# Patient Record
Sex: Male | Born: 1990 | Race: White | Hispanic: No | Marital: Single | Smoking: Former smoker
Health system: Southern US, Community
[De-identification: ages and names within clinical notes are randomized; demographics above are authoritative.]

## PROBLEM LIST (undated history)

## (undated) DIAGNOSIS — F1910 Other psychoactive substance abuse, uncomplicated: Secondary | ICD-10-CM

## (undated) DIAGNOSIS — F419 Anxiety disorder, unspecified: Secondary | ICD-10-CM

## (undated) DIAGNOSIS — F329 Major depressive disorder, single episode, unspecified: Secondary | ICD-10-CM

## (undated) DIAGNOSIS — F32A Depression, unspecified: Secondary | ICD-10-CM

## (undated) DIAGNOSIS — F410 Panic disorder [episodic paroxysmal anxiety] without agoraphobia: Secondary | ICD-10-CM

## (undated) DIAGNOSIS — F1110 Opioid abuse, uncomplicated: Secondary | ICD-10-CM

## (undated) DIAGNOSIS — T79A0XA Compartment syndrome, unspecified, initial encounter: Secondary | ICD-10-CM

## (undated) HISTORY — PX: OTHER SURGICAL HISTORY: SHX169

---
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
## 2009-05-21 ENCOUNTER — Emergency Department (HOSPITAL_COMMUNITY): Admission: EM | Admit: 2009-05-21 | Discharge: 2009-05-21 | Payer: Self-pay | Admitting: Emergency Medicine

## 2009-12-05 ENCOUNTER — Emergency Department (HOSPITAL_COMMUNITY): Admission: EM | Admit: 2009-12-05 | Discharge: 2009-12-06 | Payer: Self-pay | Admitting: Emergency Medicine

## 2011-01-08 LAB — CBC
Hemoglobin: 17.2 g/dL — ABNORMAL HIGH (ref 13.0–17.0)
MCHC: 34.8 g/dL (ref 30.0–36.0)
MCV: 90.8 fL (ref 78.0–100.0)
RBC: 5.44 MIL/uL (ref 4.22–5.81)
RDW: 13.1 % (ref 11.5–15.5)

## 2011-01-08 LAB — DIFFERENTIAL
Basophils Absolute: 0.2 10*3/uL — ABNORMAL HIGH (ref 0.0–0.1)
Basophils Relative: 2 % — ABNORMAL HIGH (ref 0–1)
Lymphocytes Relative: 25 % (ref 12–46)
Lymphs Abs: 2.8 10*3/uL (ref 0.7–4.0)
Monocytes Absolute: 0.8 10*3/uL (ref 0.1–1.0)
Neutro Abs: 7.3 10*3/uL (ref 1.7–7.7)
Neutrophils Relative %: 65 % (ref 43–77)

## 2011-01-08 LAB — RAPID URINE DRUG SCREEN, HOSP PERFORMED
Amphetamines: POSITIVE — AB
Cocaine: NOT DETECTED
Tetrahydrocannabinol: POSITIVE — AB

## 2011-01-08 LAB — BASIC METABOLIC PANEL
BUN: 11 mg/dL (ref 6–23)
Chloride: 103 mEq/L (ref 96–112)
Glucose, Bld: 100 mg/dL — ABNORMAL HIGH (ref 70–99)
Potassium: 3.8 mEq/L (ref 3.5–5.1)
Sodium: 139 mEq/L (ref 135–145)

## 2011-01-08 LAB — ETHANOL: Alcohol, Ethyl (B): <5 mg/dL (ref 0–10)

## 2011-02-11 ENCOUNTER — Emergency Department (HOSPITAL_COMMUNITY)
Admission: EM | Admit: 2011-02-11 | Discharge: 2011-02-11 | Disposition: A | Discharge status: Home / Self Care | Payer: BC Managed Care – PPO | Attending: Emergency Medicine | Admitting: Emergency Medicine

## 2011-02-11 DIAGNOSIS — F988 Other specified behavioral and emotional disorders with onset usually occurring in childhood and adolescence: Secondary | ICD-10-CM | POA: Insufficient documentation

## 2011-02-11 DIAGNOSIS — F411 Generalized anxiety disorder: Secondary | ICD-10-CM | POA: Insufficient documentation

## 2011-05-29 ENCOUNTER — Ambulatory Visit (INDEPENDENT_AMBULATORY_CARE_PROVIDER_SITE_OTHER): Payer: BC Managed Care – PPO

## 2011-05-29 ENCOUNTER — Inpatient Hospital Stay (INDEPENDENT_AMBULATORY_CARE_PROVIDER_SITE_OTHER)
Admission: RE | Admit: 2011-05-29 | Discharge: 2011-05-29 | Disposition: A | Discharge status: Home / Self Care | Payer: BC Managed Care – PPO | Source: Ambulatory Visit | Attending: Family Medicine | Admitting: Family Medicine

## 2011-05-29 DIAGNOSIS — L03119 Cellulitis of unspecified part of limb: Secondary | ICD-10-CM

## 2011-05-29 DIAGNOSIS — M545 Low back pain: Secondary | ICD-10-CM

## 2011-05-29 DIAGNOSIS — IMO0002 Reserved for concepts with insufficient information to code with codable children: Secondary | ICD-10-CM

## 2011-05-29 DIAGNOSIS — L02619 Cutaneous abscess of unspecified foot: Secondary | ICD-10-CM

## 2011-07-25 ENCOUNTER — Emergency Department (HOSPITAL_COMMUNITY): Payer: BC Managed Care – PPO

## 2011-07-25 ENCOUNTER — Emergency Department (HOSPITAL_COMMUNITY)
Admission: EM | Admit: 2011-07-25 | Discharge: 2011-07-25 | Disposition: A | Discharge status: Home / Self Care | Payer: BC Managed Care – PPO | Attending: Emergency Medicine | Admitting: Emergency Medicine

## 2011-07-25 DIAGNOSIS — S01501A Unspecified open wound of lip, initial encounter: Secondary | ICD-10-CM | POA: Insufficient documentation

## 2011-07-25 DIAGNOSIS — IMO0002 Reserved for concepts with insufficient information to code with codable children: Secondary | ICD-10-CM | POA: Insufficient documentation

## 2011-07-25 DIAGNOSIS — R51 Headache: Secondary | ICD-10-CM | POA: Insufficient documentation

## 2011-07-25 DIAGNOSIS — T1490XA Injury, unspecified, initial encounter: Secondary | ICD-10-CM | POA: Insufficient documentation

## 2011-07-25 DIAGNOSIS — R079 Chest pain, unspecified: Secondary | ICD-10-CM | POA: Insufficient documentation

## 2011-07-25 LAB — CBC
HCT: 45 % (ref 39.0–52.0)
Hemoglobin: 15.6 g/dL (ref 13.0–17.0)
MCV: 87.2 fL (ref 78.0–100.0)
RBC: 5.16 MIL/uL (ref 4.22–5.81)
RDW: 13.2 % (ref 11.5–15.5)
WBC: 9.6 10*3/uL (ref 4.0–10.5)

## 2011-09-21 ENCOUNTER — Encounter: Payer: Self-pay | Admitting: Emergency Medicine

## 2011-09-21 ENCOUNTER — Emergency Department (HOSPITAL_COMMUNITY)
Admission: EM | Admit: 2011-09-21 | Discharge: 2011-09-22 | Disposition: A | Discharge status: Home / Self Care | Payer: BC Managed Care – PPO | Attending: Emergency Medicine | Admitting: Emergency Medicine

## 2011-09-21 DIAGNOSIS — F1910 Other psychoactive substance abuse, uncomplicated: Secondary | ICD-10-CM | POA: Insufficient documentation

## 2011-09-21 DIAGNOSIS — F17200 Nicotine dependence, unspecified, uncomplicated: Secondary | ICD-10-CM | POA: Insufficient documentation

## 2011-09-21 HISTORY — DX: Major depressive disorder, single episode, unspecified: F32.9

## 2011-09-21 HISTORY — DX: Panic disorder (episodic paroxysmal anxiety): F41.0

## 2011-09-21 HISTORY — DX: Anxiety disorder, unspecified: F41.9

## 2011-09-21 HISTORY — DX: Depression, unspecified: F32.A

## 2011-09-21 LAB — CBC
HCT: 44.9 % (ref 39.0–52.0)
Hemoglobin: 16.1 g/dL (ref 13.0–17.0)
MCH: 30.1 pg (ref 26.0–34.0)
MCV: 83.9 fL (ref 78.0–100.0)
Platelets: 305 10*3/uL (ref 150–400)
RBC: 5.35 MIL/uL (ref 4.22–5.81)

## 2011-09-21 LAB — DIFFERENTIAL
Basophils Relative: 0 % (ref 0–1)
Eosinophils Absolute: 0.1 10*3/uL (ref 0.0–0.7)
Eosinophils Relative: 1 % (ref 0–5)
Neutro Abs: 9.2 10*3/uL — ABNORMAL HIGH (ref 1.7–7.7)

## 2011-09-21 LAB — URINALYSIS, ROUTINE W REFLEX MICROSCOPIC
Glucose, UA: NEGATIVE mg/dL
Hgb urine dipstick: NEGATIVE
Leukocytes, UA: NEGATIVE
Protein, ur: NEGATIVE mg/dL

## 2011-09-21 LAB — URINE MICROSCOPIC-ADD ON

## 2011-09-21 NOTE — ED Provider Notes (Signed)
History     CSN: [id number] Arrival date & time: 09/21/2011 10:36 PM   First MD Initiated Contact with Patient 09/21/11 2250      Chief Complaint  Patient presents with  . Medical Clearance    (Consider location/radiation/quality/duration/timing/severity/associated sxs/prior treatment) HPI Comments: Patient with a 6 month history of heroin use presents requesting detox. Patient states he uses 1 g of heroin per day, last use this morning.Marland Kitchen He also admits to shooting up cocaine, last use yesterday. Patient also uses marijuana and occasionally drinks alcohol. Patient describes nausea, vomiting, diarrhea, and flulike symptoms when withdrawing in the past. Patient denies suicidal or homicidal ideation currently. Denies any medical complaints.  Patient is a 20 y.o. male presenting with drug problem. The history is provided by the patient.  Drug Problem This is a chronic problem. The current episode started more than 1 month ago. The problem occurs daily. Pertinent negatives include no abdominal pain, chest pain, chills, congestion, fever, myalgias, nausea, neck pain, rash, sore throat or vomiting. The symptoms are aggravated by nothing. He has tried nothing for the symptoms.    Past Medical History  Diagnosis Date  . Depression   . Anxiety   . Panic attacks     Past Surgical History  Procedure Date  . Extraction of wisdom teeth     Family History  Problem Relation Age of Onset  . Hypertension Father     History  Substance Use Topics  . Smoking status: Current Everyday Smoker -- 1.0 packs/day    Types: Cigarettes  . Smokeless tobacco: Not on file  . Alcohol Use: Yes     rare      Review of Systems  Constitutional: Negative for fever and chills.  HENT: Negative for congestion, sore throat, rhinorrhea and neck pain.   Eyes: Negative for redness.  Respiratory: Negative for shortness of breath.   Cardiovascular: Negative for chest pain.  Gastrointestinal: Negative for  nausea, vomiting, abdominal pain, diarrhea and constipation.  Genitourinary: Negative for dysuria.  Musculoskeletal: Negative for myalgias.  Skin: Negative for rash.  Neurological: Negative for dizziness.  Psychiatric/Behavioral: Negative for suicidal ideas.    Allergies  Review of patient's allergies indicates no known allergies.  Home Medications  No current outpatient prescriptions on file.  BP 131/78  Pulse 100  Temp(Src) 98.2 F (36.8 C) (Oral)  Resp 20  SpO2 100%  Physical Exam  Nursing note and vitals reviewed. Constitutional: He is oriented to person, place, and time. He appears well-developed and well-nourished.  HENT:  Head: Normocephalic and atraumatic.  Eyes: Conjunctivae are normal. Right eye exhibits no discharge. Left eye exhibits no discharge.  Neck: Normal range of motion. Neck supple.  Cardiovascular: Normal rate, regular rhythm and normal heart sounds.   Pulmonary/Chest: Effort normal and breath sounds normal.  Abdominal: Soft. Bowel sounds are normal. There is no tenderness. There is no rebound and no guarding.  Musculoskeletal: He exhibits no edema.       Multiple needle marks on bilateral upper extremities.  Neurological: He is alert and oriented to person, place, and time.  Skin: Skin is warm and dry.  Psychiatric: He has a normal mood and affect.    ED Course  Procedures (including critical care time)  Labs Reviewed  CBC - Abnormal; Notable for the following:    WBC 12.9 (*)    All other components within normal limits  DIFFERENTIAL - Abnormal; Notable for the following:    Neutro Abs 9.2 (*)  Monocytes Absolute 1.2 (*)    All other components within normal limits  URINE RAPID DRUG SCREEN (HOSP PERFORMED) - Abnormal; Notable for the following:    Cocaine POSITIVE (*)    Tetrahydrocannabinol POSITIVE (*)    All other components within normal limits  URINALYSIS, ROUTINE W REFLEX MICROSCOPIC - Abnormal; Notable for the following:     APPearance TURBID (*)    All other components within normal limits  BASIC METABOLIC PANEL  ETHANOL  URINE MICROSCOPIC-ADD ON   No results found.   1. Polysubstance abuse     11:28 PM Patient seen and examined. Workup initiated.  1:10 AM patient informed of results. I've spoken with the ACT who will see patient.    MDM  Heroin and ETOH abuse. Medically cleared.         Eustace Moore [place], [place] 09/22/11 [phone]

## 2011-09-21 NOTE — ED Notes (Signed)
Pt states he is addicted to heroine  Pt states he has been using for about 6 months  Last used this morning  Pt states he has a slight headache  Pt denies SI/HI at this time

## 2011-09-22 DIAGNOSIS — F1910 Other psychoactive substance abuse, uncomplicated: Secondary | ICD-10-CM

## 2011-09-22 LAB — BASIC METABOLIC PANEL
Creatinine, Ser: 0.93 mg/dL (ref 0.50–1.35)
GFR calc Af Amer: >90 mL/min (ref >90)
Glucose, Bld: 91 mg/dL (ref 70–99)
Potassium: 3.7 mEq/L (ref 3.5–5.1)

## 2011-09-22 LAB — RAPID URINE DRUG SCREEN, HOSP PERFORMED: Opiates: NOT DETECTED

## 2011-09-22 MED ORDER — NICOTINE 21 MG/24HR TD PT24: 21.0000 mg | MEDICATED_PATCH | Freq: Every day | TRANSDERMAL | Status: DC

## 2011-09-22 MED ORDER — IBUPROFEN 600 MG PO TABS
600.0000 mg | ORAL_TABLET | Freq: Three times a day (TID) | ORAL | Status: DC | PRN
  Administered 2011-09-22: 600 mg via ORAL
  Filled 2011-09-22: qty 1

## 2011-09-22 MED ORDER — ONDANSETRON 4 MG PO TBDP
4.0000 mg | ORAL_TABLET | Freq: Four times a day (QID) | ORAL | Status: DC | PRN
  Administered 2011-09-22: 4 mg via ORAL
  Filled 2011-09-22: qty 1

## 2011-09-22 MED ORDER — ALUM & MAG HYDROXIDE-SIMETH 200-200-20 MG/5ML PO SUSP: 30.0000 mL | ORAL | Status: DC | PRN

## 2011-09-22 MED ORDER — CLONIDINE HCL 0.1 MG PO TABS: 0.1000 mg | ORAL_TABLET | ORAL | Status: DC

## 2011-09-22 MED ORDER — LOPERAMIDE HCL 2 MG PO CAPS: 2.0000 mg | ORAL_CAPSULE | ORAL | Status: DC | PRN

## 2011-09-22 MED ORDER — HYDROXYZINE HCL 25 MG PO TABS
25.0000 mg | ORAL_TABLET | Freq: Four times a day (QID) | ORAL | Status: DC | PRN
  Administered 2011-09-22 (×2): 25 mg via ORAL
  Filled 2011-09-22 (×2): qty 1

## 2011-09-22 MED ORDER — CLONIDINE HCL 0.1 MG PO TABS: 0.1000 mg | ORAL_TABLET | Freq: Every day | ORAL | Status: DC

## 2011-09-22 MED ORDER — METHOCARBAMOL 500 MG PO TABS
500.0000 mg | ORAL_TABLET | Freq: Three times a day (TID) | ORAL | Status: DC | PRN
  Filled 2011-09-22: qty 1

## 2011-09-22 MED ORDER — DICYCLOMINE HCL 20 MG PO TABS
20.0000 mg | ORAL_TABLET | ORAL | Status: DC | PRN
  Administered 2011-09-22: 20 mg via ORAL
  Filled 2011-09-22: qty 1

## 2011-09-22 MED ORDER — CLONIDINE HCL 0.1 MG PO TABS: 0.1000 mg | ORAL_TABLET | Freq: Four times a day (QID) | ORAL | Status: DC

## 2011-09-22 MED ORDER — ACETAMINOPHEN 325 MG PO TABS: 650.0000 mg | ORAL_TABLET | ORAL | Status: DC | PRN

## 2011-09-22 MED ORDER — LORAZEPAM 1 MG PO TABS: 1.0000 mg | ORAL_TABLET | Freq: Three times a day (TID) | ORAL | Status: DC | PRN

## 2011-09-22 MED ORDER — NAPROXEN 500 MG PO TABS
500.0000 mg | ORAL_TABLET | Freq: Two times a day (BID) | ORAL | Status: DC | PRN
  Administered 2011-09-22: 500 mg via ORAL
  Filled 2011-09-22: qty 1

## 2011-09-22 NOTE — ED Notes (Signed)
Pt homeless, living in car, drove self to ED to get help with Heroin detox, states he has been using for 6 months, amount varies and he wants to quit, reports first going across the street to the [facility] but sent over her to the ED d/t there were not any beds. Pt denies previous psych hx/detox tx, denies SI/HI/AH/VH, also reports using cocaine, MJ and benzos, pt med focused requesting suboxone and ativan, reports generalized pain 9/10, bentyl, naproxen, vistaril administered will f/u on effect.

## 2011-09-22 NOTE — Consult Note (Signed)
Patient Identification:  Martin Chapman Date of Evaluation:  09/22/2011   History of Present Illness:   I saw the patient and reviewed the [facility] assessment. 20 year old Caucasian male with history of polysubstance dependence abusing opioids cocaine marijuana. Patient wants to be on a Suboxone he don't want to get detox. I discussed with him that he has to go to the facility in the outpatient setting to be on a Suboxone. Patient also don't want to go to any rehabilitation at this time patient is very logical and goal-directed during the interview not hallucinating or delusional not suicidal or homicidal. Patient is alert awake oriented x3 attention concentration fair abstract ability fair insight and judgment intact. Patient wants to be discharged to followup in the outpatient clinic at this time patient does not meet criteria to be committed on IVC.  Psychoeducation given to the patient and I told her counselor to give him all the options available in the outpatient setting.   Past Medical History:     Past Medical History  Diagnosis Date  . Depression   . Anxiety   . Panic attacks        Past Surgical History  Procedure Date  . Extraction of wisdom teeth     Filed Vitals:   09/22/11 0842  BP: 104/60  Pulse: 66  Temp:   Resp:     Lab Results:   BMET    Component Value Date/Time   NA 140 09/21/2011 2315   K 3.7 09/21/2011 2315   CL 103 09/21/2011 2315   CO2 27 09/21/2011 2315   GLUCOSE 91 09/21/2011 2315   BUN 11 09/21/2011 2315   CREATININE 0.93 09/21/2011 2315   CALCIUM 9.8 09/21/2011 2315   GFRNONAA >90 09/21/2011 2315   GFRAA >90 09/21/2011 2315    Allergies: No Known Allergies  Current Medications:  Prior to Admission medications   Not on File    Social History:    reports that he has been smoking Cigarettes.  He has been smoking about 1 pack per day. He does not have any smokeless tobacco history on file. He reports that he drinks alcohol. He  reports that he uses illicit drugs (Cocaine).   Family History:    Family History  Problem Relation Age of Onset  . Hypertension Father      DIAGNOSIS:   AXIS I  polysubstance dependence   AXIS II  Deffered  AXIS III See medical notes.  AXIS IV  substance abuse   AXIS V 50     Recommendations:  Patient can be discharged to followup in the outpatient setting.   Eulogio Ditch, MD

## 2011-09-22 NOTE — ED Notes (Signed)
Pt in room, with pants off, noticed he still had his boxer shorts on, pt advised of the rules that he should not have any of his belongings in his room, pt cooperative and gave staff boxer shorts to place with other belongings.

## 2011-09-22 NOTE — ED Provider Notes (Signed)
9:08 AM Patient has been seen and cleared by the psychiatrist, Dr. Rogers Blocker. He is given outpatient referrals. He has no suicidal or homicidal thoughts. Patient was here voluntarily. He is stable for discharge at this time  Theron Arista A. Patrica Duel, MD 09/22/11 [phone]
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
## 2011-09-23 NOTE — ED Provider Notes (Signed)
Medical screening examination/treatment/procedure(s) were performed by non-physician practitioner and as supervising physician I was immediately available for consultation/collaboration.   Vida Roller, MD 09/23/11 [phone]

## 2012-04-08 ENCOUNTER — Emergency Department (HOSPITAL_COMMUNITY): Payer: BC Managed Care – PPO

## 2012-04-08 ENCOUNTER — Encounter (HOSPITAL_COMMUNITY): Payer: Self-pay | Admitting: Emergency Medicine

## 2012-04-08 ENCOUNTER — Emergency Department (HOSPITAL_COMMUNITY)
Admission: EM | Admit: 2012-04-08 | Discharge: 2012-04-09 | Disposition: A | Discharge status: Home / Self Care | Payer: BC Managed Care – PPO | Attending: Emergency Medicine | Admitting: Emergency Medicine

## 2012-04-08 DIAGNOSIS — T07XXXA Unspecified multiple injuries, initial encounter: Secondary | ICD-10-CM

## 2012-04-08 DIAGNOSIS — F17200 Nicotine dependence, unspecified, uncomplicated: Secondary | ICD-10-CM | POA: Insufficient documentation

## 2012-04-08 DIAGNOSIS — S060X9A Concussion with loss of consciousness of unspecified duration, initial encounter: Secondary | ICD-10-CM

## 2012-04-08 DIAGNOSIS — M542 Cervicalgia: Secondary | ICD-10-CM | POA: Insufficient documentation

## 2012-04-08 DIAGNOSIS — Y9289 Other specified places as the place of occurrence of the external cause: Secondary | ICD-10-CM | POA: Insufficient documentation

## 2012-04-08 DIAGNOSIS — S060X0A Concussion without loss of consciousness, initial encounter: Secondary | ICD-10-CM | POA: Insufficient documentation

## 2012-04-08 DIAGNOSIS — M25559 Pain in unspecified hip: Secondary | ICD-10-CM | POA: Insufficient documentation

## 2012-04-08 DIAGNOSIS — M25569 Pain in unspecified knee: Secondary | ICD-10-CM | POA: Insufficient documentation

## 2012-04-08 DIAGNOSIS — R1011 Right upper quadrant pain: Secondary | ICD-10-CM | POA: Insufficient documentation

## 2012-04-08 DIAGNOSIS — F341 Dysthymic disorder: Secondary | ICD-10-CM | POA: Insufficient documentation

## 2012-04-08 DIAGNOSIS — R51 Headache: Secondary | ICD-10-CM | POA: Insufficient documentation

## 2012-04-08 LAB — COMPREHENSIVE METABOLIC PANEL
BUN: 13 mg/dL (ref 6–23)
Calcium: 9.9 mg/dL (ref 8.4–10.5)
GFR calc Af Amer: >90 mL/min (ref >90)
Glucose, Bld: 118 mg/dL — ABNORMAL HIGH (ref 70–99)
Total Protein: 7.7 g/dL (ref 6.0–8.3)

## 2012-04-08 LAB — CBC WITH DIFFERENTIAL/PLATELET
Basophils Relative: 0 % (ref 0–1)
Eosinophils Relative: 0 % (ref 0–5)
Hemoglobin: 15.9 g/dL (ref 13.0–17.0)
Lymphs Abs: 2.3 10*3/uL (ref 0.7–4.0)
MCH: 29.5 pg (ref 26.0–34.0)
MCV: 84.8 fL (ref 78.0–100.0)
Monocytes Absolute: 1.5 10*3/uL — ABNORMAL HIGH (ref 0.1–1.0)
RBC: 5.39 MIL/uL (ref 4.22–5.81)

## 2012-04-08 LAB — RAPID URINE DRUG SCREEN, HOSP PERFORMED
Barbiturates: NOT DETECTED
Tetrahydrocannabinol: POSITIVE — AB

## 2012-04-08 LAB — LIPASE, BLOOD: Lipase: 14 U/L (ref 11–59)

## 2012-04-08 LAB — URINALYSIS, ROUTINE W REFLEX MICROSCOPIC
Bilirubin Urine: NEGATIVE
Nitrite: NEGATIVE
Specific Gravity, Urine: >1.046 — ABNORMAL HIGH (ref 1.005–1.030)
Urobilinogen, UA: 0.2 mg/dL (ref 0.0–1.0)

## 2012-04-08 LAB — ETHANOL: Alcohol, Ethyl (B): <11 mg/dL (ref 0–11)

## 2012-04-08 MED ORDER — FENTANYL CITRATE 0.05 MG/ML IJ SOLN
100.0000 ug | Freq: Once | INTRAMUSCULAR | Status: AC
  Administered 2012-04-08: 100 ug via INTRAVENOUS
  Filled 2012-04-08: qty 2

## 2012-04-08 MED ORDER — IOHEXOL 300 MG/ML  SOLN
100.0000 mL | Freq: Once | INTRAMUSCULAR | Status: AC | PRN
  Administered 2012-04-08: 100 mL via INTRAVENOUS

## 2012-04-08 MED ORDER — SODIUM CHLORIDE 0.9 % IV SOLN: INTRAVENOUS | Status: DC

## 2012-04-08 MED ORDER — SODIUM CHLORIDE 0.9 % IV BOLUS (SEPSIS)
1000.0000 mL | Freq: Once | INTRAVENOUS | Status: AC
  Administered 2012-04-08: 1000 mL via INTRAVENOUS

## 2012-04-08 NOTE — ED Notes (Signed)
Pt returned from CT, NT at bedside, attempting to help pt urinate in urinal. Pt medicated for pain per MD order, family at bedside.

## 2012-04-08 NOTE — ED Notes (Signed)
Pt requesting addt pain medication and food
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
## 2012-04-08 NOTE — ED Notes (Signed)
Father at bedside.

## 2012-04-08 NOTE — ED Notes (Signed)
Pt remains in CT at this time.  
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
## 2012-07-22 ENCOUNTER — Emergency Department (HOSPITAL_COMMUNITY): Payer: BC Managed Care – PPO

## 2012-07-22 ENCOUNTER — Inpatient Hospital Stay (HOSPITAL_COMMUNITY): Payer: BC Managed Care – PPO | Admitting: Anesthesiology

## 2012-07-22 ENCOUNTER — Encounter (HOSPITAL_COMMUNITY): Payer: Self-pay | Admitting: Anesthesiology

## 2012-07-22 ENCOUNTER — Inpatient Hospital Stay (HOSPITAL_COMMUNITY)
Admission: EM | Admit: 2012-07-22 | Discharge: 2012-08-20 | DRG: 583 | Disposition: A | Discharge status: Home / Self Care | Payer: BC Managed Care – PPO | Attending: Internal Medicine | Admitting: Internal Medicine

## 2012-07-22 ENCOUNTER — Encounter (HOSPITAL_COMMUNITY)
Admission: EM | Disposition: A | Discharge status: Home / Self Care | Payer: Self-pay | Source: Home / Self Care | Attending: Internal Medicine

## 2012-07-22 DIAGNOSIS — T510X1A Toxic effect of ethanol, accidental (unintentional), initial encounter: Secondary | ICD-10-CM | POA: Diagnosis present

## 2012-07-22 DIAGNOSIS — G9340 Encephalopathy, unspecified: Secondary | ICD-10-CM | POA: Diagnosis present

## 2012-07-22 DIAGNOSIS — F17200 Nicotine dependence, unspecified, uncomplicated: Secondary | ICD-10-CM | POA: Diagnosis present

## 2012-07-22 DIAGNOSIS — T794XXA Traumatic shock, initial encounter: Secondary | ICD-10-CM | POA: Diagnosis present

## 2012-07-22 DIAGNOSIS — F1010 Alcohol abuse, uncomplicated: Secondary | ICD-10-CM | POA: Diagnosis present

## 2012-07-22 DIAGNOSIS — F11929 Opioid use, unspecified with intoxication, unspecified: Secondary | ICD-10-CM

## 2012-07-22 DIAGNOSIS — D649 Anemia, unspecified: Secondary | ICD-10-CM | POA: Diagnosis not present

## 2012-07-22 DIAGNOSIS — R7989 Other specified abnormal findings of blood chemistry: Secondary | ICD-10-CM

## 2012-07-22 DIAGNOSIS — M6282 Rhabdomyolysis: Secondary | ICD-10-CM | POA: Diagnosis present

## 2012-07-22 DIAGNOSIS — S20219A Contusion of unspecified front wall of thorax, initial encounter: Secondary | ICD-10-CM | POA: Diagnosis present

## 2012-07-22 DIAGNOSIS — R6520 Severe sepsis without septic shock: Secondary | ICD-10-CM | POA: Diagnosis present

## 2012-07-22 DIAGNOSIS — R7309 Other abnormal glucose: Secondary | ICD-10-CM | POA: Diagnosis not present

## 2012-07-22 DIAGNOSIS — X58XXXA Exposure to other specified factors, initial encounter: Secondary | ICD-10-CM | POA: Diagnosis present

## 2012-07-22 DIAGNOSIS — IMO0002 Reserved for concepts with insufficient information to code with codable children: Secondary | ICD-10-CM | POA: Diagnosis not present

## 2012-07-22 DIAGNOSIS — T79A19A Traumatic compartment syndrome of unspecified upper extremity, initial encounter: Secondary | ICD-10-CM

## 2012-07-22 DIAGNOSIS — T17800A Unspecified foreign body in other parts of respiratory tract causing asphyxiation, initial encounter: Secondary | ICD-10-CM

## 2012-07-22 DIAGNOSIS — I10 Essential (primary) hypertension: Secondary | ICD-10-CM | POA: Diagnosis present

## 2012-07-22 DIAGNOSIS — G929 Unspecified toxic encephalopathy: Secondary | ICD-10-CM | POA: Diagnosis present

## 2012-07-22 DIAGNOSIS — T401X1A Poisoning by heroin, accidental (unintentional), initial encounter: Secondary | ICD-10-CM | POA: Diagnosis present

## 2012-07-22 DIAGNOSIS — E872 Acidosis: Secondary | ICD-10-CM

## 2012-07-22 DIAGNOSIS — E162 Hypoglycemia, unspecified: Secondary | ICD-10-CM

## 2012-07-22 DIAGNOSIS — Y929 Unspecified place or not applicable: Secondary | ICD-10-CM

## 2012-07-22 DIAGNOSIS — F919 Conduct disorder, unspecified: Secondary | ICD-10-CM | POA: Diagnosis present

## 2012-07-22 DIAGNOSIS — S45809A Unspecified injury of other specified blood vessels at shoulder and upper arm level, unspecified arm, initial encounter: Secondary | ICD-10-CM | POA: Diagnosis present

## 2012-07-22 DIAGNOSIS — G92 Toxic encephalopathy: Secondary | ICD-10-CM | POA: Diagnosis present

## 2012-07-22 DIAGNOSIS — Z781 Physical restraint status: Secondary | ICD-10-CM | POA: Diagnosis not present

## 2012-07-22 DIAGNOSIS — R680 Hypothermia, not associated with low environmental temperature: Secondary | ICD-10-CM | POA: Diagnosis present

## 2012-07-22 DIAGNOSIS — I952 Hypotension due to drugs: Secondary | ICD-10-CM

## 2012-07-22 DIAGNOSIS — F1210 Cannabis abuse, uncomplicated: Secondary | ICD-10-CM | POA: Diagnosis present

## 2012-07-22 DIAGNOSIS — F1410 Cocaine abuse, uncomplicated: Secondary | ICD-10-CM | POA: Diagnosis present

## 2012-07-22 DIAGNOSIS — A0472 Enterocolitis due to Clostridium difficile, not specified as recurrent: Secondary | ICD-10-CM | POA: Diagnosis not present

## 2012-07-22 DIAGNOSIS — T510X4A Toxic effect of ethanol, undetermined, initial encounter: Secondary | ICD-10-CM | POA: Diagnosis present

## 2012-07-22 DIAGNOSIS — T50911A Poisoning by multiple unspecified drugs, medicaments and biological substances, accidental (unintentional), initial encounter: Secondary | ICD-10-CM

## 2012-07-22 DIAGNOSIS — J690 Pneumonitis due to inhalation of food and vomit: Secondary | ICD-10-CM | POA: Diagnosis present

## 2012-07-22 DIAGNOSIS — R6521 Severe sepsis with septic shock: Secondary | ICD-10-CM | POA: Diagnosis present

## 2012-07-22 DIAGNOSIS — R4182 Altered mental status, unspecified: Secondary | ICD-10-CM

## 2012-07-22 DIAGNOSIS — F410 Panic disorder [episodic paroxysmal anxiety] without agoraphobia: Secondary | ICD-10-CM | POA: Diagnosis present

## 2012-07-22 DIAGNOSIS — J95821 Acute postprocedural respiratory failure: Secondary | ICD-10-CM | POA: Diagnosis not present

## 2012-07-22 DIAGNOSIS — F10929 Alcohol use, unspecified with intoxication, unspecified: Secondary | ICD-10-CM

## 2012-07-22 DIAGNOSIS — A419 Sepsis, unspecified organism: Secondary | ICD-10-CM | POA: Diagnosis present

## 2012-07-22 DIAGNOSIS — F1510 Other stimulant abuse, uncomplicated: Secondary | ICD-10-CM | POA: Diagnosis present

## 2012-07-22 DIAGNOSIS — F1120 Opioid dependence, uncomplicated: Secondary | ICD-10-CM | POA: Diagnosis present

## 2012-07-22 DIAGNOSIS — D696 Thrombocytopenia, unspecified: Secondary | ICD-10-CM | POA: Diagnosis not present

## 2012-07-22 DIAGNOSIS — N170 Acute kidney failure with tubular necrosis: Secondary | ICD-10-CM | POA: Diagnosis present

## 2012-07-22 DIAGNOSIS — F39 Unspecified mood [affective] disorder: Secondary | ICD-10-CM | POA: Diagnosis present

## 2012-07-22 DIAGNOSIS — E875 Hyperkalemia: Secondary | ICD-10-CM | POA: Diagnosis present

## 2012-07-22 DIAGNOSIS — N179 Acute kidney failure, unspecified: Secondary | ICD-10-CM

## 2012-07-22 DIAGNOSIS — R748 Abnormal levels of other serum enzymes: Secondary | ICD-10-CM | POA: Diagnosis not present

## 2012-07-22 DIAGNOSIS — Z23 Encounter for immunization: Secondary | ICD-10-CM

## 2012-07-22 DIAGNOSIS — B370 Candidal stomatitis: Secondary | ICD-10-CM | POA: Diagnosis not present

## 2012-07-22 DIAGNOSIS — E876 Hypokalemia: Secondary | ICD-10-CM | POA: Diagnosis not present

## 2012-07-22 DIAGNOSIS — T401X4A Poisoning by heroin, undetermined, initial encounter: Principal | ICD-10-CM | POA: Diagnosis present

## 2012-07-22 DIAGNOSIS — W449XXA Unspecified foreign body entering into or through a natural orifice, initial encounter: Secondary | ICD-10-CM | POA: Diagnosis present

## 2012-07-22 DIAGNOSIS — K7200 Acute and subacute hepatic failure without coma: Secondary | ICD-10-CM | POA: Diagnosis present

## 2012-07-22 HISTORY — PX: INTRAOPERATIVE ARTERIOGRAM: SHX5157

## 2012-07-22 HISTORY — PX: FASCIOTOMY: SHX132

## 2012-07-22 HISTORY — PX: INSERTION OF DIALYSIS CATHETER: SHX1324

## 2012-07-22 LAB — CBC WITH DIFFERENTIAL/PLATELET
Basophils Relative: 0 % (ref 0–1)
Eosinophils Absolute: 0 10*3/uL (ref 0.0–0.7)
MCH: 30.3 pg (ref 26.0–34.0)
MCHC: 35.6 g/dL (ref 30.0–36.0)
Neutrophils Relative %: 87 % — ABNORMAL HIGH (ref 43–77)
Platelets: 232 10*3/uL (ref 150–400)
RBC: 5.21 MIL/uL (ref 4.22–5.81)

## 2012-07-22 LAB — COMPREHENSIVE METABOLIC PANEL
ALT: 462 U/L — ABNORMAL HIGH (ref 0–53)
ALT: 468 U/L — ABNORMAL HIGH (ref 0–53)
AST: 588 U/L — ABNORMAL HIGH (ref 0–37)
Albumin: 4.7 g/dL (ref 3.5–5.2)
Alkaline Phosphatase: 226 U/L — ABNORMAL HIGH (ref 39–117)
CO2: 14 mEq/L — ABNORMAL LOW (ref 19–32)
Calcium: 8.2 mg/dL — ABNORMAL LOW (ref 8.4–10.5)
Chloride: 100 mEq/L (ref 96–112)
Chloride: 101 mEq/L (ref 96–112)
Creatinine, Ser: 2.45 mg/dL — ABNORMAL HIGH (ref 0.50–1.35)
GFR calc Af Amer: 38 mL/min — ABNORMAL LOW (ref >90)
GFR calc non Af Amer: 35 mL/min — ABNORMAL LOW (ref >90)
Glucose, Bld: 45 mg/dL — ABNORMAL LOW (ref 70–99)
Potassium: >7.5 mEq/L (ref 3.5–5.1)
Sodium: 144 mEq/L (ref 135–145)
Sodium: 140 mEq/L (ref 135–145)
Total Bilirubin: 0.9 mg/dL (ref 0.3–1.2)
Total Bilirubin: 0.8 mg/dL (ref 0.3–1.2)
Total Protein: 6.5 g/dL (ref 6.0–8.3)

## 2012-07-22 LAB — PROTIME-INR
INR: 2.01 — ABNORMAL HIGH (ref 0.00–1.49)
Prothrombin Time: 22 seconds — ABNORMAL HIGH (ref 11.6–15.2)

## 2012-07-22 LAB — LACTIC ACID, PLASMA: Lactic Acid, Venous: 8.2 mmol/L — ABNORMAL HIGH (ref 0.5–2.2)

## 2012-07-22 LAB — RAPID URINE DRUG SCREEN, HOSP PERFORMED
Amphetamines: NOT DETECTED
Barbiturates: NOT DETECTED
Cocaine: NOT DETECTED
Opiates: POSITIVE — AB
Tetrahydrocannabinol: NOT DETECTED

## 2012-07-22 LAB — AMYLASE: Amylase: 312 U/L — ABNORMAL HIGH (ref 0–105)

## 2012-07-22 LAB — URINALYSIS, ROUTINE W REFLEX MICROSCOPIC
Glucose, UA: NEGATIVE mg/dL
Hgb urine dipstick: NEGATIVE
Leukocytes, UA: NEGATIVE
Specific Gravity, Urine: 1.007 (ref 1.005–1.030)
pH: 5.5 (ref 5.0–8.0)

## 2012-07-22 LAB — CBC
MCV: 85.4 fL (ref 78.0–100.0)
Platelets: 279 10*3/uL (ref 150–400)
RBC: 5.91 MIL/uL — ABNORMAL HIGH (ref 4.22–5.81)
RDW: 13.3 % (ref 11.5–15.5)
WBC: 37.8 10*3/uL — ABNORMAL HIGH (ref 4.0–10.5)

## 2012-07-22 LAB — GLUCOSE, CAPILLARY: Glucose-Capillary: 159 mg/dL — ABNORMAL HIGH (ref 70–99)

## 2012-07-22 LAB — POCT I-STAT 7, (LYTES, BLD GAS, ICA,H+H)
Acid-base deficit: 11 mmol/L — ABNORMAL HIGH (ref 0.0–2.0)
Bicarbonate: 17.7 mEq/L — ABNORMAL LOW (ref 20.0–24.0)
Calcium, Ion: 0.93 mmol/L — ABNORMAL LOW (ref 1.12–1.23)
HCT: 38 % — ABNORMAL LOW (ref 39.0–52.0)
O2 Saturation: 90 %
Sodium: 142 mEq/L (ref 135–145)

## 2012-07-22 LAB — CK TOTAL AND CKMB (NOT AT ARMC): Total CK: >50000 U/L — ABNORMAL HIGH (ref 7–232)

## 2012-07-22 LAB — TROPONIN I: Troponin I: 0.62 ng/mL (ref <0.30)

## 2012-07-22 SURGERY — INSERTION OF DIALYSIS CATHETER
Anesthesia: General | Site: Groin | Laterality: Right | Wound class: Clean

## 2012-07-22 MED ORDER — SODIUM BICARBONATE 8.4 % IV SOLN
INTRAVENOUS | Status: DC | PRN
  Administered 2012-07-22: 100 meq via INTRAVENOUS
  Administered 2012-07-22: 150 meq via INTRAVENOUS

## 2012-07-22 MED ORDER — BIOTENE DRY MOUTH MT LIQD
15.0000 mL | Freq: Four times a day (QID) | OROMUCOSAL | Status: DC
  Administered 2012-07-23 – 2012-07-29 (×26): 15 mL via OROMUCOSAL

## 2012-07-22 MED ORDER — ONDANSETRON HCL 4 MG/2ML IJ SOLN
INTRAMUSCULAR | Status: DC | PRN
  Administered 2012-07-22: 4 mg via INTRAVENOUS

## 2012-07-22 MED ORDER — HEPARIN SODIUM (PORCINE) 5000 UNIT/ML IJ SOLN: 5000.0000 [IU] | Freq: Three times a day (TID) | INTRAMUSCULAR | Status: DC

## 2012-07-22 MED ORDER — CHLORHEXIDINE GLUCONATE 0.12 % MT SOLN
15.0000 mL | Freq: Two times a day (BID) | OROMUCOSAL | Status: DC
  Administered 2012-07-23 – 2012-07-29 (×13): 15 mL via OROMUCOSAL
  Filled 2012-07-22 (×12): qty 15

## 2012-07-22 MED ORDER — CALCIUM CHLORIDE 10 % IV SOLN
INTRAVENOUS | Status: DC | PRN
  Administered 2012-07-22 (×2): 1 g via INTRAVENOUS

## 2012-07-22 MED ORDER — PANTOPRAZOLE SODIUM 40 MG IV SOLR
40.0000 mg | Freq: Every day | INTRAVENOUS | Status: DC
  Administered 2012-07-23 – 2012-07-24 (×2): 40 mg via INTRAVENOUS
  Filled 2012-07-22 (×4): qty 40

## 2012-07-22 MED ORDER — LIDOCAINE HCL (CARDIAC) 20 MG/ML IV SOLN
INTRAVENOUS | Status: DC | PRN
  Administered 2012-07-22: 100 mg via INTRAVENOUS

## 2012-07-22 MED ORDER — ONDANSETRON HCL 4 MG/2ML IJ SOLN
INTRAMUSCULAR | Status: AC
  Filled 2012-07-22: qty 2

## 2012-07-22 MED ORDER — DEXTROSE 5 % IV SOLN
INTRAVENOUS | Status: AC
  Filled 2012-07-22: qty 250

## 2012-07-22 MED ORDER — NALOXONE HCL 0.4 MG/ML IJ SOLN
0.4000 mg | Freq: Once | INTRAMUSCULAR | Status: AC
  Administered 2012-07-22: 0.4 mg via INTRAVENOUS
  Filled 2012-07-22 (×2): qty 1

## 2012-07-22 MED ORDER — NOREPINEPHRINE BITARTRATE 1 MG/ML IJ SOLN
2.0000 ug/min | INTRAVENOUS | Status: DC
  Filled 2012-07-22: qty 4

## 2012-07-22 MED ORDER — INSULIN ASPART 100 UNIT/ML ~~LOC~~ SOLN
10.0000 [IU] | Freq: Once | SUBCUTANEOUS | Status: DC
  Filled 2012-07-22: qty 3

## 2012-07-22 MED ORDER — DEXTROSE 50 % IV SOLN
INTRAVENOUS | Status: DC | PRN
  Administered 2012-07-22: 25 g via INTRAVENOUS

## 2012-07-22 MED ORDER — SODIUM CHLORIDE 0.9 % IV SOLN
INTRAVENOUS | Status: DC | PRN
  Administered 2012-07-22: 22:00:00 via INTRAVENOUS

## 2012-07-22 MED ORDER — ROCURONIUM BROMIDE 100 MG/10ML IV SOLN
INTRAVENOUS | Status: DC | PRN
  Administered 2012-07-22: 50 mg via INTRAVENOUS
  Administered 2012-07-22: 100 mg via INTRAVENOUS

## 2012-07-22 MED ORDER — DEXTROSE 50 % IV SOLN
1.0000 | Freq: Once | INTRAVENOUS | Status: AC
  Administered 2012-07-22: 50 mL via INTRAVENOUS
  Filled 2012-07-22: qty 50

## 2012-07-22 MED ORDER — ARTIFICIAL TEARS OP OINT
TOPICAL_OINTMENT | OPHTHALMIC | Status: DC | PRN
  Administered 2012-07-22: 1 via OPHTHALMIC

## 2012-07-22 MED ORDER — SODIUM CHLORIDE 0.9 % IV SOLN
3.0000 g | Freq: Once | INTRAVENOUS | Status: DC
  Filled 2012-07-22: qty 3

## 2012-07-22 MED ORDER — ETOMIDATE 2 MG/ML IV SOLN
INTRAVENOUS | Status: DC | PRN
  Administered 2012-07-22: 20 mg via INTRAVENOUS

## 2012-07-22 MED ORDER — SODIUM POLYSTYRENE SULFONATE 15 GM/60ML PO SUSP
60.0000 g | ORAL | Status: AC
  Filled 2012-07-22: qty 240

## 2012-07-22 MED ORDER — SODIUM CHLORIDE 0.9 % IV BOLUS (SEPSIS)
1000.0000 mL | Freq: Once | INTRAVENOUS | Status: AC
  Administered 2012-07-22: 1000 mL via INTRAVENOUS

## 2012-07-22 MED ORDER — NOREPINEPHRINE BITARTRATE 1 MG/ML IJ SOLN
4000.0000 ug | INTRAVENOUS | Status: DC | PRN
  Administered 2012-07-22: 15 ug/min via INTRAVENOUS

## 2012-07-22 MED ORDER — MIDAZOLAM HCL 5 MG/5ML IJ SOLN
INTRAMUSCULAR | Status: DC | PRN
  Administered 2012-07-22: 4 mg via INTRAVENOUS

## 2012-07-22 MED ORDER — NALOXONE HCL 0.4 MG/ML IJ SOLN
0.4000 mg | INTRAMUSCULAR | Status: DC | PRN
  Administered 2012-07-22 (×3): 0.4 mg via INTRAVENOUS
  Filled 2012-07-22 (×2): qty 1

## 2012-07-22 MED ORDER — VANCOMYCIN HCL IN DEXTROSE 1-5 GM/200ML-% IV SOLN
1000.0000 mg | INTRAVENOUS | Status: DC
  Administered 2012-07-22 – 2012-07-23 (×2): 1000 mg via INTRAVENOUS
  Filled 2012-07-22 (×3): qty 200

## 2012-07-22 MED ORDER — FENTANYL CITRATE 0.05 MG/ML IJ SOLN
INTRAMUSCULAR | Status: DC | PRN
  Administered 2012-07-22 (×2): 150 ug via INTRAVENOUS
  Administered 2012-07-22: 100 ug via INTRAVENOUS
  Administered 2012-07-22: 250 ug via INTRAVENOUS

## 2012-07-22 MED ORDER — CALCIUM GLUCONATE 10 % IV SOLN
1.0000 g | Freq: Once | INTRAVENOUS | Status: AC
  Administered 2012-07-23: 1 g via INTRAVENOUS
  Filled 2012-07-22: qty 10

## 2012-07-22 MED ORDER — PIPERACILLIN-TAZOBACTAM 3.375 G IVPB
3.3750 g | Freq: Three times a day (TID) | INTRAVENOUS | Status: DC
  Administered 2012-07-23: 3.375 g via INTRAVENOUS
  Filled 2012-07-22 (×3): qty 50

## 2012-07-22 MED ORDER — SODIUM CHLORIDE 0.9 % IV SOLN
3.0000 g | INTRAVENOUS | Status: DC | PRN
  Administered 2012-07-22: 3 g via INTRAVENOUS

## 2012-07-22 MED ORDER — SODIUM BICARBONATE 8.4 % IV SOLN
INTRAVENOUS | Status: DC
  Filled 2012-07-22 (×10): qty 150

## 2012-07-22 MED ORDER — INSULIN REGULAR HUMAN 100 UNIT/ML IJ SOLN
10.0000 [IU] | Freq: Once | INTRAMUSCULAR | Status: DC
  Filled 2012-07-22: qty 0.1

## 2012-07-22 MED ORDER — NOREPINEPHRINE BITARTRATE 1 MG/ML IJ SOLN
INTRAMUSCULAR | Status: AC
  Administered 2012-07-22: 4 mg
  Filled 2012-07-22: qty 4

## 2012-07-22 MED ORDER — INSULIN ASPART 100 UNIT/ML ~~LOC~~ SOLN
SUBCUTANEOUS | Status: DC | PRN
  Administered 2012-07-22: 20 [IU] via SUBCUTANEOUS

## 2012-07-22 MED ORDER — 0.9 % SODIUM CHLORIDE (POUR BTL) OPTIME
TOPICAL | Status: DC | PRN
  Administered 2012-07-22: 1000 mL

## 2012-07-22 MED ORDER — DEXTROSE 50 % IV SOLN
1.0000 | Freq: Once | INTRAVENOUS | Status: DC
  Filled 2012-07-22: qty 50

## 2012-07-22 MED ORDER — IOHEXOL 300 MG/ML  SOLN
INTRAMUSCULAR | Status: DC | PRN
  Administered 2012-07-22: 73 mL via INTRAVENOUS

## 2012-07-22 MED ORDER — MIDAZOLAM HCL 2 MG/2ML IJ SOLN
2.0000 mg | INTRAMUSCULAR | Status: DC | PRN
  Administered 2012-07-23: 4 mg via INTRAVENOUS
  Filled 2012-07-22: qty 4

## 2012-07-22 MED ORDER — SODIUM CHLORIDE 0.9 % IV SOLN
250.0000 mL | INTRAVENOUS | Status: DC | PRN
  Administered 2012-07-23: 250 mL via INTRAVENOUS

## 2012-07-22 MED ORDER — HEPARIN 6000 UNITS/NORMAL SALINE 500 ML OPTIME IRRIGATION
Status: DC | PRN
  Administered 2012-07-22: 1 via INTRAVASCULAR

## 2012-07-22 MED ORDER — SODIUM CHLORIDE 0.9 % IV BOLUS (SEPSIS): 1000.0000 mL | Freq: Once | INTRAVENOUS | Status: DC

## 2012-07-22 MED ORDER — MIDAZOLAM HCL 2 MG/2ML IJ SOLN
2.0000 mg | Freq: Once | INTRAMUSCULAR | Status: AC
  Administered 2012-07-23: 4 mg via INTRAVENOUS
  Filled 2012-07-22: qty 4

## 2012-07-22 SURGICAL SUPPLY — 55 items
BAG BANDED W/RUBBER/TAPE 36X54 (MISCELLANEOUS) ×4 IMPLANT
BAG EQP BAND 135X91 W/RBR TAPE (MISCELLANEOUS) ×3
BANDAGE ELASTIC 4 VELCRO ST LF (GAUZE/BANDAGES/DRESSINGS) ×4 IMPLANT
BANDAGE ELASTIC 6 VELCRO ST LF (GAUZE/BANDAGES/DRESSINGS) ×8 IMPLANT
BANDAGE GAUZE ELAST BULKY 4 IN (GAUZE/BANDAGES/DRESSINGS) ×12 IMPLANT
BLADE SURG 10 STRL SS (BLADE) ×4 IMPLANT
CANISTER SUCTION 2500CC (MISCELLANEOUS) ×4 IMPLANT
CATH ACCU-VU SIZ PIG 5F 100CM (CATHETERS) ×4 IMPLANT
CATH ANGIO 5F BER2 100CM (CATHETERS) ×4 IMPLANT
CATH TRIALYSIS 20CM 13F 3LUM (IV SETS) ×4 IMPLANT
CLOTH BEACON ORANGE TIMEOUT ST (SAFETY) ×4 IMPLANT
COVER PROBE W GEL 5X96 (DRAPES) ×8 IMPLANT
COVER SURGICAL LIGHT HANDLE (MISCELLANEOUS) ×4 IMPLANT
DRAPE ORTHO SPLIT 77X108 STRL (DRAPES) ×8
DRAPE PROXIMA HALF (DRAPES) ×4 IMPLANT
DRAPE SURG ORHT 6 SPLT 77X108 (DRAPES) ×6 IMPLANT
DRAPE U-SHAPE 47X51 STRL (DRAPES) ×4 IMPLANT
DRSG ADAPTIC 3X8 NADH LF (GAUZE/BANDAGES/DRESSINGS) ×4 IMPLANT
DRSG PAD ABDOMINAL 8X10 ST (GAUZE/BANDAGES/DRESSINGS) ×4 IMPLANT
DRSG TEGADERM 4X4.75 (GAUZE/BANDAGES/DRESSINGS) ×4 IMPLANT
DURAPREP 26ML APPLICATOR (WOUND CARE) ×4 IMPLANT
ELECT REM PT RETURN 9FT ADLT (ELECTROSURGICAL) ×4
ELECTRODE REM PT RTRN 9FT ADLT (ELECTROSURGICAL) ×3 IMPLANT
GAUZE SPONGE 2X2 8PLY STRL LF (GAUZE/BANDAGES/DRESSINGS) ×6 IMPLANT
GLOVE BIO SURGEON STRL SZ7.5 (GLOVE) ×4 IMPLANT
GLOVE BIOGEL PI IND STRL 7.0 (GLOVE) ×3 IMPLANT
GLOVE BIOGEL PI IND STRL 9 (GLOVE) ×3 IMPLANT
GLOVE BIOGEL PI INDICATOR 7.0 (GLOVE) ×1
GLOVE BIOGEL PI INDICATOR 9 (GLOVE) ×1
GOWN PREVENTION PLUS XLARGE (GOWN DISPOSABLE) ×4 IMPLANT
GOWN STRL NON-REIN LRG LVL3 (GOWN DISPOSABLE) ×24 IMPLANT
KIT BASIN OR (CUSTOM PROCEDURE TRAY) ×4 IMPLANT
KIT ROOM TURNOVER OR (KITS) ×4 IMPLANT
NEEDLE PERC 18GX7CM (NEEDLE) ×4 IMPLANT
NS IRRIG 1000ML POUR BTL (IV SOLUTION) ×4 IMPLANT
PACK GENERAL/GYN (CUSTOM PROCEDURE TRAY) ×4 IMPLANT
PACK UNIVERSAL I (CUSTOM PROCEDURE TRAY) ×4 IMPLANT
PAD ARMBOARD 7.5X6 YLW CONV (MISCELLANEOUS) ×8 IMPLANT
SHEATH AVANTI 11CM 5FR (MISCELLANEOUS) ×4 IMPLANT
SPONGE GAUZE 2X2 STER 10/PKG (GAUZE/BANDAGES/DRESSINGS) ×2
SPONGE GAUZE 4X4 12PLY (GAUZE/BANDAGES/DRESSINGS) ×4 IMPLANT
STAPLER VISISTAT 35W (STAPLE) IMPLANT
STOPCOCK MORSE 400PSI 3WAY (MISCELLANEOUS) ×4 IMPLANT
SUT ETHILON 3 0 PS 1 (SUTURE) ×8 IMPLANT
SUT SILK 2 0 FS (SUTURE) ×4 IMPLANT
SUT VIC AB 2-0 CTX 36 (SUTURE) ×4 IMPLANT
SUT VIC AB 3-0 SH 27 (SUTURE) ×3
SUT VIC AB 3-0 SH 27X BRD (SUTURE) ×3 IMPLANT
SUT VICRYL 4-0 PS2 18IN ABS (SUTURE) ×4 IMPLANT
SYR MEDRAD MARK V 150ML (SYRINGE) ×8 IMPLANT
TOWEL OR 17X24 6PK STRL BLUE (TOWEL DISPOSABLE) ×4 IMPLANT
TOWEL OR 17X26 10 PK STRL BLUE (TOWEL DISPOSABLE) ×4 IMPLANT
TUBING HIGH PRESSURE 120CM (CONNECTOR) ×4 IMPLANT
WATER STERILE IRR 1000ML POUR (IV SOLUTION) ×4 IMPLANT
WIRE BENTSON .035X145CM (WIRE) ×4 IMPLANT

## 2012-07-22 NOTE — Progress Notes (Signed)
Chaplain visited patient after receiving a page request from nurse. Chaplain found patient responsive but unstable. Pt. Brought in for drug overdose. Chaplain gathered family members in Consultation Room C and brought in physician to update them on the progress of their loved one. Chaplain shared words of comfort and encouragement with family members. Chaplain also provided ministry of presence and gave a bible to one family member upon her request. Pt was sent to OR for a procedure. Family members thanked Orthoptist for spiritual support and care. Chaplain will continue to provide spiritual care to patient and family members as needed.

## 2012-07-22 NOTE — Consult Note (Signed)
Reason for Consult: Compartment syndrome right upper arm Referring Physician: ER  Martin Chapman is an 21 y.o. male.  HPI: Patient is a 21 year old gentleman who by report was using IV drugs in his right upper extremity yesterday evening. Patient was found unconscious on the floor by family or friends and was brought to the emergency room. Patient was evaluated approximately 9 PM on Sunday with right upper extremity possible ischemic time up to 24 hours.  Past Medical History  Diagnosis Date  . Depression   . Anxiety   . Panic attacks     Past Surgical History  Procedure Date  . Extraction of wisdom teeth     Family History  Problem Relation Age of Onset  . Hypertension Father     Social History:  reports that he has been smoking Cigarettes.  He has been smoking about 1 pack per day. He does not have any smokeless tobacco history on file. He reports that he drinks alcohol. He reports that he uses illicit drugs (Cocaine).  Allergies: No Known Allergies  Medications: I have reviewed the patient's current medications.  Results for orders placed during the hospital encounter of 07/22/12 (from the past 48 hour(s))  URINE RAPID DRUG SCREEN (HOSP PERFORMED)     Status: Abnormal   Collection Time   07/22/12  4:32 PM      Component Value Range Comment   Opiates POSITIVE (*) NONE DETECTED    Cocaine NONE DETECTED  NONE DETECTED    Benzodiazepines NONE DETECTED  NONE DETECTED    Amphetamines NONE DETECTED  NONE DETECTED    Tetrahydrocannabinol NONE DETECTED  NONE DETECTED    Barbiturates NONE DETECTED  NONE DETECTED   URINALYSIS, ROUTINE W REFLEX MICROSCOPIC     Status: Normal   Collection Time   07/22/12  4:32 PM      Component Value Range Comment   Color, Urine YELLOW  YELLOW    APPearance CLEAR  CLEAR    Specific Gravity, Urine 1.007  1.005 - 1.030    pH 5.5  5.0 - 8.0    Glucose, UA NEGATIVE  NEGATIVE mg/dL    Hgb urine dipstick NEGATIVE  NEGATIVE    Bilirubin Urine  NEGATIVE  NEGATIVE    Ketones, ur NEGATIVE  NEGATIVE mg/dL    Protein, ur NEGATIVE  NEGATIVE mg/dL    Urobilinogen, UA 0.2  0.0 - 1.0 mg/dL    Nitrite NEGATIVE  NEGATIVE    Leukocytes, UA NEGATIVE  NEGATIVE MICROSCOPIC NOT DONE ON URINES WITH NEGATIVE PROTEIN, BLOOD, LEUKOCYTES, NITRITE, OR GLUCOSE <1000 mg/dL.  LACTIC ACID, PLASMA     Status: Abnormal   Collection Time   07/22/12  4:38 PM      Component Value Range Comment   Lactic Acid, Venous 10.7 (*) 0.5 - 2.2 mmol/L   CBC     Status: Abnormal   Collection Time   07/22/12  4:38 PM      Component Value Range Comment   WBC 37.8 (*) 4.0 - 10.5 K/uL    RBC 5.91 (*) 4.22 - 5.81 MIL/uL    Hemoglobin 18.3 (*) 13.0 - 17.0 g/dL    HCT 30.8  65.7 - 84.6 %    MCV 85.4  78.0 - 100.0 fL    MCH 31.0  26.0 - 34.0 pg    MCHC 36.2 (*) 30.0 - 36.0 g/dL    RDW 96.2  95.2 - 84.1 %    Platelets 279  150 - 400 K/uL  COMPREHENSIVE METABOLIC PANEL     Status: Abnormal   Collection Time   07/22/12  4:38 PM      Component Value Range Comment   Sodium 144  135 - 145 mEq/L    Potassium 5.5 (*) 3.5 - 5.1 mEq/L    Chloride 100  96 - 112 mEq/L    CO2 14 (*) 19 - 32 mEq/L    Glucose, Bld 50 (*) 70 - 99 mg/dL    BUN 19  6 - 23 mg/dL    Creatinine, Ser 2.95 (*) 0.50 - 1.35 mg/dL    Calcium 8.2 (*) 8.4 - 10.5 mg/dL    Total Protein 7.6  6.0 - 8.3 g/dL    Albumin 4.7  3.5 - 5.2 g/dL    AST 284 (*) 0 - 37 U/L    ALT 462 (*) 0 - 53 U/L    Alkaline Phosphatase 289 (*) 39 - 117 U/L    Total Bilirubin 0.9  0.3 - 1.2 mg/dL    GFR calc non Af Amer 35 (*) >90 mL/min    GFR calc Af Amer 41 (*) >90 mL/min   ETHANOL     Status: Abnormal   Collection Time   07/22/12  4:38 PM      Component Value Range Comment   Alcohol, Ethyl (B) 45 (*) 0 - 11 mg/dL   GLUCOSE, CAPILLARY     Status: Abnormal   Collection Time   07/22/12  6:24 PM      Component Value Range Comment   Glucose-Capillary 159 (*) 70 - 99 mg/dL   LACTIC ACID, PLASMA     Status: Abnormal    Collection Time   07/22/12  7:20 PM      Component Value Range Comment   Lactic Acid, Venous 8.2 (*) 0.5 - 2.2 mmol/L   CK TOTAL AND CKMB     Status: Abnormal   Collection Time   07/22/12  8:20 PM      Component Value Range Comment   Total CK >50000 (*) 7 - 232 U/L RESULTS CONFIRMED BY MANUAL DILUTION   CK, MB >500.0 (*) 0.3 - 4.0 ng/mL    Relative Index NOT CALCULATED  0.0 - 2.5   TROPONIN I     Status: Abnormal   Collection Time   07/22/12  8:20 PM      Component Value Range Comment   Troponin I 0.62 (*) <0.30 ng/mL   COMPREHENSIVE METABOLIC PANEL     Status: Abnormal   Collection Time   07/22/12  8:21 PM      Component Value Range Comment   Sodium 140  135 - 145 mEq/L    Potassium >7.5 (*) 3.5 - 5.1 mEq/L    Chloride 101  96 - 112 mEq/L    CO2 14 (*) 19 - 32 mEq/L    Glucose, Bld 45 (*) 70 - 99 mg/dL    BUN 22  6 - 23 mg/dL    Creatinine, Ser 1.32 (*) 0.50 - 1.35 mg/dL    Calcium 7.0 (*) 8.4 - 10.5 mg/dL    Total Protein 6.5  6.0 - 8.3 g/dL    Albumin 4.1  3.5 - 5.2 g/dL    AST 440 (*) 0 - 37 U/L    ALT 468 (*) 0 - 53 U/L    Alkaline Phosphatase 226 (*) 39 - 117 U/L    Total Bilirubin 0.8  0.3 - 1.2 mg/dL    GFR calc non Af Denyse Dago  33 (*) >90 mL/min    GFR calc Af Amer 38 (*) >90 mL/min   MAGNESIUM     Status: Abnormal   Collection Time   07/22/12  8:21 PM      Component Value Range Comment   Magnesium 4.2 (*) 1.5 - 2.5 mg/dL   PHOSPHORUS     Status: Abnormal   Collection Time   07/22/12  8:21 PM      Component Value Range Comment   Phosphorus 18.7 (*) 2.3 - 4.6 mg/dL   AMYLASE     Status: Abnormal   Collection Time   07/22/12  8:21 PM      Component Value Range Comment   Amylase 312 (*) 0 - 105 U/L   LIPASE, BLOOD     Status: Abnormal   Collection Time   07/22/12  8:21 PM      Component Value Range Comment   Lipase 745 (*) 11 - 59 U/L   CBC WITH DIFFERENTIAL     Status: Abnormal   Collection Time   07/22/12  8:21 PM      Component Value Range Comment   WBC  32.6 (*) 4.0 - 10.5 K/uL    RBC 5.21  4.22 - 5.81 MIL/uL    Hemoglobin 15.8  13.0 - 17.0 g/dL    HCT 16.1  09.6 - 04.5 %    MCV 85.2  78.0 - 100.0 fL    MCH 30.3  26.0 - 34.0 pg    MCHC 35.6  30.0 - 36.0 g/dL    RDW 40.9  81.1 - 91.4 %    Platelets 232  150 - 400 K/uL    Neutrophils Relative 87 (*) 43 - 77 %    Neutro Abs 28.5 (*) 1.7 - 7.7 K/uL    Lymphocytes Relative 5 (*) 12 - 46 %    Lymphs Abs 1.5  0.7 - 4.0 K/uL    Monocytes Relative 8  3 - 12 %    Monocytes Absolute 2.5 (*) 0.1 - 1.0 K/uL    Eosinophils Relative 0  0 - 5 %    Eosinophils Absolute 0.0  0.0 - 0.7 K/uL    Basophils Relative 0  0 - 1 %    Basophils Absolute 0.1  0.0 - 0.1 K/uL   PROTIME-INR     Status: Abnormal   Collection Time   07/22/12  8:21 PM      Component Value Range Comment   Prothrombin Time 22.0 (*) 11.6 - 15.2 seconds    INR 2.01 (*) 0.00 - 1.49   APTT     Status: Abnormal   Collection Time   07/22/12  8:21 PM      Component Value Range Comment   aPTT 46 (*) 24 - 37 seconds   PROCALCITONIN     Status: Normal   Collection Time   07/22/12  8:23 PM      Component Value Range Comment   Procalcitonin 6.62     POCT I-STAT 7, (LYTES, BLD GAS, ICA,H+H)     Status: Abnormal   Collection Time   07/22/12 10:40 PM      Component Value Range Comment   pH, Arterial 7.179 (*) 7.350 - 7.450    pCO2 arterial 47.5 (*) 35.0 - 45.0 mmHg    pO2, Arterial 72.0 (*) 80.0 - 100.0 mmHg    Bicarbonate 17.7 (*) 20.0 - 24.0 mEq/L    TCO2 19  0 - 100 mmol/L    O2  Saturation 90.0      Acid-base deficit 11.0 (*) 0.0 - 2.0 mmol/L    Sodium 142  135 - 145 mEq/L    Potassium 8.2 (*) 3.5 - 5.1 mEq/L    Calcium, Ion 0.93 (*) 1.12 - 1.23 mmol/L    HCT 38.0 (*) 39.0 - 52.0 %    Hemoglobin 12.9 (*) 13.0 - 17.0 g/dL    Sample type ARTERIAL      Comment NOTIFIED PHYSICIAN       Dg Chest Portable 1 View  07/22/2012  *RADIOLOGY REPORT*  Clinical Data: Drug overdose.  PORTABLE CHEST - 1 VIEW  Comparison: 04/08/2012   Findings: Heart size and pulmonary vascularity are normal and the lungs are clear.  No osseous abnormality.  IMPRESSION: Normal chest.   Original Report Authenticated By: Gwynn Burly, M.D.    Dg Shoulder Right Port  07/22/2012  *RADIOLOGY REPORT*  Clinical Data: Right shoulder pain.  PORTABLE RIGHT SHOULDER - 2+ VIEW  Comparison: None.  Findings: No fracture, dislocation, or other abnormality.  IMPRESSION: Normal exam.   Original Report Authenticated By: Gwynn Burly, M.D.     Review of Systems  All other systems reviewed and are negative.   Blood pressure 88/51, pulse 99, temperature 96.6 F (35.9 C), resp. rate 16, height 5\' 7"  (1.702 m), weight 73.483 kg (162 lb), SpO2 95.00%. Physical Exam On examination patient have palpable dorsalis pedis and posterior tibial pulses bilaterally. Patient was on 15 mcg of Levofed he was able to be conversant and his systolic blood blood pressure was approximately 45. Patient did not have a palpable radial or ulnar pulse in the right upper extremity. He did have palpable pulses the left. Patient did have a faint dopplerable pulse in the right upper extremity for the radial artery ulnar artery was not dopplerable. Patient had a soft forearm with no swelling he had tense swelling in the proximal arm on the right. Compartment pressures were measured in his compartment pressures were greater than 40 in the anterior and posterior compartment. Radiographs of the shoulder shows no evidence of a dislocation. Patient has significant amount swelling in the subclavian region. Assessment/Plan: Assessment: Compartment syndrome right upper extremity with possible subclavian vascular injury with swelling in the subclavian area as well.  Plan: Will emergently take patient up to the operating room this time. I consulted vascular surgery for evaluation the vascular status to the right upper extremity. Discussed with the patient's father in attendance surgical  intervention for fasciotomies. Discussed the patient's lack of circulation to the right upper extremity may be for a prolonged period of time he may already have a dead ischemic muscle. Will plan for vascular intervention for arteriogram to see if there's any arterial injury.  Osbaldo Mark V 07/22/2012, 11:24 PM

## 2012-07-22 NOTE — Op Note (Signed)
OPERATIVE REPORT  DATE OF SURGERY: 07/22/2012  PATIENT:  Martin Chapman,  21 y.o. male  PRE-OPERATIVE DIAGNOSIS:  Right Arm Trauma with compartment syndrome right proximal arm possible arterial injury  POST-OPERATIVE DIAGNOSIS:  Same  PROCEDURE:  Procedure(s): INTRA OPERATIVE ARTERIOGRAM FASCIOTOMY of the right arm INSERTION OF DIALYSIS CATHETER  SURGEON:  Surgeon(s): Sherren Kerns, MD Nadara Mustard, MD  ANESTHESIA:   general  EBL:  Minimal ML  SPECIMEN:  No Specimen  TOURNIQUET:  * No tourniquets in log *  PROCEDURE DETAILS: Patient is a 21 year old gentleman with compartment syndrome of the right upper extremity with ischemic changes. Patient did not have active motor function of his hand did not have sensation from the elbow distally. Patient may have had ischemic changes for over 24 hours. Risks and benefits of surgery were discussed with the patient's father including infection neurovascular injury necrotic ischemic muscle potential for loss of arm. Patient's father states he understands and wishes to proceed at this time. Description of procedure patient was brought to the hybrid operating room. After adequate levels of anesthesia were obtained surgical intervention I proceeded with both vascular and orthopedic surgery. My surgical procedure focused on the right upper extremity. The right upper extremity was prepped using DuraPrep and draped into a sterile field. An anterior incision was made from the clavicle along the anterior lateral border of the biceps to the antecubital fossa. The fascia of the biceps muscle was incised as well as the fascia of the brachialis muscle. Patient had ischemic changes to the proximal third of the biceps muscle. The remainder the muscle was healthy and viable. A separate fasciotomy was performed on the posterior compartment and the triceps were released as well. The wound was covered with Adaptic orthopedic sponges AB dressing Kerlix and  Coban. Patient was then focused for the vascular interventional procedure performed by Dr. fields.  PLAN OF CARE: Admit to inpatient   PATIENT DISPOSITION:  ICU - intubated and critically ill.   Nadara Mustard, MD 07/22/2012 11:30 PM

## 2012-07-22 NOTE — ED Notes (Signed)
Family at bedside, reports pt with hx of heroin use. RR 10 with pupils 4 bilaterally before narcan given, blood pressure remains <80. EDP and RES aware of pts blood pressure and respirations. IV established and fluids infusing. Pt on monitor. At this time able to tell me his birthday and who is at his bedside. Still remains very lethargic.

## 2012-07-22 NOTE — Preoperative (Signed)
Beta Blockers   Reason not to administer Beta Blockers:Not Applicable 

## 2012-07-22 NOTE — Progress Notes (Signed)
Orthopedic Tech Progress Note Patient Details:  Martin Chapman Nov 07, 1990 [id number] kuzman sling ordered by Dr. Lajoyce Corners. Ortho Devices Type of Ortho Device: Other (comment) (kuzman sling) Ortho Device/Splint Location: (R) UE Ortho Device/Splint Interventions: Application   Jennye Moccasin 07/22/2012, 9:43 PM

## 2012-07-22 NOTE — ED Notes (Addendum)
Narcan given. Pt alert asking questions. Reports abdominal pain. Pt reports have been drinking ETOH, IV heroine and xanax use

## 2012-07-22 NOTE — ED Notes (Signed)
Systolic BP heard with doppler at 66. Done by EDP and RN

## 2012-07-22 NOTE — ED Notes (Signed)
Pt skin becomes mottled to BUE, was noticed upon pt arrival to ED. Pt has c/o numbness to RUE and tingling

## 2012-07-22 NOTE — Progress Notes (Addendum)
ANTIBIOTIC CONSULT NOTE - INITIAL  Pharmacy Consult for vanc/zosyn Indication: pneumonia  No Known Allergies  Patient Measurements:    Vital Signs: Temp src: Oral (10/20 1600) BP: 76/24 mmHg (10/20 1715) Pulse Rate: 87  (10/20 1715) Intake/Output from previous day:   Intake/Output from this shift:    Labs:  Basename 07/22/12 1638  WBC 37.8*  HGB 18.3*  PLT 279  LABCREA --  CREATININE 2.45*   CrCl is unknown because there is no height on file for the current visit. No results found for this basename: VANCOTROUGH:2,VANCOPEAK:2,VANCORANDOM:2,GENTTROUGH:2,GENTPEAK:2,GENTRANDOM:2,TOBRATROUGH:2,TOBRAPEAK:2,TOBRARND:2,AMIKACINPEAK:2,AMIKACINTROU:2,AMIKACIN:2, in the last 72 hours   Microbiology: No results found for this or any previous visit (from the past 720 hour(s)).  Medical History: Past Medical History  Diagnosis Date  . Depression   . Anxiety   . Panic attacks     Medications:  Scheduled:    . dextrose  1 ampule Intravenous Once  . naloxone (NARCAN) injection  0.4 mg Intravenous Once   Assessment: 21 yo who was admitted for drug overdose. He might have aspirated. His wbc is severely elevated. Vanc has been ordered empirically to cover for infection. He also has ARF.   Goal of Therapy:  Vancomycin trough level 15-20 mcg/ml  Plan:  Vanc 1g IV q24 F/u with level if needed Zosyn 3.375g IV q8  Ulyses Southward Lenox 07/22/2012,6:23 PM

## 2012-07-22 NOTE — Consult Note (Signed)
[facility] HISTORY [facility] PHYSICAL    Requesting: Aldean Baker, MD  Reason: Right arm compartment syndrome with hematoma right shoulder  History of Present Illness:  Patient is a 21 y.o. year old male who presents for evaluation of right arm compartment syndrome with a right peri clavicular hematoma.  Unknown details in history.  Pt admits to using IV heroin yesterday.  Thinks he may have fallen down but does not remember circumstances.  Brought to ER after being found unconscious.  He is conscious now but hypotensive with SBP 40-80 on Levophed.  Denies using subclavian as injection site.  Has no function in the right arm.  Denies chest pain.  Past Medical History  Diagnosis Date  . Depression   . Anxiety   . Panic attacks     Past Surgical History  Procedure Date  . Extraction of wisdom teeth      Social History History  Substance Use Topics  . Smoking status: Current Every Day Smoker -- 1.0 packs/day    Types: Cigarettes  . Smokeless tobacco: Not on file  . Alcohol Use: Yes     rare    Family History Family History  Problem Relation Age of Onset  . Hypertension Father     Allergies  No Known Allergies   Current Facility-Administered Medications  Medication Dose Route Frequency Provider Last Rate Last Dose  . 0.9 %  sodium chloride infusion  250 mL Intravenous PRN Overton Mam, MD      . Ampicillin-Sulbactam (UNASYN) 3 g in sodium chloride 0.9 % 100 mL IVPB  3 g Intravenous Once Ozella Rocks, MD      . dextrose 5 % solution           . dextrose 50 % solution 50 mL  1 ampule Intravenous Once Ozella Rocks, MD   50 mL at 07/22/12 1802  . heparin injection 5,000 Units  5,000 Units Subcutaneous Q8H Overton Mam, MD      . naloxone [facility]) injection 0.4 mg  0.4 mg Intravenous Once Ozella Rocks, MD   0.4 mg at 07/22/12 1700  . naloxone [facility]) injection 0.4 mg  0.4 mg Intravenous PRN Ozella Rocks, MD   0.4 mg at 07/22/12 2106    . norepinephrine (LEVOPHED) 1 MG/ML injection        4 mg at 07/22/12 1935  . norepinephrine (LEVOPHED) 4 mg in dextrose 5 % 250 mL infusion  2-50 mcg/min Intravenous Titrated Daleen Bo, MD 37.5 mL/hr at 07/22/12 2009 10 mcg/min at 07/22/12 2009  . ondansetron (ZOFRAN) 4 MG/2ML injection           . ondansetron (ZOFRAN) 4 MG/2ML injection           . pantoprazole (PROTONIX) injection 40 mg  40 mg Intravenous QHS Overton Mam, MD      . sodium chloride 0.9 % bolus 1,000 mL  1,000 mL Intravenous Once Ozella Rocks, MD   1,000 mL at 07/22/12 1605  . sodium chloride 0.9 % bolus 1,000 mL  1,000 mL Intravenous Once Ozella Rocks, MD   1,000 mL at 07/22/12 1715  . sodium chloride 0.9 % bolus 1,000 mL  1,000 mL Intravenous Once Overton Mam, MD      . vancomycin (VANCOCIN) IVPB 1000 mg/200 mL premix  1,000 mg Intravenous Q24H Nelia Shi, MD 200 mL/hr at 07/22/12 1946 1,000 mg at 07/22/12 1946   No current outpatient prescriptions on  file.    ROS:    Cardiac: No recent episodes of chest pain/pressure, no shortness of breath at rest.  No shortness of breath with exertion.  Denies history of atrial fibrillation or irregular heartbeat  Psychological: + history of anxiety,  + history of depression   Physical Examination  Filed Vitals:   07/22/12 2005 07/22/12 2011 07/22/12 2036 07/22/12 2047  BP: 59/41 56/38 47/12  88/51  Pulse: 116 101 99   Temp: 95.9 F (35.5 C) 96.1 F (35.6 C) 96.4 F (35.8 C) 96.6 F (35.9 C)  TempSrc:      Resp: 17 17 15 16   Height:      Weight:      SpO2: 96% 96% 95%     Body mass index is 25.37 kg/(m^2).  General:  Answers questions, alert and oriented HEENT: Normal Neck: No bruit or JVD Pulmonary: Clear to auscultation bilaterally Cardiac: Regular Rate and Rhythm, 10 x 12 swollen infraclavicular area on right non pulsatile Abdomen: Soft, non-tender, non-distended, no mass Skin: No rash, no significant chest wall ecchymosis, hands and feet  pink and warm with cap refill Extremity Pulses:  doppler radial, brachial, dorsalis pedis, posterior tibial pulses bilaterally Musculoskeletal: No deformity, upper arm and forearm right side tense circumferentially Neurologic: Upper and lower extremity motor 5/5 lower extremities and left upper extremity, right upper extremity flaccid no motor or sensory  DATA:   CBC    Component Value Date/Time   WBC 37.8* 07/22/2012 1638   RBC 5.91* 07/22/2012 1638   HGB 18.3* 07/22/2012 1638   HCT 50.5 07/22/2012 1638   PLT 279 07/22/2012 1638   MCV 85.4 07/22/2012 1638   MCH 31.0 07/22/2012 1638   MCHC 36.2* 07/22/2012 1638   RDW 13.3 07/22/2012 1638   LYMPHSABS 2.3 04/08/2012 2050   MONOABS 1.5* 04/08/2012 2050   EOSABS 0.0 04/08/2012 2050   BASOSABS 0.0 04/08/2012 2050    BMET    Component Value Date/Time   NA 144 07/22/2012 1638   K 5.5* 07/22/2012 1638   CL 100 07/22/2012 1638   CO2 14* 07/22/2012 1638   GLUCOSE 50* 07/22/2012 1638   BUN 19 07/22/2012 1638   CREATININE 2.45* 07/22/2012 1638   CALCIUM 8.2* 07/22/2012 1638   GFRNONAA 35* 07/22/2012 1638   GFRAA 41* 07/22/2012 1638    ASSESSMENT: Compartment syndrome with rhabdo and myoglobinuria probably ischemia reperfusion, possible axillary subclavian trauma or dissection.   PLAN:  Dr Lajoyce Corners Ortho to open arm components viability of arm is questionable Alkalanize urine, hydrate Will need aortogram right arm angiogram to define right arm circulation with risk of contrast nephropathy permanent renal failure  Fabienne Bruns, MD [facility] [place] Office: [phone] Pager: [phone]

## 2012-07-22 NOTE — ED Notes (Signed)
Report given to receiving RN.

## 2012-07-22 NOTE — ED Notes (Signed)
Report called to RN on 2100 and to CRNA in OR

## 2012-07-22 NOTE — Op Note (Signed)
Procedure: Arch aortogram with right upper extremity arteriogram, insertion left femoral dialysis catheter, Ultrasound left groin Preoperative diagnosis: Hematoma right chest, renal failure Postoperative diagnosis: Same  Anesthesia: General  Operative details: After obtaining informed consent, the patient was taken to the operating room. The patient was placed in supine position the OR table. Both groins were prepped and draped in usual sterile fashion. Dr Lajoyce Corners proceeded to perform a right upper arm fasciotomy.  This is dictated as a separate op note.  Ultrasound was used to identify the right common femoral artery. An introducer needle was placed into the right common femoral artery and there was good backbleeding from this.  Next and 0.035 Bentsen wire was threaded up into the abdominal aorta and into the aortic arch under fluoroscopic guidance. A 5 French sheath was placed over the guidewire into the right common femoral artery. This was thoroughly flushed with heparinized saline. A 5 French pigtail catheter was then placed through the guidewire advanced up in the aortic arch and arch aortogram obtained in a 40 LAO view. This shows normal arch configuration with patent, right subclavian, right innominate, right common carotid, left common carotid, left subclavian, bilateral vertebral arteries.    The 5 French pigtail catheter was then pulled back over the guidewire and exchanged for a Berenstein 2 catheter. This was then used to selectively catheterize the innominate artery  Followed by the right subclavian artery. Injection was then performed through the subclavian artery. Projections were done in AP projection. The right subclavian, right axillary, right brachial, right ulnar and right radial arteries are all patent.  The palmar arch is incomplete.  There is opacification of the digital vessels but they are poorly opacified due to dilution.  Next, the Berenstein catheter was pulled back over the  guidewire and out. The 5 French sheath was thoroughly flushed with heparinized saline. It was sutured to the skin.    Next I placed a Trialysis 20 cm catheter in the right femoral system using an over the wire technique using a pre-existing right femoral venous line that had been placed in the ER.  The cathter tract was dilated and the catheter placed over the wire into the right femoral venous system.  All three ports were noted to flush and draw easily.  The patient tolerated the procedure well and there were no complications. The patient was transported to the ICU in stable condition.   Operative findings: #1 Normal arch anatomy, normal right upper extremity arterial tree with the exception of incomplete palmar arch.                                #2 73 cc total contrast                                #3 Left femoral artery 5 Fr sheath left in place for temporary a line and will need to be removed tomorrow                                #4 Right femoral vein 20 cm Trialysis catheter for temporary dialysis.   Fabienne Bruns, MD  [facility] [place]  Office: [phone]  Pager: [phone]

## 2012-07-22 NOTE — ED Provider Notes (Signed)
I saw and evaluated the patient, reviewed the resident's note and I agree with the findings and plan.   .Face to face Exam:  General:  Lethargic HEENT:  Atraumatic Resp:  Normal effort Abd:  Nondistended Neuro:No focal weakness Lymph: No adenopathy   CRITICAL CARE Performed by: Nelva Nay L   Total critical care time: 75 min   Critical care time was exclusive of separately billable procedures and treating other patients.  Critical care was necessary to treat or prevent imminent or life-threatening deterioration.  Critical care was time spent personally by me on the following activities: development of treatment plan with patient and/or surrogate as well as nursing, discussions with consultants, evaluation of patient's response to treatment, examination of patient, obtaining history from patient or surrogate, ordering and performing treatments and interventions, ordering and review of laboratory studies, ordering and review of radiographic studies, pulse oximetry and re-evaluation of patient's condition.   Nelia Shi, MD 07/22/12 [phone]

## 2012-07-22 NOTE — ED Notes (Signed)
PCCM placing L fem aline now.

## 2012-07-22 NOTE — ED Provider Notes (Signed)
History     CSN: [id number]  Arrival date & time 07/22/12  1551   First MD Initiated Contact with Patient 07/22/12 1553      Chief Complaint  Patient presents with  . Drug Overdose    (Consider location/radiation/quality/duration/timing/severity/associated sxs/prior treatment) HPI CC: Overdose  21yo male brought in by parents after receiving call from friend that pt was not waking up this afternoon. When parents arrived pt very difficult to arouse, vomit on face, and extremities w/ bluish hue. Pt got out of prison 3 days ago for drug related activities. Went to [employer] with friends. Pt difficult to arouse in ED. States he wants to sleep and drank 8 beers las night. Denies any drug use.  States that other at the party were also using Xanax.    Past Medical History  Diagnosis Date  . Depression   . Anxiety   . Panic attacks     Past Surgical History  Procedure Date  . Extraction of wisdom teeth     Family History  Problem Relation Age of Onset  . Hypertension Father     History  Substance Use Topics  . Smoking status: Current Every Day Smoker -- 1.0 packs/day    Types: Cigarettes  . Smokeless tobacco: Not on file  . Alcohol Use: Yes     rare      Review of Systems  Unable to perform ROS   Allergies  Review of patient's allergies indicates no known allergies.  Home Medications  No current outpatient prescriptions on file.  BP 76/24  Pulse 87  Resp 17  SpO2 97%  Physical Exam  Constitutional: He appears well-developed and well-nourished. He appears distressed.  HENT:  Head: Atraumatic.       Dried vomit on nose and mouth  Eyes:       Pupils constricted and sluggish  Cardiovascular: Normal rate, regular rhythm and normal heart sounds.   No murmur heard. Pulmonary/Chest: Breath sounds normal. No respiratory distress. He has no wheezes. He has no rales.       Low respiratory rate  Abdominal: Soft.       hypoactive bowel sounds    Musculoskeletal: Normal range of motion.  Neurological:       Difficult to arouse Pupils constricted and sluggish Oriented to date Speech slow and w/o purpose at times  Skin: He is not diaphoretic.       Cool fingers and toes.  Track marks in antecubital fossa bilaterally    ED Course  Procedures (including critical care time)  Labs Reviewed  LACTIC ACID, PLASMA - Abnormal; Notable for the following:    Lactic Acid, Venous 10.7 (*)     All other components within normal limits  CBC - Abnormal; Notable for the following:    WBC 37.8 (*)     RBC 5.91 (*)     Hemoglobin 18.3 (*)     MCHC 36.2 (*)     All other components within normal limits  COMPREHENSIVE METABOLIC PANEL - Abnormal; Notable for the following:    Potassium 5.5 (*)     CO2 14 (*)     Glucose, Bld 50 (*)     Creatinine, Ser 2.45 (*)     Calcium 8.2 (*)     AST 588 (*)     ALT 462 (*)     Alkaline Phosphatase 289 (*)     GFR calc non Af Amer 35 (*)     GFR calc  Af Amer 41 (*)     All other components within normal limits  ETHANOL - Abnormal; Notable for the following:    Alcohol, Ethyl (B) 45 (*)     All other components within normal limits  URINE RAPID DRUG SCREEN (HOSP PERFORMED) - Abnormal; Notable for the following:    Opiates POSITIVE (*)     All other components within normal limits  GLUCOSE, CAPILLARY - Abnormal; Notable for the following:    Glucose-Capillary 159 (*)     All other components within normal limits  CULTURE, BLOOD (ROUTINE X 2)  CULTURE, BLOOD (ROUTINE X 2)  URINE CULTURE  URINALYSIS, ROUTINE W REFLEX MICROSCOPIC   No results found.   No diagnosis found.   CRITICAL CARE Performed by: Rayley Gao   Total critical care time: >1hrs  Critical care time was exclusive of separately billable procedures and treating other patients.  Critical care was necessary to treat or prevent imminent or life-threatening deterioration.  Critical care was time spent personally by me  on the following activities: development of treatment plan with patient and/or surrogate as well as nursing, discussions with consultants, evaluation of patient's response to treatment, examination of patient, obtaining history from patient or surrogate, ordering and performing treatments and interventions, ordering and review of laboratory studies, ordering and review of radiographic studies, pulse oximetry and re-evaluation of patient's condition.   MDM  21yo m w/ likely heroin overdose. Low  BP and respiratory depression. Started on 1N NS bolus Pt administered 0.4mg  of Narcan and became significantly more awake w/ pupillary dilitation. GCS 10 prior to Narcan and 14 after administration - EKG normal - CBC, CMET, Lactic acid, UDS pending 4:09pm  4:50pm Update: Concern for R AC PIV infiltration due to cool tight bicep. Moved to L forearm 18 gauge and restarted fluids. SBP continues to be in low 70s.  - Repeat Narcan - continue IVF   5:18pm Update: Pt more awake after second dose of Narcan. SBP continues to be in mid 70s. White count and elevated Hgb noted. Likely w/ some demargination and hemoconcentration but concern for secondary infection. Rectal temp obtained and 87. Radal pulses faint but present - Apply Bear hugger - Warm IVF - blood cultures x2 - UA - UCX  6:07pm Update: Pt has now been moved to Trauma bay for further management. 14 gauge Femoral veinous line inserted. Pt w/ numerous lab abnormalities showing acute alcohol and opioid intoxication w/ likely acidotic state secondary to hypoxemic state secondary to decreased respiratory drive and tissue ischemia secondary to hypotension. Anticipate worsening of hepatic and renal function. Low glucose likely secondary to ETOH intoxication. R amr continues to be swollen but Radial pulses present on palpation and doppler. Foley inserted. - 1 amp Glucose - Continue warm IVF - CCM consulted   6:45pm Update: SBP now by Radial doppler.  Will reorder lactic acid and glucose after 4th L.   7:06PM Update:  Complaining R sholder pain. Unable to move R shoulder w/ sulcus sign and pain on attempt to do outward rotation and abduction of arm.  - shoulder film (unconfirmed but appears to have Acromion clavicular separation). - Lactate Acid - awaiting CXR   7:34pm Update: PCCM at bedside for evaluation and admission. Pressures continue to be soft. Will likely start on pressors and narcan drip   Shelly Flatten, MD Family Medicine PGY-2 07/22/2012, 7:35 PM      Ozella Rocks, MD 07/22/12 [phone]

## 2012-07-22 NOTE — ED Notes (Signed)
Pt was dropped off by friend at 1545, who was with him last night, reports that pt was drinking and possibly using xanax and heroin. Pt very unsteady on his feet with dried vomit on nose and on pants. Pt with poor circulation noted to hands. Possible track marks noted to arms. Pt cold to touch. Pt was moved to nurse first desk to be watched closely as we waited for parents to arrive. Placed paper scrub top on pt. Pt refused to let me check him in until his dad came. Dad arrived at 54 and convinced pt to let us check him in, pt remained very confused to situation and lethargic. Respirations noted to be around 10-12 and pt kept falling asleep easily. Pt moved to room at 1600.

## 2012-07-22 NOTE — Anesthesia Preprocedure Evaluation (Addendum)
Anesthesia Evaluation  Patient identified by MRN, date of birth, ID band Patient awake    Reviewed: Allergy & Precautions, H&P , NPO status , Patient's Chart, lab work & pertinent test results  History of Anesthesia Complications Negative for: history of anesthetic complications  Airway Mallampati: II TM Distance: >3 FB     Dental  (+) Teeth Intact and Dental Advisory Given   Pulmonary Current Smoker,    Pulmonary exam normal       Cardiovascular negative cardio ROS      Neuro/Psych PSYCHIATRIC DISORDERS Anxiety Depression negative neurological ROS     GI/Hepatic negative GI ROS, (+)     substance abuse  alcohol use, cocaine use, marijuana use, methamphetamine use and IV drug use,   Endo/Other  negative endocrine ROS  Renal/GU negative Renal ROS     Musculoskeletal   Abdominal   Peds  Hematology negative hematology ROS (+)   Anesthesia Other Findings   Reproductive/Obstetrics                           Anesthesia Physical Anesthesia Plan  ASA: IV and Emergent  Anesthesia Plan: General   Post-op Pain Management:    Induction: Intravenous  Airway Management Planned: Oral ETT  Additional Equipment:   Intra-op Plan:   Post-operative Plan: Post-operative intubation/ventilation  Informed Consent: I have reviewed the patients History and Physical, chart, labs and discussed the procedure including the risks, benefits and alternatives for the proposed anesthesia with the patient or authorized representative who has indicated his/her understanding and acceptance.   Dental advisory given  Plan Discussed with: CRNA, Anesthesiologist and Surgeon  Anesthesia Plan Comments:        Anesthesia Quick Evaluation

## 2012-07-22 NOTE — ED Notes (Signed)
Pt alert, NAD, calm, mildly anxious, narcan just given, interactive, skin diaphooretic, levophed continues at 106mcg/min, PCCM and Dr. Lajoyce Corners at [facility]. Triple lumen placed L IJ, tolerated well. BP suspect, placing A-line now prior to OR. R arm in sling, pt c/o NRB.

## 2012-07-22 NOTE — H&P (Signed)
Name: Martin Chapman MRN: [id number] DOB: 03/22/1991    LOS: 0  PULMONARY / CRITICAL CARE MEDICINE  HPI:  21 years old male with PMH relevant for heroin addiction, depression, anxiety and panic attacks. He overdosed on heroin and alcohol last night and today he was found unresponsive at about 2:00 pm. He was brought to the ED by friends. At admission he was unresponsive with good response to Narcan. He was hypotensive with MAP;s in the 40's despite aggressive IVF resuscitation. Required Narcan drip to stay awake. His Lactate at admission is 10 and was hypothermic. At the time of my exam he is awake, alert, oriented x 3 and cooperative. He does not have any particular complains except that he is thirsty. Denies CP, SOB, cough, abdominal pain, headaches. Or focal neurological symptoms. He had vomit in his mouth at arrival. Recently in prison for drug related issues.  Past Medical History  Diagnosis Date  . Depression   . Anxiety   . Panic attacks    Past Surgical History  Procedure Date  . Extraction of wisdom teeth    Prior to Admission medications   Not on File   Allergies No Known Allergies  Family History Family History  Problem Relation Age of Onset  . Hypertension Father    Social History  reports that he has been smoking Cigarettes.  He has been smoking about 1 pack per day. He does not have any smokeless tobacco history on file. He reports that he drinks alcohol. He reports that he uses illicit drugs (Cocaine).  Review Of Systems:  All systems reviewed and found negative except for what I mentioned in the HPI.     Vital Signs: Temp:  [95.2 F (35.1 C)-96.6 F (35.9 C)] 96.6 F (35.9 C) (10/20 2047) Pulse Rate:  [85-116] 99  (10/20 2036) Resp:  [10-23] 16  (10/20 2047) BP: (47-88)/(12-51) 88/51 mmHg (10/20 2047) SpO2:  [93 %-98 %] 95 % (10/20 2036) Weight:  [162 lb (73.483 kg)] 162 lb (73.483 kg) (10/20 1715)  Physical Examination: General: Awake, alert in  no acute distress Neuro:  Oriented x 3. Cannot move right hand and arm. Right arm is numb. HEENT:  PERRL, pink conjunctivae, dry mucous membranes Neck:  Supple, no JVD   Cardiovascular:  RRR, no M/R/G Lungs:  CTA Abdomen:  Soft, nontender, nondistended, bowel sounds present Musculoskeletal:  Right pectoral area, shoulder and arm are swollen. Right arm skin is tense. Radial pulse only detectable by doppler, cannot squeeze with right hand or move right arm. Skin is warm bud mottled. no pedal edema Skin:  No rash  Active problems: 1) Drug overdose (heroin, alcohol) 2) Rhabdomyolysis likely secondary to right arm injury 3) Right upper extremity compartment syndrome. Possible vascular injury 4) Acute renal failure secondary to volume depletion and rhabdomyolysis 5) Hyperkalemia 6) Elevated LFT's and INR, likely shock liver 7) Shock, likely hypovolemic, possibly septic. 8) Hypoxemia likely secondary to aspiration pneumonia / chemical pneumonitis  ASSESSMENT AND PLAN  PULMONARY No results found for this basename: PHART:5,PCO2:5,PCO2ART:5,PO2ART:5,HCO3:5,O2SAT:5 in the last 168 hours Ventilator Settings:   CXR: No acute infiltrates   A:   1) Hypoxemia likely secondary to aspiration pneumonia / pneumonitis P:   - Will come out intubated from the OR - Mechanical ventilation   - PRVC, Vt: 8cc/kg, PEEP: 5, RR: 20, FiO2 100% and adjust to keep O2 sat >94%  - Zosyn and vancomycin  CARDIOVASCULAR  Lab 07/22/12 2020 07/22/12 1920 07/22/12 1638  TROPONINI 0.62* -- --  LATICACIDVEN -- 8.2* 10.7*  PROBNP -- -- --   ECG:  NSR Lines: Left IJ placed 07/22/12  A:  1) Hypovolemic shock. Cannot rule out sepsis 2) Lactic acidosis likely secondary to shock and rhabdomyolysis 3) Right upper extremity injury with compartment syndrome and possible vascular injury P:  - Orthopedic surgery and vascular surgery input appreciated - Will go to the OR for arteriography of the RUE and fasciotomy. -  Continue aggressive IVF resuscitation. - Levophed drip -  Antibiotics as above  RENAL  Lab 07/22/12 2021 07/22/12 1638  NA 140 144  K >7.5* 5.5*  CL 101 100  CO2 14* 14*  BUN 22 19  CREATININE 2.61* 2.45*  CALCIUM 7.0* 8.2*  MG 4.2* --  PHOS 18.7* --   Intake/Output      10/20 0701 - 10/21 0700   I.V. (mL/kg) 5900 (80.3)   Total Intake(mL/kg) 5900 (80.3)   Urine (mL/kg/hr) 450 (0.4)   Total Output 450   Net +5450        Foley:  07/22/12  A:   1) Acute renal failure 2) Rhabdomyolysis 3) Hyperkalemia P:   - Will continue IVF to a goal urine output of 200 cc/hr - Will start bicarbonate drip at 150 cc/hr - Kayexalate enema 60gr - Calcium gluconate 1gr IV - Dextrose 50% + 10 units of insulin - Will recheck CMP, Ca, phos and Mg at 2:00 am - He will get IV contrast to evaluate RUE. - Will place dialysis catheter in OR by vascular surgery - Will call nephrology for emergent dialysis  GASTROINTESTINAL  Lab 07/22/12 2021 07/22/12 1638  AST 627* 588*  ALT 468* 462*  ALKPHOS 226* 289*  BILITOT 0.8 0.9  PROT 6.5 7.6  ALBUMIN 4.1 4.7    A:   1) Elevated LFT's and INR likely shock liver P:   - Will continue to monitor in am - Will use SCD's for DVT prophylaxis - GI prophylaxis with Protonix - Zofran PRN  HEMATOLOGIC  Lab 07/22/12 2021 07/22/12 1638  HGB 15.8 18.3*  HCT 44.4 50.5  PLT 232 279  INR 2.01* --  APTT 46* --   A:   1) Elevated INR likely from shock liver P:  - Will get DIC panel  INFECTIOUS  Lab 07/22/12 2023 07/22/12 2021 07/22/12 1638  WBC -- 32.6* 37.8*  PROCALCITON 6.62 -- --   Cultures: Blood and urine cultures ordered 07/22/12 Antibiotics: Zosyn and vancomycin  A:   1) Shock, cannot rule out sepsis from aspiration pneumonia, IV drug abuse. P:   - Antibiotics as above - Will continue aggressive IVF resuscitation - Consider echocardiogram in am if fever spikes.  ENDOCRINE  Lab 07/22/12 1824  GLUCAP 159*   A:   1) No  history of diabetes  P:   - POCT glucose q4hrs  NEUROLOGIC  A:   1) Awake, alert, oriented x 3 P:   - We will continue to monitor mental status. - Will come out intubated from the OR - Sedation with intermittent versed. - Will hold narcan drip  BEST PRACTICE / DISPOSITION - Level of Care:  ICU - Primary Service:  PCCM - Consultants:  Vascular surgery, orthopedic surgery, nephrology - Code Status:  Full code - Diet:  NPO - DVT Px:  SCD's - GI Px:  Protonix - Skin Integrity:  Intact - Social / Family:  Family updated at bedside.  The patient is critically ill with multiple organ systems failure and requires high complexity  decision making for assessment and support, frequent evaluation and titration of therapies, application of advanced monitoring technologies and extensive interpretation of multiple databases.   Critical Care Time devoted to patient care services described in this note is: 2 Hours  Overton Mam, M.D. Pulmonary and Critical Care Medicine [facility] Pager: [phone]  07/22/2012, 10:16 PM

## 2012-07-22 NOTE — ED Notes (Signed)
Deformity noticed to right shoulder with swelling. EDP notified

## 2012-07-22 NOTE — ED Notes (Signed)
Pt. Alert and oriented x3, responding appropriately to questions, asking appropriate questions, and requesting ice chips. Pt moving LUE, and BLE.  Pt unable to move RUE at this time.  Family at bedside.

## 2012-07-23 ENCOUNTER — Inpatient Hospital Stay (HOSPITAL_COMMUNITY): Payer: BC Managed Care – PPO

## 2012-07-23 DIAGNOSIS — T79A19A Traumatic compartment syndrome of unspecified upper extremity, initial encounter: Secondary | ICD-10-CM | POA: Diagnosis present

## 2012-07-23 DIAGNOSIS — J95821 Acute postprocedural respiratory failure: Secondary | ICD-10-CM | POA: Diagnosis not present

## 2012-07-23 DIAGNOSIS — K7200 Acute and subacute hepatic failure without coma: Secondary | ICD-10-CM | POA: Diagnosis present

## 2012-07-23 DIAGNOSIS — T50911A Poisoning by multiple unspecified drugs, medicaments and biological substances, accidental (unintentional), initial encounter: Secondary | ICD-10-CM | POA: Diagnosis present

## 2012-07-23 DIAGNOSIS — T401X4A Poisoning by heroin, undetermined, initial encounter: Principal | ICD-10-CM

## 2012-07-23 DIAGNOSIS — E875 Hyperkalemia: Secondary | ICD-10-CM | POA: Diagnosis present

## 2012-07-23 DIAGNOSIS — M6282 Rhabdomyolysis: Secondary | ICD-10-CM | POA: Diagnosis present

## 2012-07-23 DIAGNOSIS — J9600 Acute respiratory failure, unspecified whether with hypoxia or hypercapnia: Secondary | ICD-10-CM

## 2012-07-23 DIAGNOSIS — T17800A Unspecified foreign body in other parts of respiratory tract causing asphyxiation, initial encounter: Secondary | ICD-10-CM | POA: Diagnosis present

## 2012-07-23 DIAGNOSIS — N179 Acute kidney failure, unspecified: Secondary | ICD-10-CM | POA: Diagnosis present

## 2012-07-23 DIAGNOSIS — E872 Acidosis: Secondary | ICD-10-CM

## 2012-07-23 LAB — CREATININE, URINE, RANDOM: Creatinine, Urine: 59.2 mg/dL

## 2012-07-23 LAB — COMPREHENSIVE METABOLIC PANEL
ALT: 444 U/L — ABNORMAL HIGH (ref 0–53)
AST: 1056 U/L — ABNORMAL HIGH (ref 0–37)
CO2: 24 mEq/L (ref 19–32)
Calcium: 6.8 mg/dL — ABNORMAL LOW (ref 8.4–10.5)
GFR calc non Af Amer: 40 mL/min — ABNORMAL LOW (ref >90)
Sodium: 141 mEq/L (ref 135–145)

## 2012-07-23 LAB — URINALYSIS, ROUTINE W REFLEX MICROSCOPIC
Nitrite: NEGATIVE
Specific Gravity, Urine: 1.034 — ABNORMAL HIGH (ref 1.005–1.030)
pH: 5.5 (ref 5.0–8.0)

## 2012-07-23 LAB — RENAL FUNCTION PANEL
Albumin: 2.6 g/dL — ABNORMAL LOW (ref 3.5–5.2)
Albumin: 2.7 g/dL — ABNORMAL LOW (ref 3.5–5.2)
BUN: 27 mg/dL — ABNORMAL HIGH (ref 6–23)
Calcium: 7 mg/dL — ABNORMAL LOW (ref 8.4–10.5)
GFR calc Af Amer: 43 mL/min — ABNORMAL LOW (ref >90)
GFR calc Af Amer: 36 mL/min — ABNORMAL LOW (ref >90)
GFR calc non Af Amer: 31 mL/min — ABNORMAL LOW (ref >90)
Glucose, Bld: 98 mg/dL (ref 70–99)
Phosphorus: 5.4 mg/dL — ABNORMAL HIGH (ref 2.3–4.6)
Phosphorus: 4.6 mg/dL (ref 2.3–4.6)
Potassium: 3.6 mEq/L (ref 3.5–5.1)
Sodium: 143 mEq/L (ref 135–145)
Sodium: 142 mEq/L (ref 135–145)

## 2012-07-23 LAB — GLUCOSE, CAPILLARY
Glucose-Capillary: 75 mg/dL (ref 70–99)
Glucose-Capillary: 70 mg/dL (ref 70–99)
Glucose-Capillary: 126 mg/dL — ABNORMAL HIGH (ref 70–99)

## 2012-07-23 LAB — ABO/RH: ABO/RH(D): O POS

## 2012-07-23 LAB — CORTISOL: Cortisol, Plasma: 49 ug/dL

## 2012-07-23 LAB — POCT I-STAT 7, (LYTES, BLD GAS, ICA,H+H)
Bicarbonate: 18.9 mEq/L — ABNORMAL LOW (ref 20.0–24.0)
Hemoglobin: 9.9 g/dL — ABNORMAL LOW (ref 13.0–17.0)
O2 Saturation: 88 %
Patient temperature: 35.4
TCO2: 20 mmol/L (ref 0–100)
pCO2 arterial: 44.2 mmHg (ref 35.0–45.0)
pH, Arterial: 7.229 — ABNORMAL LOW (ref 7.350–7.450)

## 2012-07-23 LAB — DIC (DISSEMINATED INTRAVASCULAR COAGULATION)PANEL
Fibrinogen: 83 mg/dL — CL (ref 204–475)
INR: 2.39 — ABNORMAL HIGH (ref 0.00–1.49)
Platelets: 124 10*3/uL — ABNORMAL LOW (ref 150–400)
Smear Review: NONE SEEN
aPTT: 43 seconds — ABNORMAL HIGH (ref 24–37)

## 2012-07-23 LAB — SODIUM, URINE, RANDOM: Sodium, Ur: 62 mEq/L

## 2012-07-23 LAB — CBC
HCT: 38.4 % — ABNORMAL LOW (ref 39.0–52.0)
Hemoglobin: 13.6 g/dL (ref 13.0–17.0)
MCH: 28.8 pg (ref 26.0–34.0)
MCHC: 35.4 g/dL (ref 30.0–36.0)

## 2012-07-23 LAB — BLOOD GAS, ARTERIAL
Acid-base deficit: 9.7 mmol/L — ABNORMAL HIGH (ref 0.0–2.0)
Bicarbonate: 15.9 mEq/L — ABNORMAL LOW (ref 20.0–24.0)
Drawn by: 252031
FIO2: 1 %
RATE: 20 resp/min
TCO2: 17 mmol/L (ref 0–100)
pCO2 arterial: 36.1 mmHg (ref 35.0–45.0)
pO2, Arterial: 85.3 mmHg (ref 80.0–100.0)

## 2012-07-23 LAB — URINE MICROSCOPIC-ADD ON

## 2012-07-23 LAB — HEPATIC FUNCTION PANEL
ALT: 454 U/L — ABNORMAL HIGH (ref 0–53)
AST: 1133 U/L — ABNORMAL HIGH (ref 0–37)
Albumin: 2.7 g/dL — ABNORMAL LOW (ref 3.5–5.2)
Alkaline Phosphatase: 65 U/L (ref 39–117)
Total Bilirubin: 2 mg/dL — ABNORMAL HIGH (ref 0.3–1.2)

## 2012-07-23 LAB — CK: Total CK: >50000 U/L — ABNORMAL HIGH (ref 7–232)

## 2012-07-23 LAB — PROCALCITONIN: Procalcitonin: 31.4 ng/mL

## 2012-07-23 LAB — MRSA PCR SCREENING: MRSA by PCR: NEGATIVE

## 2012-07-23 LAB — TROPONIN I: Troponin I: 1.07 ng/mL (ref <0.30)

## 2012-07-23 MED ORDER — DEXTROSE 50 % IV SOLN
INTRAVENOUS | Status: AC
  Administered 2012-07-23: 50 mL
  Filled 2012-07-23: qty 50

## 2012-07-23 MED ORDER — ONDANSETRON HCL 4 MG/2ML IJ SOLN
4.0000 mg | Freq: Four times a day (QID) | INTRAMUSCULAR | Status: DC | PRN
  Administered 2012-08-08 – 2012-08-13 (×4): 4 mg via INTRAVENOUS
  Filled 2012-07-23 (×3): qty 2

## 2012-07-23 MED ORDER — FENTANYL BOLUS VIA INFUSION
25.0000 ug | Freq: Four times a day (QID) | INTRAVENOUS | Status: DC | PRN
  Administered 2012-07-26: 50 ug via INTRAVENOUS
  Administered 2012-07-27 (×2): 100 ug via INTRAVENOUS
  Filled 2012-07-23: qty 100

## 2012-07-23 MED ORDER — SODIUM CHLORIDE 0.9 % FOR CRRT
INTRAVENOUS_CENTRAL | Status: DC | PRN
  Filled 2012-07-23: qty 1000

## 2012-07-23 MED ORDER — PIPERACILLIN-TAZOBACTAM 3.375 G IVPB 30 MIN
3.3750 g | Freq: Four times a day (QID) | INTRAVENOUS | Status: DC
  Administered 2012-07-23 – 2012-07-24 (×4): 3.375 g via INTRAVENOUS
  Filled 2012-07-23: qty 50

## 2012-07-23 MED ORDER — STERILE WATER FOR INJECTION IV SOLN
INTRAVENOUS | Status: DC
  Administered 2012-07-23 – 2012-07-24 (×15): via INTRAVENOUS_CENTRAL
  Filled 2012-07-23 (×22): qty 150

## 2012-07-23 MED ORDER — PRISMASOL BGK 0/2.5 32-2.5 MEQ/L IV SOLN
INTRAVENOUS | Status: DC
  Administered 2012-07-23 – 2012-07-24 (×6): via INTRAVENOUS_CENTRAL
  Filled 2012-07-23 (×15): qty 5000

## 2012-07-23 MED ORDER — HEPARIN SODIUM (PORCINE) 1000 UNIT/ML DIALYSIS
1000.0000 [IU] | INTRAMUSCULAR | Status: DC | PRN
  Filled 2012-07-23: qty 3
  Filled 2012-07-23 (×2): qty 6

## 2012-07-23 MED ORDER — SODIUM CHLORIDE 0.9 % IV SOLN
1.0000 mg/h | INTRAVENOUS | Status: DC
  Administered 2012-07-23 (×3): 4 mg/h via INTRAVENOUS
  Administered 2012-07-24: 6 mg/h via INTRAVENOUS
  Administered 2012-07-24 – 2012-07-25 (×3): 4 mg/h via INTRAVENOUS
  Administered 2012-07-26 – 2012-07-27 (×2): 2 mg/h via INTRAVENOUS
  Administered 2012-07-28 (×3): 6 mg/h via INTRAVENOUS
  Administered 2012-07-28 – 2012-07-29 (×2): 4 mg/h via INTRAVENOUS
  Filled 2012-07-23 (×14): qty 10

## 2012-07-23 MED ORDER — PIPERACILLIN-TAZOBACTAM 3.375 G IVPB
3.3750 g | Freq: Four times a day (QID) | INTRAVENOUS | Status: DC
  Filled 2012-07-23 (×4): qty 50

## 2012-07-23 MED ORDER — HYDRALAZINE HCL 20 MG/ML IJ SOLN
10.0000 mg | INTRAMUSCULAR | Status: DC | PRN
  Administered 2012-07-23: 10 mg via INTRAVENOUS
  Filled 2012-07-23 (×2): qty 0.5

## 2012-07-23 MED ORDER — DEXTROSE-NACL 5-0.9 % IV SOLN
INTRAVENOUS | Status: DC
  Administered 2012-07-23 – 2012-07-29 (×4): via INTRAVENOUS

## 2012-07-23 MED ORDER — LABETALOL HCL 5 MG/ML IV SOLN: 10.0000 mg | INTRAVENOUS | Status: DC | PRN

## 2012-07-23 MED ORDER — PRISMASOL BGK 0/2.5 32-2.5 MEQ/L IV SOLN
INTRAVENOUS | Status: DC
  Administered 2012-07-23 – 2012-07-24 (×5): via INTRAVENOUS_CENTRAL
  Filled 2012-07-23 (×4): qty 5000

## 2012-07-23 MED ORDER — MIDAZOLAM BOLUS VIA INFUSION
1.0000 mg | INTRAVENOUS | Status: DC | PRN
  Administered 2012-07-26: 1 mg via INTRAVENOUS
  Administered 2012-07-27 (×2): 2 mg via INTRAVENOUS
  Filled 2012-07-23: qty 2

## 2012-07-23 MED ORDER — SODIUM CHLORIDE 0.9 % IV SOLN
25.0000 ug/h | INTRAVENOUS | Status: DC
  Administered 2012-07-23 – 2012-07-25 (×3): 100 ug/h via INTRAVENOUS
  Administered 2012-07-27 (×2): 150 ug/h via INTRAVENOUS
  Administered 2012-07-28 (×2): 300 ug/h via INTRAVENOUS
  Administered 2012-07-28: 250 ug/h via INTRAVENOUS
  Filled 2012-07-23 (×10): qty 50

## 2012-07-23 NOTE — Anesthesia Postprocedure Evaluation (Signed)
Anesthesia Post Note  Patient: Martin Chapman  Procedure(s) Performed: Procedure(s) (LRB): INTRA OPERATIVE ARTERIOGRAM (Left) FASCIOTOMY (Right) INSERTION OF DIALYSIS CATHETER (Right)  Anesthesia type: General  Patient location: ICU  Post pain: Pain level controlled  Post assessment: Post-op Vital signs reviewed  Last Vitals:  Filed Vitals:   07/22/12 2047  BP: 88/51  Pulse:   Temp: 35.9 C  Resp: 16    Post vital signs: stable  Level of consciousness: Patient remains intubated per anesthesia plan  Complications: No apparent anesthesia complications

## 2012-07-23 NOTE — Progress Notes (Signed)
Name: Martin Chapman MRN: [id number] DOB: 1991-04-23    LOS: 1  PULMONARY / CRITICAL CARE MEDICINE  HPI:  21 years old male with PMH relevant for heroin addiction, depression, anxiety and panic attacks. He overdosed on heroin and alcohol 10/19 PM  And 10/20 PM he was found unresponsive at about 2:00 pm. Pt now on vent, s/p compartment syndrome and release in OR of R shoulder muscles. Pt in ARF and vasopressor dependent on CVVH.  Current Status: Remains critically ill. Pt came back from OR on vent.  Vital Signs: Temp:  [95.2 F (35.1 C)-97.9 F (36.6 C)] 97.9 F (36.6 C) (10/21 1300) Pulse Rate:  [85-116] 102  (10/21 1300) Resp:  [10-23] 19  (10/21 1300) BP: (47-173)/(12-104) 100/59 mmHg (10/21 1200) SpO2:  [93 %-100 %] 99 % (10/21 1300) Arterial Line BP: (90-184)/(50-105) 95/50 mmHg (10/21 1300) FiO2 (%):  [40 %-100 %] 40 % (10/21 1133) Weight:  [73.483 kg (162 lb)] 73.483 kg (162 lb) (10/20 1715)  Physical Examination: General:sedated on vent, no acute distress  Neuro: sedated on vent. HEENT:  PERRL, pink conjunctivae, dry mucous membranes, ETT in place Neck:  Supple, no JVD   Cardiovascular:  RRR, no M/R/G Lungs:  CTA Abdomen:  Soft, nontender, nondistended, bowel sounds present Musculoskeletal: dry dressing on R shoulder Skin:  No rash  Active problems: 1) Drug overdose (heroin, alcohol) 2) Rhabdomyolysis likely secondary to right arm injury 3) Right upper extremity compartment syndrome. Possible vascular injury 4) Acute renal failure secondary to volume depletion and rhabdomyolysis 5) Hyperkalemia 6) Elevated LFT's and INR, likely shock liver 7) Shock, likely hypovolemic, possibly septic. 8) Hypoxemia likely secondary to aspiration pneumonia / chemical pneumonitis  ASSESSMENT AND PLAN  PULMONARY  Lab 07/23/12 0049 07/22/12 2240  PHART 7.266* 7.179*  PCO2ART 36.1 47.5*  PO2ART 85.3 72.0*  HCO3 15.9* 17.7*  O2SAT 96.8 90.0   Ventilator  Settings: Vent Mode:  [-] PRVC FiO2 (%):  [40 %-100 %] 40 % Set Rate:  [18 bmp-20 bmp] 20 bmp Vt Set:  [530 mL] 530 mL PEEP:  [5 cmH20] 5 cmH20 Plateau Pressure:  [10 cmH20-24 cmH20] 10 cmH20 CXR: No acute infiltrates, ETT in place   A:   1) Hypoxemia likely secondary to aspiration pneumonia / pneumonitis P:   -cont full vent support WUA/SBT  daily   CARDIOVASCULAR  Lab 07/23/12 0800 07/23/12 0050 07/22/12 2020 07/22/12 1920 07/22/12 1638  TROPONINI 1.78* 1.07* 0.62* -- --  LATICACIDVEN -- -- -- 8.2* 10.7*  PROBNP -- -- -- -- --   ECG:  NSR Lines: Left IJ placed 07/22/12  A:  1) Hypovolemic shock. Cannot rule out sepsis 2) Lactic acidosis likely secondary to shock and rhabdomyolysis 3) Right upper extremity injury with compartment syndrome and possible vascular injury #1 and 2 improved AM 10/21, off vasopressors Note arch aortogram in OR normal.  No vascular deficit in RUE/shoulder P:  - Orthopedic surgery and vascular surgery input appreciated -now off vasopressors   RENAL  Lab 07/23/12 0500 07/23/12 0156 07/22/12 2240 07/22/12 2021 07/22/12 1638  NA 143 141 142 140 144  K 5.2* 5.9* -- -- --  CL 108 109 -- 101 100  CO2 25 24 -- 14* 14*  BUN 26* 24* -- 22 19  CREATININE 2.34* 2.22* -- 2.61* 2.45*  CALCIUM 7.0* 6.8* -- 7.0* 8.2*  MG -- 2.3 -- 4.2* --  PHOS 5.4* 6.6* -- 18.7* --   Intake/Output      10/20 0701 - 10/21  0700 10/21 0701 - 10/22 0700   I.V. (mL/kg) 8086.7 (110) 229 (3.1)   NG/GT  60   IV Piggyback 37.5 12.5   Total Intake(mL/kg) 8124.2 (110.6) 301.5 (4.1)   Urine (mL/kg/hr) 480 (0.3) 43 (0.1)   Other 163 329   Total Output 643 372   Net +7481.2 -70.5         Foley:  07/22/12  A:   1) Acute renal failure ON CRRT 2) Rhabdomyolysis 3) Hyperkalemia>>>better P:   -Now on CRRT. HD cath placed in OR by Vascular Surgery Per renal    GASTROINTESTINAL  Lab 07/23/12 0500 07/23/12 0156 07/22/12 2021 07/22/12 1638  AST 1133* 1056* 627* 588*   ALT 454* 444* 468* 462*  ALKPHOS 65 63 226* 289*  BILITOT 2.0* 1.3* 0.8 0.9  PROT 4.5* 4.1* 6.5 7.6  ALBUMIN 2.7*2.6* 2.6* 4.1 4.7    A:   1) Elevated LFT's and INR likely shock liver P:   - Will continue to monitor in am - Will use SCD's for DVT prophylaxis - GI prophylaxis with Protonix  HEMATOLOGIC  Lab 07/23/12 0500 07/23/12 0050 07/22/12 2240 07/22/12 2021 07/22/12 1638  HGB 13.6 -- 12.9* 15.8 18.3*  HCT 38.4* -- 38.0* 44.4 50.5  PLT 179 124* -- 232 279  INR 1.64* 2.39* -- 2.01* --  APTT -- 43* -- 46* --   A:   1) Elevated INR likely from shock liver P:  - f/u DIC panel  INFECTIOUS  Lab 07/23/12 0500 07/22/12 2023 07/22/12 2021 07/22/12 1638  WBC 27.8* -- 32.6* 37.8*  PROCALCITON -- 6.62 -- --   Cultures: Blood and urine cultures ordered 07/22/12 Antibiotics: Zosyn and vancomycin 10/20  A:   1) Shock, cannot rule out sepsis from aspiration pneumonia, IV drug abuse. P:   - Antibiotics as above   ENDOCRINE  Lab 07/23/12 1218 07/23/12 0729 07/23/12 0359 07/23/12 0014 07/22/12 1824  GLUCAP 53* 70 75 82 159*   A:   1) No history of diabetes . Low CBGs poss d/t liver disease  P:   - POCT glucose q4hrs -change IVF to D5NS at 50/hr NEUROLOGIC  A:   1) sedated on vent postop P:   -sedation protocol   BEST PRACTICE / DISPOSITION - Level of Care:  ICU - Primary Service:  PCCM - Consultants:  Vascular surgery, orthopedic surgery, nephrology - Code Status:  Full code - Diet:  NPO - DVT Px:  SCD's - GI Px:  Protonix - Skin Integrity:  Intact - Social / Family:  No family at bedside this am  The patient is critically ill with multiple organ systems failure and requires high complexity decision making for assessment and support, frequent evaluation and titration of therapies, application of advanced monitoring technologies and extensive interpretation of multiple databases.   Critical Care Time devoted to patient care services described in this note  is: 1 Hour  Caryl Bis  [phone]  Cell  [phone]  If no response or cell goes to voicemail, call beeper [phone]  07/23/2012, 1:48 PM

## 2012-07-23 NOTE — Progress Notes (Deleted)
Name: Martin Chapman MRN: [id number] DOB: [birth year]    LOS: 1  PULMONARY / CRITICAL CARE MEDICINE  HPI:  21 years old male with PMH relevant for heroin addiction, depression, anxiety and panic attacks. He overdosed on heroin and alcohol last night and today he was found unresponsive at about 2:00 pm. He was brought to the ED by friends. At admission he was unresponsive with good response to Narcan. He was hypotensive with MAP;s in the 40's despite aggressive IVF resuscitation. Required Narcan drip to stay awake. His Lactate at admission is 10 and was hypothermic. At the time of my exam he is awake, alert, oriented x 3 and cooperative. He does not have any particular complains except that he is thirsty. Denies CP, SOB, cough, abdominal pain, headaches. Or focal neurological symptoms. He had vomit in his mouth at arrival. Recently in prison for drug related issues.  Patient Summary: 21 years old male with PMH relevant for heroin addiction, depression, anxiety and panic attacks who overdosed on heroin and alcohol and was found unresponsive around 2pm on 10/20, found to have right arm compartment syndrome s/p fasciotomy and angiography, rhabdomyolysis and acute renal failure requiring CRRT.   Vital Signs: Temp:  [95.2 F (35.1 C)-97.9 F (36.6 C)] 97.9 F (36.6 C) (10/21 1200) Pulse Rate:  [85-116] 103  (10/21 1200) Resp:  [10-23] 20  (10/21 1200) BP: (47-173)/(12-104) 100/59 mmHg (10/21 1200) SpO2:  [93 %-100 %] 100 % (10/21 1200) Arterial Line BP: (90-184)/(51-105) 102/53 mmHg (10/21 1200) FiO2 (%):  [40 %-100 %] 40 % (10/21 1133) Weight:  [162 lb (73.483 kg)] 162 lb (73.483 kg) (10/20 1715)  Physical Examination: General: intubated, sedated Neuro:  sedated HEENT:  PERRL, pink conjunctivae, dry mucous membranes Neck:  Supple, no JVD   Cardiovascular:  RRR, no M/R/G Lungs:  CTA Abdomen:  Soft, nontender, nondistended, bowel sounds present Musculoskeletal:  Right arm dressed s/p  fasciotomy Skin:  No rash  Active problems: 1) Drug overdose (heroin, alcohol) 2) Rhabdomyolysis likely secondary to right arm injury 3) Right upper extremity compartment syndrome. Possible vascular injury 4) Acute renal failure secondary to volume depletion and rhabdomyolysis 5) Hyperkalemia 6) Elevated LFT's and INR, likely shock liver 7) Shock, likely hypovolemic, possibly septic. 8) Hypoxemia likely secondary to aspiration pneumonia / chemical pneumonitis  ASSESSMENT AND PLAN  PULMONARY  Lab 07/23/12 0049 07/22/12 2240  PHART 7.266* 7.179*  PCO2ART 36.1 47.5*  PO2ART 85.3 72.0*  HCO3 15.9* 17.7*  O2SAT 96.8 90.0   Ventilator Settings: Vent Mode:  [-] PRVC FiO2 (%):  [40 %-100 %] 40 % Set Rate:  [18 bmp-20 bmp] 20 bmp Vt Set:  [530 mL] 530 mL PEEP:  [5 cmH20] 5 cmH20 Plateau Pressure:  [10 cmH20-24 cmH20] 10 cmH20 CXR: 10/21: Development of mild pulmonary edema and potentially also infiltrate  of the right lung.  A:   1) Hypoxemia likely secondary to aspiration pneumonia / pneumonitis P:   - intubated s/p OR - Mechanical ventilation   - PRVC, Vt: 8cc/kg, PEEP: 5, RR: 20, FiO2  40%  - Zosyn and vancomycin  CARDIOVASCULAR  Lab 07/23/12 0800 07/23/12 0050 07/22/12 2020 07/22/12 1920 07/22/12 1638  TROPONINI 1.78* 1.07* 0.62* -- --  LATICACIDVEN -- -- -- 8.2* 10.7*  PROBNP -- -- -- -- --   ECG:  NSR Lines: Left IJ placed 07/22/12  A:  1) Hypovolemic shock. Cannot rule out sepsis 2) Lactic acidosis likely secondary to shock and rhabdomyolysis 3) Right upper extremity injury with  compartment syndrome, no vascular injury noted s/p arteriogram 10/20 P:  - s/p right arm fasciotomy. Plan to repeat debridement 10/22 if medically stable, per Dr. Lajoyce Corners - Continue aggressive IVF resuscitation. - Levophed drip -  Antibiotics as above  RENAL  Lab 07/23/12 0500 07/23/12 0156 07/22/12 2240 07/22/12 2021 07/22/12 1638  NA 143 141 142 140 144  K 5.2* 5.9* -- -- --  CL  108 109 -- 101 100  CO2 25 24 -- 14* 14*  BUN 26* 24* -- 22 19  CREATININE 2.34* 2.22* -- 2.61* 2.45*  CALCIUM 7.0* 6.8* -- 7.0* 8.2*  MG -- 2.3 -- 4.2* --  PHOS 5.4* 6.6* -- 18.7* --   Intake/Output      10/20 0701 - 10/21 0700 10/21 0701 - 10/22 0700   I.V. (mL/kg) 8086.7 (110) 195 (2.7)   NG/GT  60   IV Piggyback 37.5 12.5   Total Intake(mL/kg) 8124.2 (110.6) 267.5 (3.6)   Urine (mL/kg/hr) 480 (0.3) 43 (0.1)   Other 163 329   Total Output 643 372   Net +7481.2 -104.5         Foley:  07/22/12  A:   1) Acute renal failure 2) Rhabdomyolysis 3) Hyperkalemia P:   - receiving CRRT. Appreciate renal consult.  - dialysis to correct acidosis - hyperkalemia: dialysis for correction - am CMP  GASTROINTESTINAL  Lab 07/23/12 0500 07/23/12 0156 07/22/12 2021 07/22/12 1638  AST 1133* 1056* 627* 588*  ALT 454* 444* 468* 462*  ALKPHOS 65 63 226* 289*  BILITOT 2.0* 1.3* 0.8 0.9  PROT 4.5* 4.1* 6.5 7.6  ALBUMIN 2.7*2.6* 2.6* 4.1 4.7    A:   1) Elevated LFT's and INR likely shock liver P:   - am CMP - Will use SCD's for DVT prophylaxis - GI prophylaxis with Protonix - Zofran PRN  HEMATOLOGIC  Lab 07/23/12 0500 07/23/12 0050 07/22/12 2240 07/22/12 2021 07/22/12 1638  HGB 13.6 -- 12.9* 15.8 18.3*  HCT 38.4* -- 38.0* 44.4 50.5  PLT 179 124* -- 232 279  INR 1.64* 2.39* -- 2.01* --  APTT -- 43* -- 46* --   A:   1) Elevated INR likely from shock liver Low fibrinogen with elevated D-dimer. Possible DIC P:  - monitor morning coags  INFECTIOUS  Lab 07/23/12 0500 07/22/12 2023 07/22/12 2021 07/22/12 1638  WBC 27.8* -- 32.6* 37.8*  PROCALCITON -- 6.62 -- --   Cultures: Blood and urine cultures ordered 07/22/12 Antibiotics: Zosyn and vancomycin 10/20>>  A:   1) Shock, cannot rule out sepsis from aspiration pneumonia, IV drug abuse. P:   - Antibiotics as above -continue levophed - f/u procalcitonin and lactic acid  ENDOCRINE  Lab 07/23/12 1218 07/23/12 0729  07/23/12 0359 07/23/12 0014 07/22/12 1824  GLUCAP 53* 70 75 82 159*   A:   1) No history of diabetes  P:   - POCT glucose q4hrs  NEUROLOGIC  A:   1) h/o polysubstance abuse Sedated on vent P:   - We will continue to monitor mental status. - continue fentanyl and versed  BEST PRACTICE / DISPOSITION - Level of Care:  ICU - Primary Service:  PCCM - Consultants:  Vascular surgery, orthopedic surgery, nephrology - Code Status:  Full code - Diet:  NPO - DVT Px:  SCD's - GI Px:  Protonix - Skin Integrity:  Intact - Social / Family:  Family updated at bedside.  Marena Chancy, PGy-2  Family Medicine Resident ICU rotation  07/23/2012, 1:42 PM

## 2012-07-23 NOTE — Progress Notes (Signed)
Pt with large amount of sanguinous drainage through adhesive bandage of RUE wound; Dr Lajoyce Corners notified; Order received to reinforce dressing and he will evaluate in the am: will con't to monitor  Burnard Bunting, RN

## 2012-07-23 NOTE — Progress Notes (Signed)
INITIAL ADULT NUTRITION ASSESSMENT Date: 07/23/2012   Time: 11:31 AM  Reason for Assessment: VDRF; Low Braden  INTERVENTION:  Recommend start TF via NG/OG tube (if unable to extubate patient within the next 24 hours) with Jevity 1.2 at 20 ml/h, increase by 10 ml every 4 hours to goal rate of 40 ml/h with Prostat 60 ml TID to provide 1752 kcals, 143 gm protein, 778 ml free water daily.  DOCUMENTATION CODES Per approved criteria  -Not Applicable   ASSESSMENT: Male 21 y.o.  Dx: Heroin and alcohol overdose; found unresponsive by friends; Rhabdomyolysis, RUE compartment syndrome; Acute renal failure; Hyperkalemia; Shock; Hypoxemia; S/P right arm fasciotomy and placement of a dialysis catheter on admission  Hx:  Past Medical History  Diagnosis Date  . Depression   . Anxiety   . Panic attacks    Heroin addiction  Alcohol abuse   Past Surgical History  Procedure Date  . Extraction of wisdom teeth     Related Meds:  Scheduled Meds:   . antiseptic oral rinse  15 mL Mouth Rinse QID  . calcium gluconate  1 g Intravenous Once  . chlorhexidine  15 mL Mouth Rinse BID  . dextrose      . dextrose  1 ampule Intravenous Once  . dextrose  1 ampule Intravenous Once  . insulin regular  10 Units Subcutaneous Once  . midazolam  2-4 mg Intravenous Once  . naloxone (NARCAN) injection  0.4 mg Intravenous Once  . norepinephrine      . ondansetron      . pantoprazole (PROTONIX) IV  40 mg Intravenous QHS  . piperacillin-tazobactam (ZOSYN)  IV  3.375 g Intravenous Q8H  . sodium chloride  1,000 mL Intravenous Once  . sodium chloride  1,000 mL Intravenous Once  . sodium chloride  1,000 mL Intravenous Once  . sodium polystyrene  60 g Rectal To OR  . vancomycin  1,000 mg Intravenous Q24H  . DISCONTD: ampicillin-sulbactam (UNASYN) IV  3 g Intravenous Once  . DISCONTD: heparin  5,000 Units Subcutaneous Q8H  . DISCONTD: insulin aspart  10 Units Intravenous Once   Continuous Infusions:   .  fentaNYL infusion INTRAVENOUS 100 mcg/hr (07/23/12 0340)  . midazolam (VERSED) infusion 4 mg/hr (07/23/12 1114)  . norepinephrine (LEVOPHED) Adult infusion Stopped (07/22/12 2204)  . dialysis replacement fluid (prismasate) 400 mL/hr at 07/23/12 0336  . dialysate (PRISMASATE) 1,500 mL/hr at 07/23/12 0812  .  sodium bicarbonate infusion 1000 mL    . sodium bicarbonate (isotonic) 1000 mL infusion 400 mL/hr at 07/23/12 1000   PRN Meds:.sodium chloride, fentaNYL, heparin, hydrALAZINE, labetalol, midazolam, midazolam, naloxone (NARCAN) injection, ondansetron, sodium chloride, DISCONTD: 0.9 % irrigation (POUR BTL), DISCONTD: heparin 6000 units/NS 500 mL - 200 mL/add papaverine 60 mg(33mL)irrigation, DISCONTD: iohexol   Ht: 5\' 7"  (170.2 cm)  Wt: 162 lb (73.483 kg)  Ideal Wt: 67.3 kg % Ideal Wt: 109%  Wt Readings from Last 10 Encounters:  07/22/12 162 lb (73.483 kg)  07/22/12 162 lb (73.483 kg)   Usual Wt: unknown  Body mass index is 25.37 kg/(m^2).  Food/Nutrition Related Hx: unknown  Labs:  CMP     Component Value Date/Time   NA 143 07/23/2012 0500   K 5.2* 07/23/2012 0500   CL 108 07/23/2012 0500   CO2 25 07/23/2012 0500   GLUCOSE 98 07/23/2012 0500   BUN 26* 07/23/2012 0500   CREATININE 2.34* 07/23/2012 0500   CALCIUM 7.0* 07/23/2012 0500   PROT 4.5* 07/23/2012 0500  ALBUMIN 2.7* 07/23/2012 0500   ALBUMIN 2.6* 07/23/2012 0500   AST 1133* 07/23/2012 0500   ALT 454* 07/23/2012 0500   ALKPHOS 65 07/23/2012 0500   BILITOT 2.0* 07/23/2012 0500   GFRNONAA 38* 07/23/2012 0500   GFRAA 43* 07/23/2012 0500    CBG (last 3)   Basename 07/23/12 0359 07/23/12 0014 07/22/12 1824  GLUCAP 75 82 159*    Sodium  Date/Time Value Range Status  07/23/2012  5:00 AM 143  135 - 145 mEq/L Final  07/23/2012  1:56 AM 141  135 - 145 mEq/L Final  07/22/2012 10:40 PM 142  135 - 145 mEq/L Final    Potassium  Date/Time Value Range Status  07/23/2012  5:00 AM 5.2* 3.5 - 5.1 mEq/L Final    07/23/2012  1:56 AM 5.9* 3.5 - 5.1 mEq/L Final     DELTA CHECK NOTED  07/22/2012 10:40 PM 8.2* 3.5 - 5.1 mEq/L Final    Phosphorus  Date/Time Value Range Status  07/23/2012  5:00 AM 5.4* 2.3 - 4.6 mg/dL Final  16/07/9603  5:40 AM 6.6* 2.3 - 4.6 mg/dL Final  98/08/9146  8:29 PM 18.7* 2.3 - 4.6 mg/dL Final    Magnesium  Date/Time Value Range Status  07/23/2012  1:56 AM 2.3  1.5 - 2.5 mg/dL Final  56/21/3086  5:78 PM 4.2* 1.5 - 2.5 mg/dL Final     Intake/Output Summary (Last 24 hours) at 07/23/12 1136 Last data filed at 07/23/12 1100  Gross per 24 hour  Intake 8302.66 ml  Output    951 ml  Net 7351.66 ml    Diet Order:  NPO  IVF:    fentaNYL infusion INTRAVENOUS Last Rate: 100 mcg/hr (07/23/12 0340)  midazolam (VERSED) infusion Last Rate: 4 mg/hr (07/23/12 1114)  norepinephrine (LEVOPHED) Adult infusion Last Rate: Stopped (07/22/12 2204)  dialysis replacement fluid (prismasate) Last Rate: 400 mL/hr at 07/23/12 0336  dialysate (PRISMASATE) Last Rate: 1,500 mL/hr at 07/23/12 4696  sodium bicarbonate infusion 1000 mL   sodium bicarbonate (isotonic) 1000 mL infusion Last Rate: 400 mL/hr at 07/23/12 1000    Estimated Nutritional Needs:   Kcal: 1850 Protein: 130-145 gm while receiving CRRT Fluid: 1.9-2.2 liters  Patient is currently intubated on ventilator support.  MV: 10.4 Temp:Temp (24hrs), Avg:96.3 F (35.7 C), Min:95.2 F (35.1 C), Max:97.8 F (36.6 C)  Patient is at nutrition risk given history of polysubstance abuse.  Noted recently in prison for drug related issues.  Nephrology is following for acute renal failure; is receiving CRRT.  Patient has increased protein needs due to CRRT and for healing of arm (compartment syndrome).  NUTRITION DIAGNOSIS: -Inadequate oral intake (NI-2.1).  Status: Ongoing  RELATED TO: inability to eat  AS EVIDENCED BY: NPO status  MONITORING/EVALUATION(Goals):  Goal:  Intake to meet >90% of estimated nutrition  needs.  Monitor:  TF initiation/tolerance/adequacy, weight trend, labs, I/O, vent status  EDUCATION NEEDS: -Education not appropriate at this time  Joaquin Courts, RD, LDN, CNSC Pager# [phone] After Hours Pager# [phone]  07/23/2012, 11:31 AM

## 2012-07-23 NOTE — Procedures (Signed)
Arterial Catheter Insertion Procedure Note DANTAVIOUS SNOWBALL [id number] [birth year]  Procedure: Insertion of Arterial Catheter  Indications: Blood pressure monitoring and Frequent blood sampling  Procedure Details Consent: Unable to obtain consent because of emergent medical necessity. Time Out: Verified patient identification, verified procedure, site/side was marked, verified correct patient position, special equipment/implants available, medications/allergies/relevent history reviewed, required imaging and test results available.  Performed  Maximum sterile technique was used including antiseptics, cap, gloves, hand hygiene and mask. Skin prep: Chlorhexidine; local anesthetic administered 20 gauge catheter was inserted into left radial artery using the Seldinger technique.  Evaluation Blood flow good; BP tracing good. Complications: No apparent complications.   Leafy Half 07/23/2012

## 2012-07-23 NOTE — Progress Notes (Signed)
07/23/12 1500  Clinical Encounter Type  Visited With Patient and family together  Visit Type Initial  Referral From Chaplain    I was referred to this patient from Karleen Hampshire Addo from his on-call visit on 10/20.  Patient was not conscious. I spoke to his mother. His sister and father arrived shortly after speaking to the mother. Follow up and visit patient and family again. Chaplain Vonzella Nipple

## 2012-07-23 NOTE — Consult Note (Signed)
Reason for Consult: Acute renal failure with metabolic acidosis and hyperkalemia Referring Physician: Overton Mam MD- CCM  Martin Chapman is an 21 y.o. male.  HPI:  21 years old Caucasian man with a past history of heroin addiction, depression, anxiety and panic attacks. Brought to ER yesterday by his friends (around [facility]) after found unresponsive overdosed on heroin and alcohol the night before. On admission he was unresponsive and maintained conscious on Narcan drip. He was hypotensive with MAPs in the 40's despite aggressive IVF resuscitation. Labs indicated profound metabolic acidosis/elevated lactate with hyperkalemia and associated EKG changes. Poor dark colored UOP noted in spite of large IVF boluses. Earlier tonight had fasciotomy and angiography  (75mL contrast volume) for right arm compartment syndrome with a right peri clavicular hematoma.   From admission history noted to be recently in prison for drug related issues.    Past Medical History  Diagnosis Date  . Depression   . Anxiety   . Panic attacks     Past Surgical History  Procedure Date  . Extraction of wisdom teeth     Family History  Problem Relation Age of Onset  . Hypertension Father     Social History:  reports that he has been smoking Cigarettes.  He has been smoking about 1 pack per day. He does not have any smokeless tobacco history on file. He reports that he drinks alcohol. He reports that he uses illicit drugs (Cocaine).  Allergies: No Known Allergies  Medications:  Scheduled:   . antiseptic oral rinse  15 mL Mouth Rinse QID  . calcium gluconate  1 g Intravenous Once  . chlorhexidine  15 mL Mouth Rinse BID  . dextrose      . dextrose  1 ampule Intravenous Once  . dextrose  1 ampule Intravenous Once  . insulin regular  10 Units Subcutaneous Once  . midazolam  2-4 mg Intravenous Once  . naloxone (NARCAN) injection  0.4 mg Intravenous Once  . norepinephrine      . ondansetron      .  pantoprazole (PROTONIX) IV  40 mg Intravenous QHS  . piperacillin-tazobactam (ZOSYN)  IV  3.375 g Intravenous Q8H  . sodium chloride  1,000 mL Intravenous Once  . sodium chloride  1,000 mL Intravenous Once  . sodium chloride  1,000 mL Intravenous Once  . sodium polystyrene  60 g Rectal To OR  . vancomycin  1,000 mg Intravenous Q24H  . DISCONTD: ampicillin-sulbactam (UNASYN) IV  3 g Intravenous Once  . DISCONTD: heparin  5,000 Units Subcutaneous Q8H  . DISCONTD: insulin aspart  10 Units Intravenous Once    Results for orders placed during the hospital encounter of 07/22/12 (from the past 48 hour(s))  URINE RAPID DRUG SCREEN (HOSP PERFORMED)     Status: Abnormal   Collection Time   07/22/12  4:32 PM      Component Value Range Comment   Opiates POSITIVE (*) NONE DETECTED    Cocaine NONE DETECTED  NONE DETECTED    Benzodiazepines NONE DETECTED  NONE DETECTED    Amphetamines NONE DETECTED  NONE DETECTED    Tetrahydrocannabinol NONE DETECTED  NONE DETECTED    Barbiturates NONE DETECTED  NONE DETECTED   URINALYSIS, ROUTINE W REFLEX MICROSCOPIC     Status: Normal   Collection Time   07/22/12  4:32 PM      Component Value Range Comment   Color, Urine YELLOW  YELLOW    APPearance CLEAR  CLEAR  Specific Gravity, Urine 1.007  1.005 - 1.030    pH 5.5  5.0 - 8.0    Glucose, UA NEGATIVE  NEGATIVE mg/dL    Hgb urine dipstick NEGATIVE  NEGATIVE    Bilirubin Urine NEGATIVE  NEGATIVE    Ketones, ur NEGATIVE  NEGATIVE mg/dL    Protein, ur NEGATIVE  NEGATIVE mg/dL    Urobilinogen, UA 0.2  0.0 - 1.0 mg/dL    Nitrite NEGATIVE  NEGATIVE    Leukocytes, UA NEGATIVE  NEGATIVE MICROSCOPIC NOT DONE ON URINES WITH NEGATIVE PROTEIN, BLOOD, LEUKOCYTES, NITRITE, OR GLUCOSE <1000 mg/dL.  LACTIC ACID, PLASMA     Status: Abnormal   Collection Time   07/22/12  4:38 PM      Component Value Range Comment   Lactic Acid, Venous 10.7 (*) 0.5 - 2.2 mmol/L   CBC     Status: Abnormal   Collection Time   07/22/12   4:38 PM      Component Value Range Comment   WBC 37.8 (*) 4.0 - 10.5 K/uL    RBC 5.91 (*) 4.22 - 5.81 MIL/uL    Hemoglobin 18.3 (*) 13.0 - 17.0 g/dL    HCT 16.1  09.6 - 04.5 %    MCV 85.4  78.0 - 100.0 fL    MCH 31.0  26.0 - 34.0 pg    MCHC 36.2 (*) 30.0 - 36.0 g/dL    RDW 40.9  81.1 - 91.4 %    Platelets 279  150 - 400 K/uL   COMPREHENSIVE METABOLIC PANEL     Status: Abnormal   Collection Time   07/22/12  4:38 PM      Component Value Range Comment   Sodium 144  135 - 145 mEq/L    Potassium 5.5 (*) 3.5 - 5.1 mEq/L    Chloride 100  96 - 112 mEq/L    CO2 14 (*) 19 - 32 mEq/L    Glucose, Bld 50 (*) 70 - 99 mg/dL    BUN 19  6 - 23 mg/dL    Creatinine, Ser 7.82 (*) 0.50 - 1.35 mg/dL    Calcium 8.2 (*) 8.4 - 10.5 mg/dL    Total Protein 7.6  6.0 - 8.3 g/dL    Albumin 4.7  3.5 - 5.2 g/dL    AST 956 (*) 0 - 37 U/L    ALT 462 (*) 0 - 53 U/L    Alkaline Phosphatase 289 (*) 39 - 117 U/L    Total Bilirubin 0.9  0.3 - 1.2 mg/dL    GFR calc non Af Amer 35 (*) >90 mL/min    GFR calc Af Amer 41 (*) >90 mL/min   ETHANOL     Status: Abnormal   Collection Time   07/22/12  4:38 PM      Component Value Range Comment   Alcohol, Ethyl (B) 45 (*) 0 - 11 mg/dL   GLUCOSE, CAPILLARY     Status: Abnormal   Collection Time   07/22/12  6:24 PM      Component Value Range Comment   Glucose-Capillary 159 (*) 70 - 99 mg/dL   LACTIC ACID, PLASMA     Status: Abnormal   Collection Time   07/22/12  7:20 PM      Component Value Range Comment   Lactic Acid, Venous 8.2 (*) 0.5 - 2.2 mmol/L   CK TOTAL AND CKMB     Status: Abnormal   Collection Time   07/22/12  8:20 PM  Component Value Range Comment   Total CK >50000 (*) 7 - 232 U/L RESULTS CONFIRMED BY MANUAL DILUTION   CK, MB >500.0 (*) 0.3 - 4.0 ng/mL    Relative Index NOT CALCULATED  0.0 - 2.5   TROPONIN I     Status: Abnormal   Collection Time   07/22/12  8:20 PM      Component Value Range Comment   Troponin I 0.62 (*) <0.30 ng/mL     COMPREHENSIVE METABOLIC PANEL     Status: Abnormal   Collection Time   07/22/12  8:21 PM      Component Value Range Comment   Sodium 140  135 - 145 mEq/L    Potassium >7.5 (*) 3.5 - 5.1 mEq/L    Chloride 101  96 - 112 mEq/L    CO2 14 (*) 19 - 32 mEq/L    Glucose, Bld 45 (*) 70 - 99 mg/dL    BUN 22  6 - 23 mg/dL    Creatinine, Ser 4.54 (*) 0.50 - 1.35 mg/dL    Calcium 7.0 (*) 8.4 - 10.5 mg/dL    Total Protein 6.5  6.0 - 8.3 g/dL    Albumin 4.1  3.5 - 5.2 g/dL    AST 098 (*) 0 - 37 U/L    ALT 468 (*) 0 - 53 U/L    Alkaline Phosphatase 226 (*) 39 - 117 U/L    Total Bilirubin 0.8  0.3 - 1.2 mg/dL    GFR calc non Af Amer 33 (*) >90 mL/min    GFR calc Af Amer 38 (*) >90 mL/min   MAGNESIUM     Status: Abnormal   Collection Time   07/22/12  8:21 PM      Component Value Range Comment   Magnesium 4.2 (*) 1.5 - 2.5 mg/dL   PHOSPHORUS     Status: Abnormal   Collection Time   07/22/12  8:21 PM      Component Value Range Comment   Phosphorus 18.7 (*) 2.3 - 4.6 mg/dL   AMYLASE     Status: Abnormal   Collection Time   07/22/12  8:21 PM      Component Value Range Comment   Amylase 312 (*) 0 - 105 U/L   LIPASE, BLOOD     Status: Abnormal   Collection Time   07/22/12  8:21 PM      Component Value Range Comment   Lipase 745 (*) 11 - 59 U/L   CBC WITH DIFFERENTIAL     Status: Abnormal   Collection Time   07/22/12  8:21 PM      Component Value Range Comment   WBC 32.6 (*) 4.0 - 10.5 K/uL    RBC 5.21  4.22 - 5.81 MIL/uL    Hemoglobin 15.8  13.0 - 17.0 g/dL    HCT 11.9  14.7 - 82.9 %    MCV 85.2  78.0 - 100.0 fL    MCH 30.3  26.0 - 34.0 pg    MCHC 35.6  30.0 - 36.0 g/dL    RDW 56.2  13.0 - 86.5 %    Platelets 232  150 - 400 K/uL    Neutrophils Relative 87 (*) 43 - 77 %    Neutro Abs 28.5 (*) 1.7 - 7.7 K/uL    Lymphocytes Relative 5 (*) 12 - 46 %    Lymphs Abs 1.5  0.7 - 4.0 K/uL    Monocytes Relative 8  3 - 12 %  Monocytes Absolute 2.5 (*) 0.1 - 1.0 K/uL    Eosinophils Relative  0  0 - 5 %    Eosinophils Absolute 0.0  0.0 - 0.7 K/uL    Basophils Relative 0  0 - 1 %    Basophils Absolute 0.1  0.0 - 0.1 K/uL   PROTIME-INR     Status: Abnormal   Collection Time   07/22/12  8:21 PM      Component Value Range Comment   Prothrombin Time 22.0 (*) 11.6 - 15.2 seconds    INR 2.01 (*) 0.00 - 1.49   APTT     Status: Abnormal   Collection Time   07/22/12  8:21 PM      Component Value Range Comment   aPTT 46 (*) 24 - 37 seconds   PROCALCITONIN     Status: Normal   Collection Time   07/22/12  8:23 PM      Component Value Range Comment   Procalcitonin 6.62     POCT I-STAT 7, (LYTES, BLD GAS, ICA,H+H)     Status: Abnormal   Collection Time   07/22/12 10:40 PM      Component Value Range Comment   pH, Arterial 7.179 (*) 7.350 - 7.450    pCO2 arterial 47.5 (*) 35.0 - 45.0 mmHg    pO2, Arterial 72.0 (*) 80.0 - 100.0 mmHg    Bicarbonate 17.7 (*) 20.0 - 24.0 mEq/L    TCO2 19  0 - 100 mmol/L    O2 Saturation 90.0      Acid-base deficit 11.0 (*) 0.0 - 2.0 mmol/L    Sodium 142  135 - 145 mEq/L    Potassium 8.2 (*) 3.5 - 5.1 mEq/L    Calcium, Ion 0.93 (*) 1.12 - 1.23 mmol/L    HCT 38.0 (*) 39.0 - 52.0 %    Hemoglobin 12.9 (*) 13.0 - 17.0 g/dL    Sample type ARTERIAL      Comment NOTIFIED PHYSICIAN       Dg Chest Portable 1 View  07/22/2012  *RADIOLOGY REPORT*  Clinical Data: Drug overdose.  PORTABLE CHEST - 1 VIEW  Comparison: 04/08/2012  Findings: Heart size and pulmonary vascularity are normal and the lungs are clear.  No osseous abnormality.  IMPRESSION: Normal chest.   Original Report Authenticated By: Gwynn Burly, M.D.    Dg Shoulder Right Port  07/22/2012  *RADIOLOGY REPORT*  Clinical Data: Right shoulder pain.  PORTABLE RIGHT SHOULDER - 2+ VIEW  Comparison: None.  Findings: No fracture, dislocation, or other abnormality.  IMPRESSION: Normal exam.   Original Report Authenticated By: Gwynn Burly, M.D.     Review of Systems  Unable to perform ROS:  intubated   Blood pressure 88/51, pulse 99, temperature 96.6 F (35.9 C), resp. rate 16, height 5\' 7"  (1.702 m), weight 73.483 kg (162 lb), SpO2 95.00%. Physical Exam  Nursing note and vitals reviewed. Constitutional: He appears well-developed and well-nourished. No distress.       Intubated and sedated s/p fasciotomy and angiography   HENT:  Head: Normocephalic.  Eyes: Pupils are equal, round, and reactive to light. No scleral icterus.  Neck: No JVD present. No thyromegaly present.       Intubated  Cardiovascular: Normal rate, regular rhythm and normal heart sounds.  Exam reveals no gallop and no friction rub.   No murmur heard. Respiratory: Effort normal and breath sounds normal. No respiratory distress. He has no wheezes. He has no rales. He exhibits no  tenderness.       On ventilator  GI: Soft. Bowel sounds are normal. He exhibits no distension and no mass. There is no tenderness. There is no rebound and no guarding.  Genitourinary:       Dark coca cola colored urine in urine bag  Musculoskeletal: Normal range of motion. He exhibits no edema and no tenderness.  Neurological:       Intubated/sedated  Skin: Skin is warm and dry. No rash noted. He is not diaphoretic. No erythema. No pallor.    Assessment/Plan: 1. Acute renal failure: History and timeline of events is suggestive of rhabdomyolysis/pigment associated nephropathy however with his critical hypotension cannot entirely rule out the possibility of ischemic ATN. Recent/necessary contrast exposure is likely also to contribute to injury with contrast-induced nephropathy. Given his critical hyperkalemia/profound metabolic acidosis and poor/minimal urinary output so far, we'll initiate urgent hemodialysis. To preserved hemodynamic stability, we'll do CR RT. Standard urine studies will be ordered including urinary electrolytes and renal ultrasound ordered. 2. Metabolic acidosis: Secondary to underlying acute renal failure and lactic  acidosis/ischemic tissue. Hemodialysis to correct his metabolic abnormalities. 3. Hyperkalemia: Secondary to acute renal failure and ischemic tissue/necrotic tissue with potassium release. Urgent hemodialysis to correct this life-threatening problem. 4. Right arm compartment syndrome: Status post fasciotomy and angiography-results noted, discussed with Dr. Darrick Penna results of earlier angiography. 5. Polysubstance abuse: Management after recovery from acute issues.  53 minutes of critical care time devoted towards assessing this patient, assessing his database and formulating a management plan.  Alyssa Rotondo K. 07/23/2012, 12:24 AM

## 2012-07-23 NOTE — Progress Notes (Signed)
Patient ID: Martin Chapman, male   DOB: 08/16/1991, 21 y.o.   MRN: [id number] Will plan for repeat debridement of right arm, tomorrow evening if medically stable.  Forearm soft, no swelling.

## 2012-07-23 NOTE — Transfer of Care (Signed)
Immediate Anesthesia Transfer of Care Note  Patient: Martin Chapman  Procedure(s) Performed: Procedure(s) (LRB) with comments: INTRA OPERATIVE ARTERIOGRAM (Left) - arch aortagram, second order catheterization right subclavian artery, ultrasound left groin FASCIOTOMY (Right) INSERTION OF DIALYSIS CATHETER (Right)  Patient Location: PACU and ICU  Anesthesia Type: General  Level of Consciousness: sedated and Patient remains intubated per anesthesia plan  Airway & Oxygen Therapy: Patient remains intubated per anesthesia plan and Patient placed on Ventilator (see vital sign flow sheet for setting)  Post-op Assessment: Report given to MICU RN and Post -op Vital signs reviewed and stable.Alicia,RN  Post vital signs: Reviewed and stable  Complications: No apparent anesthesia complications

## 2012-07-24 ENCOUNTER — Encounter (HOSPITAL_COMMUNITY): Payer: Self-pay | Admitting: Anesthesiology

## 2012-07-24 ENCOUNTER — Inpatient Hospital Stay (HOSPITAL_COMMUNITY): Payer: BC Managed Care – PPO

## 2012-07-24 ENCOUNTER — Inpatient Hospital Stay (HOSPITAL_COMMUNITY): Payer: BC Managed Care – PPO | Admitting: Anesthesiology

## 2012-07-24 ENCOUNTER — Encounter (HOSPITAL_COMMUNITY)
Admission: EM | Disposition: A | Discharge status: Home / Self Care | Payer: Self-pay | Source: Home / Self Care | Attending: Internal Medicine

## 2012-07-24 ENCOUNTER — Encounter (HOSPITAL_COMMUNITY): Payer: Self-pay | Admitting: Vascular Surgery

## 2012-07-24 DIAGNOSIS — F19988 Other psychoactive substance use, unspecified with other psychoactive substance-induced disorder: Secondary | ICD-10-CM

## 2012-07-24 DIAGNOSIS — T17908A Unspecified foreign body in respiratory tract, part unspecified causing other injury, initial encounter: Secondary | ICD-10-CM

## 2012-07-24 DIAGNOSIS — R4182 Altered mental status, unspecified: Secondary | ICD-10-CM

## 2012-07-24 DIAGNOSIS — N179 Acute kidney failure, unspecified: Secondary | ICD-10-CM

## 2012-07-24 DIAGNOSIS — F1010 Alcohol abuse, uncomplicated: Secondary | ICD-10-CM

## 2012-07-24 HISTORY — PX: I&D EXTREMITY: SHX5045

## 2012-07-24 HISTORY — PX: APPLICATION OF WOUND VAC: SHX5189

## 2012-07-24 LAB — CBC WITH DIFFERENTIAL/PLATELET
Basophils Absolute: 0 10*3/uL (ref 0.0–0.1)
Basophils Relative: 0 % (ref 0–1)
Eosinophils Absolute: 0 10*3/uL (ref 0.0–0.7)
Eosinophils Relative: 0 % (ref 0–5)
Lymphs Abs: 1.6 10*3/uL (ref 0.7–4.0)
MCH: 29 pg (ref 26.0–34.0)
MCHC: 35 g/dL (ref 30.0–36.0)
MCV: 82.9 fL (ref 78.0–100.0)
Platelets: 135 10*3/uL — ABNORMAL LOW (ref 150–400)
RDW: 13.3 % (ref 11.5–15.5)

## 2012-07-24 LAB — GLUCOSE, CAPILLARY
Glucose-Capillary: 114 mg/dL — ABNORMAL HIGH (ref 70–99)
Glucose-Capillary: 91 mg/dL (ref 70–99)
Glucose-Capillary: 88 mg/dL (ref 70–99)
Glucose-Capillary: 97 mg/dL (ref 70–99)

## 2012-07-24 LAB — RENAL FUNCTION PANEL
CO2: 35 mEq/L — ABNORMAL HIGH (ref 19–32)
Chloride: 92 mEq/L — ABNORMAL LOW (ref 96–112)
Creatinine, Ser: 3.15 mg/dL — ABNORMAL HIGH (ref 0.50–1.35)
GFR calc Af Amer: 30 mL/min — ABNORMAL LOW (ref >90)
GFR calc non Af Amer: 26 mL/min — ABNORMAL LOW (ref >90)

## 2012-07-24 LAB — URINE CULTURE

## 2012-07-24 LAB — HEPATIC FUNCTION PANEL
ALT: 389 U/L — ABNORMAL HIGH (ref 0–53)
AST: 1112 U/L — ABNORMAL HIGH (ref 0–37)
Bilirubin, Direct: 0.3 mg/dL (ref 0.0–0.3)

## 2012-07-24 LAB — PROCALCITONIN: Procalcitonin: 29.45 ng/mL

## 2012-07-24 LAB — CK: Total CK: >50000 U/L — ABNORMAL HIGH (ref 7–232)

## 2012-07-24 LAB — PROTIME-INR: Prothrombin Time: 19 seconds — ABNORMAL HIGH (ref 11.6–15.2)

## 2012-07-24 SURGERY — IRRIGATION AND DEBRIDEMENT EXTREMITY
Anesthesia: General | Site: Arm Upper | Laterality: Right | Wound class: Dirty or Infected

## 2012-07-24 MED ORDER — FENTANYL CITRATE 0.05 MG/ML IJ SOLN
INTRAMUSCULAR | Status: DC | PRN
  Administered 2012-07-24: 100 ug via INTRAVENOUS

## 2012-07-24 MED ORDER — PRISMASOL BGK 4/2.5 32-4-2.5 MEQ/L IV SOLN
INTRAVENOUS | Status: DC
  Administered 2012-07-24: 09:00:00 via INTRAVENOUS_CENTRAL
  Filled 2012-07-24 (×5): qty 5000

## 2012-07-24 MED ORDER — SODIUM CHLORIDE 0.9 % IV SOLN
2500.0000 ug | INTRAVENOUS | Status: DC | PRN
  Administered 2012-07-24: 100 ug/h via INTRAVENOUS

## 2012-07-24 MED ORDER — POTASSIUM CHLORIDE CRYS ER 20 MEQ PO TBCR: 40.0000 meq | EXTENDED_RELEASE_TABLET | Freq: Once | ORAL | Status: DC

## 2012-07-24 MED ORDER — PRISMASOL BGK 4/2.5 32-4-2.5 MEQ/L IV SOLN
INTRAVENOUS | Status: DC
  Administered 2012-07-24 (×3): via INTRAVENOUS_CENTRAL
  Filled 2012-07-24 (×15): qty 5000

## 2012-07-24 MED ORDER — MIDAZOLAM HCL 5 MG/5ML IJ SOLN
INTRAMUSCULAR | Status: DC | PRN
  Administered 2012-07-24: 2 mg via INTRAVENOUS

## 2012-07-24 MED ORDER — VANCOMYCIN HCL IN DEXTROSE 1-5 GM/200ML-% IV SOLN
1000.0000 mg | Freq: Once | INTRAVENOUS | Status: AC
  Administered 2012-07-24: 1000 mg via INTRAVENOUS
  Filled 2012-07-24: qty 200

## 2012-07-24 MED ORDER — POTASSIUM CHLORIDE 20 MEQ/15ML (10%) PO LIQD
ORAL | Status: AC
  Administered 2012-07-24: 40 meq
  Filled 2012-07-24: qty 30

## 2012-07-24 MED ORDER — POTASSIUM CHLORIDE 10 MEQ/50ML IV SOLN: 10.0000 meq | INTRAVENOUS | Status: DC

## 2012-07-24 MED ORDER — PIPERACILLIN-TAZOBACTAM 3.375 G IVPB 30 MIN
3.3750 g | Freq: Once | INTRAVENOUS | Status: AC
  Administered 2012-07-24: 3.375 g via INTRAVENOUS
  Filled 2012-07-24: qty 50

## 2012-07-24 MED ORDER — SODIUM CHLORIDE 0.9 % IV BOLUS (SEPSIS)
500.0000 mL | Freq: Once | INTRAVENOUS | Status: AC
  Administered 2012-07-24: 500 mL via INTRAVENOUS

## 2012-07-24 MED ORDER — SODIUM CHLORIDE 0.9 % IV SOLN
50.0000 mg | INTRAVENOUS | Status: DC | PRN
  Administered 2012-07-24 (×2): 6 mg/h via INTRAVENOUS

## 2012-07-24 MED ORDER — ARTIFICIAL TEARS OP OINT
TOPICAL_OINTMENT | OPHTHALMIC | Status: DC | PRN
  Administered 2012-07-24: 1 via OPHTHALMIC

## 2012-07-24 MED ORDER — ROCURONIUM BROMIDE 100 MG/10ML IV SOLN
INTRAVENOUS | Status: DC | PRN
  Administered 2012-07-24: 20 mg via INTRAVENOUS
  Administered 2012-07-24: 30 mg via INTRAVENOUS

## 2012-07-24 MED ORDER — LACTATED RINGERS IV SOLN
INTRAVENOUS | Status: DC | PRN
  Administered 2012-07-24: 18:00:00 via INTRAVENOUS

## 2012-07-24 MED ORDER — PRISMASOL BGK 4/2.5 32-4-2.5 MEQ/L IV SOLN
INTRAVENOUS | Status: DC
  Administered 2012-07-24: 09:00:00 via INTRAVENOUS_CENTRAL
  Filled 2012-07-24 (×4): qty 5000

## 2012-07-24 MED ORDER — POTASSIUM CHLORIDE 20 MEQ/15ML (10%) PO LIQD
60.0000 meq | Freq: Once | ORAL | Status: AC
  Administered 2012-07-24: 60 meq
  Filled 2012-07-24: qty 45

## 2012-07-24 MED ORDER — PIPERACILLIN-TAZOBACTAM 3.375 G IVPB
3.3750 g | Freq: Three times a day (TID) | INTRAVENOUS | Status: DC
  Administered 2012-07-24 – 2012-07-26 (×5): 3.375 g via INTRAVENOUS
  Filled 2012-07-24 (×7): qty 50

## 2012-07-24 MED ORDER — PROPOFOL 10 MG/ML IV BOLUS
INTRAVENOUS | Status: DC | PRN
  Administered 2012-07-24: 100 mg via INTRAVENOUS

## 2012-07-24 SURGICAL SUPPLY — 51 items
BLADE SURG 10 STRL SS (BLADE) ×2 IMPLANT
BNDG COHESIVE 4X5 TAN STRL (GAUZE/BANDAGES/DRESSINGS) ×2 IMPLANT
BNDG COHESIVE 6X5 TAN STRL LF (GAUZE/BANDAGES/DRESSINGS) ×4 IMPLANT
BNDG GAUZE STRTCH 6 (GAUZE/BANDAGES/DRESSINGS) ×6 IMPLANT
CLOTH BEACON ORANGE TIMEOUT ST (SAFETY) ×2 IMPLANT
COTTON STERILE ROLL (GAUZE/BANDAGES/DRESSINGS) ×2 IMPLANT
COVER SURGICAL LIGHT HANDLE (MISCELLANEOUS) ×2 IMPLANT
CUFF TOURNIQUET SINGLE 18IN (TOURNIQUET CUFF) ×2 IMPLANT
CUFF TOURNIQUET SINGLE 24IN (TOURNIQUET CUFF) IMPLANT
CUFF TOURNIQUET SINGLE 34IN LL (TOURNIQUET CUFF) IMPLANT
CUFF TOURNIQUET SINGLE 44IN (TOURNIQUET CUFF) IMPLANT
DRAPE INCISE IOBAN 66X45 STRL (DRAPES) ×8 IMPLANT
DRAPE U-SHAPE 47X51 STRL (DRAPES) ×2 IMPLANT
DRAPE U-SHAPE 76X120 STRL (DRAPES) ×2 IMPLANT
DRSG ADAPTIC 3X8 NADH LF (GAUZE/BANDAGES/DRESSINGS) ×2 IMPLANT
DRSG VAC ATS LRG SENSATRAC (GAUZE/BANDAGES/DRESSINGS) ×2 IMPLANT
DRSG VAC ATS SM SENSATRAC (GAUZE/BANDAGES/DRESSINGS) ×2 IMPLANT
DURAPREP 26ML APPLICATOR (WOUND CARE) IMPLANT
ELECT CAUTERY BLADE 6.4 (BLADE) ×2 IMPLANT
ELECT REM PT RETURN 9FT ADLT (ELECTROSURGICAL)
ELECTRODE REM PT RTRN 9FT ADLT (ELECTROSURGICAL) IMPLANT
GLOVE BIOGEL PI IND STRL 6.5 (GLOVE) ×5 IMPLANT
GLOVE BIOGEL PI IND STRL 9 (GLOVE) ×1 IMPLANT
GLOVE BIOGEL PI INDICATOR 6.5 (GLOVE) ×5
GLOVE BIOGEL PI INDICATOR 9 (GLOVE) ×1
GLOVE ORTHOPEDIC STR SZ6.5 (GLOVE) ×4 IMPLANT
GLOVE SURG ORTHO 9.0 STRL STRW (GLOVE) ×2 IMPLANT
GOWN PREVENTION PLUS XLARGE (GOWN DISPOSABLE) ×2 IMPLANT
GOWN SRG XL XLNG 56XLVL 4 (GOWN DISPOSABLE) ×1 IMPLANT
GOWN STRL NON-REIN XL XLG LVL4 (GOWN DISPOSABLE) ×2
HANDPIECE INTERPULSE COAX TIP (DISPOSABLE) ×2
KIT BASIN OR (CUSTOM PROCEDURE TRAY) ×2 IMPLANT
KIT ROOM TURNOVER OR (KITS) ×2 IMPLANT
MANIFOLD NEPTUNE II (INSTRUMENTS) ×2 IMPLANT
NS IRRIG 1000ML POUR BTL (IV SOLUTION) ×2 IMPLANT
PACK ORTHO EXTREMITY (CUSTOM PROCEDURE TRAY) ×2 IMPLANT
PAD ARMBOARD 7.5X6 YLW CONV (MISCELLANEOUS) ×4 IMPLANT
PADDING CAST COTTON 6X4 STRL (CAST SUPPLIES) ×2 IMPLANT
SET COLLECT BLD 21X3/4 12 PB (MISCELLANEOUS) ×2 IMPLANT
SET HNDPC FAN SPRY TIP SCT (DISPOSABLE) ×1 IMPLANT
SPONGE GAUZE 4X4 12PLY (GAUZE/BANDAGES/DRESSINGS) ×2 IMPLANT
SPONGE LAP 18X18 X RAY DECT (DISPOSABLE) ×2 IMPLANT
STOCKINETTE IMPERVIOUS 9X36 MD (GAUZE/BANDAGES/DRESSINGS) ×2 IMPLANT
SUT ETHILON 2 0 PSLX (SUTURE) ×2 IMPLANT
TOWEL OR 17X24 6PK STRL BLUE (TOWEL DISPOSABLE) ×2 IMPLANT
TOWEL OR 17X26 10 PK STRL BLUE (TOWEL DISPOSABLE) ×2 IMPLANT
TUBE ANAEROBIC SPECIMEN COL (MISCELLANEOUS) IMPLANT
TUBE CONNECTING 12X1/4 (SUCTIONS) ×2 IMPLANT
UNDERPAD 30X30 INCONTINENT (UNDERPADS AND DIAPERS) ×2 IMPLANT
WATER STERILE IRR 1000ML POUR (IV SOLUTION) ×2 IMPLANT
YANKAUER SUCT BULB TIP NO VENT (SUCTIONS) ×2 IMPLANT

## 2012-07-24 NOTE — Progress Notes (Signed)
Echocardiogram 2D Echocardiogram has been performed.  Martin Chapman 07/24/2012, 12:43 PM

## 2012-07-24 NOTE — Op Note (Signed)
OPERATIVE REPORT  DATE OF SURGERY: 07/24/2012  PATIENT:  Martin Chapman,  21 y.o. male  PRE-OPERATIVE DIAGNOSIS:  Compartment Syndrome Right Arm  POST-OPERATIVE DIAGNOSIS:  Compartment Syndrome Right Arm  PROCEDURE:  Procedure(s): IRRIGATION AND DEBRIDEMENT EXTREMITY Excision biceps muscle right arm. Neurolysis median nerve. Secondary closure of triceps and biceps incision total incision length 50 cm. Application of wound VAC surface area greater than 50 cm   SURGEON:  Surgeon(s): Nadara Mustard, MD  ANESTHESIA:   general  EBL:  Minimal ML  SPECIMEN:  No Specimen  TOURNIQUET:  * No tourniquets in log *  PROCEDURE DETAILS: Patient is a 22 year old gentleman status post compartment syndrome from a crush injury to the right upper extremity from the patient being unconscious from drugs and alcohol. Patient initially underwent aggressive surgical intervention on the day of admission and presents at this time approximately 48 hours later for repeat irrigation and debridement. Risks and benefits were discussed with the patient's family with the risk of a progressive necrosis of the muscle lack of function to the hand do to prolonged ischemia time. Patient's family state to understand and wish to proceed at this time. Description of procedure patient was brought straight back to the operating room from the ICU. The right upper extremity was prepped using Betadine and draped into a sterile field. The anterior and posterior steer incisions over the triceps and biceps were irrigated with pulsatile lavage. The triceps muscle appeared viable and had very little amount of clot contractility with electrocautery but the muscle did appear viable. The wound was secondarily closed leaving approximate a centimeter gap with 2-0 nylon. Attention was then focused on the biceps. The biceps entire muscle was necrotic. This was excised proximal to distal. The brachialis beneath the biceps appeared ischemic  and had minimal contractility with electrocautery but it was left in place to see if this may regain some viability. The median nerve underwent a dura lysis. The wound was again irrigated pulsatile lavage the wound was loosely closed to cover the cephalic vein. This was loosely closed with 2-0 nylon. A wound VAC was placed over the biceps and triceps incisions covered with Ioban there was a bone bridge between the 2 sponges to make this 1 suction unit and this was set to -75 mm of continuous suction and patient had good suction at the completion of surgery. Patient was taken directly back to the ICU.  PLAN OF CARE: Admit to inpatient   PATIENT DISPOSITION:  ICU - intubated and critically ill.   Nadara Mustard, MD 07/24/2012 6:54 PM

## 2012-07-24 NOTE — Anesthesia Preprocedure Evaluation (Addendum)
Anesthesia Evaluation    Reviewed: Allergy & Precautions, H&P , NPO status , Patient's Chart, lab work & pertinent test results  Airway       Dental   Pulmonary  IMPRESSION: Satisfactory position of endotracheal tube.   Small bilateral pleural effusions and low lung volumes. Improved aeration of the right upper lung field compared to the 07/23/2012 chest radiograph.              Cardiovascular Rhythm:regular Rate:Tachycardia     Neuro/Psych PSYCHIATRIC DISORDERS Anxiety Depression  Neuromuscular disease    GI/Hepatic (+) Hepatitis -  Endo/Other    Renal/GU ARF and DialysisRenal disease     Musculoskeletal   Abdominal   Peds  Hematology   Anesthesia Other Findings Pt s/p OD'd  Compartment syndrome upper extremity  Reproductive/Obstetrics                          Anesthesia Physical Anesthesia Plan  ASA: III  Anesthesia Plan: General   Post-op Pain Management:    Induction: Intravenous  Airway Management Planned:   Additional Equipment:   Intra-op Plan:   Post-operative Plan:   Informed Consent:   Plan Discussed with: CRNA, Anesthesiologist and Surgeon  Anesthesia Plan Comments:         Anesthesia Quick Evaluation

## 2012-07-24 NOTE — Progress Notes (Signed)
Sedated on ventilator  Filed Vitals:   07/24/12 1000 07/24/12 1100 07/24/12 1137 07/24/12 1140  BP:   107/53 105/52  Pulse: 95 96 91 91  Temp: 99.2 F (37.3 C) 99.5 F (37.5 C)    TempSrc:      Resp: 8 14 14 14   Height:      Weight:      SpO2: 100% 100% 100% 100%    2+ Radial pulse right arm Left groin no hematoma Femoral dialysis catheter working  Will sign off Please call if needs tunneled catheter or permanent access  Fabienne Bruns, MD [facility] Office: [phone] Pager: [phone]

## 2012-07-24 NOTE — Progress Notes (Signed)
Patient ID: Martin Chapman, male   DOB: [birth year], 21 y.o.   MRN: [id number]  Right forearm soft, not swollen.  Plan for repeat excisional debridement right arm this afternoon.  Will plan on closing the posterior wound and placing a wound VAC on the anterior wound.  Will need father or other family member to sign permit.

## 2012-07-24 NOTE — Progress Notes (Signed)
Name: Martin Chapman MRN: [id number] DOB: 05-23-1991    LOS: 2  PULMONARY / CRITICAL CARE MEDICINE  HPI:  21 years old male with PMH relevant for heroin addiction, depression, anxiety and panic attacks. He overdosed on heroin and alcohol 10/19 PM  And 10/20 PM he was found unresponsive at about 2:00 pm. Pt now on vent, s/p compartment syndrome and release in OR of R shoulder muscles.   Events since admission: 10/20- Admitted to ICU 10/20 - Surgery with shoulder release and fasciotomy of right arm 10/21 - Started on CVVH  Current Status: Remains critically ill and requires vent  Vital Signs: Temp:  [96.5 F (35.8 C)-99.9 F (37.7 C)] 99.6 F (37.6 C) (10/22 0600) Pulse Rate:  [100-117] 101  (10/22 0600) Resp:  [11-20] 20  (10/22 0600) BP: (81-137)/(56-68) 81/58 mmHg (10/22 0329) SpO2:  [95 %-100 %] 100 % (10/22 0600) Arterial Line BP: (90-152)/(50-90) 116/58 mmHg (10/22 0600) FiO2 (%):  [40 %] 40 % (10/22 0329)  Physical Examination: General: sedated on vent, no acute distress  Neuro: sedated on vent. Opens eyes, moves all extremities but does not follow commands HEENT:  PERRL, pink conjunctivae, dry mucous membranes, ETT in place Neck:  Supple, no JVD   Cardiovascular:  RRR, no M/R/G Lungs:  CTA Abdomen:  Soft, nontender, nondistended, bowel sounds present Musculoskeletal: Dressing on R shoulder/ Right arm with bloody drainage. Right hand warm and soft Skin:  No rash  Active problems: 1) Drug overdose (heroin, alcohol) 2) Rhabdomyolysis likely secondary to right arm injury 3) Right upper extremity compartment syndrome. Possible vascular injury 4) Acute renal failure secondary to volume depletion and rhabdomyolysis 5) Hyperkalemia 6) Elevated LFT's and INR, likely shock liver 7) Shock, likely hypovolemic, possibly septic. 8) Hypoxemia likely secondary to aspiration pneumonia / chemical pneumonitis  ASSESSMENT AND PLAN  PULMONARY  Lab 07/23/12 0049 07/22/12  2337 07/22/12 2240  PHART 7.266* 7.229* 7.179*  PCO2ART 36.1 44.2 47.5*  PO2ART 85.3 59.0* 72.0*  HCO3 15.9* 18.9* 17.7*  O2SAT 96.8 88.0 90.0   Ventilator Settings: Vent Mode:  [-] PRVC FiO2 (%):  [40 %] 40 % Set Rate:  [20 bmp] 20 bmp Vt Set:  [530 mL] 530 mL PEEP:  [5 cmH20] 5 cmH20 Plateau Pressure:  [10 cmH20-23 cmH20] 20 cmH20  CXR: 10/22 No acute infiltrates, ETT in place. Pt rotated   A:   1) Hypoxemia likely secondary to aspiration pneumonia / pneumonitis P:   -Cont full vent support -WUA/SBT daily -PCXR in AM   CARDIOVASCULAR  Lab 07/24/12 0425 07/23/12 1550 07/23/12 0800 07/23/12 0050 07/22/12 2020 07/22/12 1920 07/22/12 1638  TROPONINI 1.91* 3.24* 1.78* 1.07* 0.62* -- --  LATICACIDVEN 2.4* -- -- -- -- 8.2* 10.7*  PROBNP -- -- -- -- -- -- --   ECG:  NSR Lines: Left IJ placed 07/22/12 >> R femoral HD cath 10/20>> Art line L radial 10/21>>  A:  1) Hypovolemic shock. Cannot rule out sepsis 2) Lactic acidosis likely secondary to shock and rhabdomyolysis 3) Right upper extremity injury with compartment syndrome and possible vascular injury #1 and 2 improved AM 10/21, off vasopressors Note arch aortogram in OR normal.  No vascular deficit in RUE/shoulder P:  - Orthopedic surgery and vascular surgery input appreciated - Now off vasopressors - Lactic acid improved to 2.4  - CK remains >50k likely secondary to rhabdo - Troponin elevated, but now trending down - Consider echo in future given IV drug use  RENAL  Lab 07/24/12 0425  07/23/12 1555 07/23/12 0500 07/23/12 0156 07/22/12 2337 07/22/12 2021  NA 138 142 143 141 146* --  K 2.9* 3.6 -- -- -- --  CL 92* 100 108 109 -- 101  CO2 35* 32 25 24 -- 14*  BUN 27* 27* 26* 24* -- 22  CREATININE 3.15* 2.72* 2.34* 2.22* -- 2.61*  CALCIUM 7.0* 7.1* 7.0* 6.8* -- 7.0*  MG 1.9 -- -- 2.3 -- 4.2*  PHOS 2.8 4.6 5.4* 6.6* -- 18.7*   Intake/Output      10/21 0701 - 10/22 0700   I.V. (mL/kg) 1226.8 (16.7)   NG/GT 90     IV Piggyback 322.5   Total Intake(mL/kg) 1639.3 (22.3)   Urine (mL/kg/hr) 48 (0)   Emesis/NG output 55   Other 1961   Total Output 2064   Net -424.7        Foley:  07/22/12  A:   1) Acute renal failure ON CRRT 2) Rhabdomyolysis 3) Hyperkalemia>>>better. Now Hypokalemic P:   - Now on CRRT. HD cath placed in OR by Vascular Surgery - Hypokalemia > K given per tube. Likely increase K+ in bath - CRRT per renal   GASTROINTESTINAL  Lab 07/24/12 0425 07/23/12 1555 07/23/12 0500 07/23/12 0156 07/22/12 2021 07/22/12 1638  AST 1112* -- 1133* 1056* 627* 588*  ALT 389* -- 454* 444* 468* 462*  ALKPHOS 71 -- 65 63 226* 289*  BILITOT 2.4* -- 2.0* 1.3* 0.8 0.9  PROT 4.4* -- 4.5* 4.1* 6.5 7.6  ALBUMIN 2.4*2.3* 2.7* 2.7*2.6* 2.6* 4.1 --    A:   1) Elevated LFT's and INR likely shock liver P:   - INR improving. LFT stable - Will continue to monitor; repeat chem in AM - Will use SCD's for DVT prophylaxis - GI prophylaxis with Protonix   HEMATOLOGIC  Lab 07/24/12 0425 07/23/12 0500 07/23/12 0050 07/22/12 2337 07/22/12 2240 07/22/12 2021 07/22/12 1638  HGB 10.7* 13.6 -- 9.9* 12.9* 15.8 --  HCT 30.6* 38.4* -- 29.0* 38.0* 44.4 --  PLT 135* 179 124* -- -- 232 279  INR 1.65* 1.64* 2.39* -- -- 2.01* --  APTT 32 -- 43* -- -- 46* --   A:   1) Elevated INR likely from shock liver P:  - D.dimer elevated, fibrinogen low - HgB stable - Repeat CBC in AM   INFECTIOUS  Lab 07/24/12 0425 07/23/12 1200 07/23/12 0500 07/22/12 2023 07/22/12 2021 07/22/12 1638  WBC 17.4* -- 27.8* -- 32.6* 37.8*  PROCALCITON 29.45 31.40 -- 6.62 -- --   Cultures: Blood culture 10/20>> Urine culture 10/20>>  Antibiotics: Zosyn and vancomycin 10/20  A:   1) Shock, cannot rule out sepsis from aspiration pneumonia, IV drug abuse. P:   - Antibiotics as above - WBC improving - s/p fasciotomy. Return to OR today per ortho    ENDOCRINE  Lab 07/24/12 0408 07/24/12 0004 07/23/12 2024 07/23/12  1558 07/23/12 1255  GLUCAP 114* 106* 91 126* 87   A:   1) No history of diabetes . Low CBGs poss d/t liver disease  P:   - POCT glucose q4hrs - Continue IVF to D5NS at 50/hr   NEUROLOGIC  A:   1) sedated on vent  P:   -sedation protocol   BEST PRACTICE / DISPOSITION - Level of Care:  ICU - Primary Service:  PCCM - Consultants:  Vascular surgery, orthopedic surgery, nephrology - Code Status:  Full code - Diet:  NPO - DVT Px:  SCD's - GI Px:  Protonix -  Skin Integrity:  Intact - Social / Family:  No family at bedside this am  [employer]. [place], M.D. 07/24/2012 6:55 AM   21 yo admitted with acute encephalopathy, sepsis, aspiration pneumonitis with VDRF, acute renal failure from drug overdose, Rt arm compartment syndrome, elevated LFT's, and rhabdo.  Continue full vent support.  CRRT per renal.  Further debridement Rt arm per ortho.  Continue Abx.  Updated father at bedside.  I have independently reviewed chart, examined pt, and formulated assessment/plan in discussion with residents.  Critical care time 35 minutes.  Coralyn Helling, MD [facility] Pulmonary/Critical Care 07/24/2012, 2:15 PM Pager:  [phone] After 3pm call: [phone]

## 2012-07-24 NOTE — Progress Notes (Signed)
07/24/12 1300  Clinical Encounter Type  Visited With Patient and family together  Visit Type Follow-up    I followed up to visit the patient and family. I spoke to the father and sister. I will follow up with the family on Wednesday. Patient is still not conscious.  Vonzella Nipple Chaplain

## 2012-07-24 NOTE — Progress Notes (Signed)
[facility]  Subjective:  Intubated, sedated, a little agitated when sedation wears off.  Sched for OR this afternoon.  Remains on CVVH at (-) 25 cc/hr.  Off pressors.   Responds to questions.     Objective: Vital signs in last 24 hours: Blood pressure 81/58, pulse 100, temperature 99.5 F (37.5 C), temperature source Core (Comment), resp. rate 20, height 5\' 7"  (1.702 m), weight 73.483 kg (162 lb), SpO2 100.00%.    PHYSICAL EXAM General--as above Chest--rhonchi Heart--no rub Abd--nontender Extr--s/p fasciotomy R UE, HD cath R fem, no edema  Lab Results:   Lab 07/24/12 0425 07/23/12 1555 07/23/12 0500  NA 138 142 143  K 2.9* 3.6 5.2*  CL 92* 100 108  CO2 35* 32 25  BUN 27* 27* 26*  CREATININE 3.15* 2.72* 2.34*  ALB -- -- --  GLUCOSE 112* -- --  CALCIUM 7.0* 7.1* 7.0*  PHOS 2.8 4.6 5.4*     Basename 07/24/12 0425 07/23/12 0500  WBC 17.4* 27.8*  HGB 10.7* 13.6  HCT 30.6* 38.4*  PLT 135* 179    Assessment/Plan: 1.  ARF--remains on CVVH.  BP better.  K no longer elevated (will need extra K plus change Prismasate to 4K).  Continue this till he goes to OR.  Then intermittent HD as needed 2.  Metabolic acidosis--no ABG since yesterday.  Serum bicarb 35 today. 3. Hyperkalemia--low K now.  See  Above 4.  R compartment syndrome--Dr. Lajoyce Corners plans to take back to OR this afternoon for  Repeat excisional debridement 5.  Polysubstance abuse (heroin and alcohol)--mgt after recovery from acute issues   LOS: 2 days   Kalasia Crafton F 07/24/2012,8:06 AM   .labalb

## 2012-07-24 NOTE — Transfer of Care (Signed)
Immediate Anesthesia Transfer of Care Note  Patient: Martin Chapman  Procedure(s) Performed: Procedure(s) (LRB) with comments: IRRIGATION AND DEBRIDEMENT EXTREMITY (Right) - Right Arm Irrigation and Debridement, VAC Application APPLICATION OF WOUND VAC (Right)  Patient Location: ICU  Anesthesia Type: General  Level of Consciousness: sedated and Patient remains intubated per anesthesia plan  Airway & Oxygen Therapy: Patient remains intubated per anesthesia plan and Patient placed on Ventilator (see vital sign flow sheet for setting)  Post-op Assessment: Report given to PACU RN and Post -op Vital signs reviewed and stable  Post vital signs: Reviewed and stable  Complications: No apparent anesthesia complications

## 2012-07-24 NOTE — Progress Notes (Signed)
Pt back from the OR , vitals stable. Report received from OR nurse. Pt Sinus rhythm. Pt right arm is to Wound Vac, no leaks noted. Pt still sedated on Versed and Fentanyl.

## 2012-07-24 NOTE — Preoperative (Signed)
Beta Blockers   Reason not to administer Beta Blockers:Not Applicable 

## 2012-07-25 ENCOUNTER — Inpatient Hospital Stay (HOSPITAL_COMMUNITY): Payer: BC Managed Care – PPO

## 2012-07-25 ENCOUNTER — Encounter (HOSPITAL_COMMUNITY): Payer: Self-pay | Admitting: Orthopedic Surgery

## 2012-07-25 DIAGNOSIS — M6282 Rhabdomyolysis: Secondary | ICD-10-CM

## 2012-07-25 DIAGNOSIS — J95821 Acute postprocedural respiratory failure: Secondary | ICD-10-CM

## 2012-07-25 DIAGNOSIS — T79A19A Traumatic compartment syndrome of unspecified upper extremity, initial encounter: Secondary | ICD-10-CM

## 2012-07-25 LAB — RENAL FUNCTION PANEL
Calcium: 7.6 mg/dL — ABNORMAL LOW (ref 8.4–10.5)
GFR calc Af Amer: 17 mL/min — ABNORMAL LOW (ref >90)
GFR calc non Af Amer: 15 mL/min — ABNORMAL LOW (ref >90)
Phosphorus: 3.1 mg/dL (ref 2.3–4.6)
Sodium: 140 mEq/L (ref 135–145)

## 2012-07-25 LAB — CBC
Hemoglobin: 8.1 g/dL — ABNORMAL LOW (ref 13.0–17.0)
MCH: 28.6 pg (ref 26.0–34.0)
MCH: 29.1 pg (ref 26.0–34.0)
MCHC: 33.6 g/dL (ref 30.0–36.0)
MCHC: 33.9 g/dL (ref 30.0–36.0)
MCV: 85.2 fL (ref 78.0–100.0)
MCV: 85.7 fL (ref 78.0–100.0)
Platelets: 77 10*3/uL — ABNORMAL LOW (ref 150–400)
RDW: 13.3 % (ref 11.5–15.5)
WBC: 9.1 10*3/uL (ref 4.0–10.5)

## 2012-07-25 LAB — GLUCOSE, CAPILLARY
Glucose-Capillary: 92 mg/dL (ref 70–99)
Glucose-Capillary: 96 mg/dL (ref 70–99)
Glucose-Capillary: 139 mg/dL — ABNORMAL HIGH (ref 70–99)

## 2012-07-25 LAB — BASIC METABOLIC PANEL
CO2: 29 mEq/L (ref 19–32)
GFR calc non Af Amer: 15 mL/min — ABNORMAL LOW (ref >90)
Glucose, Bld: 87 mg/dL (ref 70–99)
Potassium: 3.6 mEq/L (ref 3.5–5.1)
Sodium: 141 mEq/L (ref 135–145)

## 2012-07-25 LAB — HEPATIC FUNCTION PANEL
Alkaline Phosphatase: 70 U/L (ref 39–117)
Bilirubin, Direct: 0.4 mg/dL — ABNORMAL HIGH (ref 0.0–0.3)
Indirect Bilirubin: 1 mg/dL — ABNORMAL HIGH (ref 0.3–0.9)
Total Protein: 4.5 g/dL — ABNORMAL LOW (ref 6.0–8.3)

## 2012-07-25 MED ORDER — CHLORHEXIDINE GLUCONATE CLOTH 2 % EX PADS
6.0000 | MEDICATED_PAD | Freq: Every day | CUTANEOUS | Status: DC
  Administered 2012-07-25 – 2012-08-13 (×18): 6 via TOPICAL

## 2012-07-25 MED ORDER — HEPARIN SODIUM (PORCINE) 5000 UNIT/ML IJ SOLN
5000.0000 [IU] | Freq: Three times a day (TID) | INTRAMUSCULAR | Status: DC
  Administered 2012-07-25 – 2012-08-12 (×52): 5000 [IU] via SUBCUTANEOUS
  Filled 2012-07-25 (×62): qty 1

## 2012-07-25 MED ORDER — PANTOPRAZOLE SODIUM 40 MG PO PACK
40.0000 mg | PACK | ORAL | Status: DC
  Administered 2012-07-25 – 2012-07-27 (×3): 40 mg
  Filled 2012-07-25 (×4): qty 20

## 2012-07-25 MED ORDER — OSMOLITE 1.5 CAL PO LIQD
1000.0000 mL | ORAL | Status: DC
  Administered 2012-07-25 – 2012-07-28 (×3): 1000 mL
  Filled 2012-07-25 (×9): qty 1000

## 2012-07-25 MED ORDER — SODIUM CHLORIDE 0.9 % IV SOLN
250.0000 mL | INTRAVENOUS | Status: DC | PRN
  Administered 2012-07-27 – 2012-07-30 (×2): 250 mL via INTRAVENOUS
  Administered 2012-08-02: 1000 mL via INTRAVENOUS

## 2012-07-25 MED ORDER — PRO-STAT SUGAR FREE PO LIQD
30.0000 mL | Freq: Three times a day (TID) | ORAL | Status: DC
  Administered 2012-07-25 – 2012-07-28 (×11): 30 mL
  Filled 2012-07-25 (×14): qty 30

## 2012-07-25 NOTE — Anesthesia Postprocedure Evaluation (Signed)
  Anesthesia Post-op Note  Patient: Martin Chapman  Procedure(s) Performed: Procedure(s) (LRB) with comments: IRRIGATION AND DEBRIDEMENT EXTREMITY (Right) - Right Arm Irrigation and Debridement, VAC Application APPLICATION OF WOUND VAC (Right)  Patient Location: ICU  Anesthesia Type: General  Level of Consciousness: Patient remains intubated per anesthesia plan  Airway and Oxygen Therapy: Patient remains intubated per anesthesia plan and Patient placed on Ventilator (see vital sign flow sheet for setting)  Post-op Pain: none  Post-op Assessment: Post-op Vital signs reviewed, Patient's Cardiovascular Status Stable, Respiratory Function Stable, Patent Airway, No signs of Nausea or vomiting and Pain level controlled  Post-op Vital Signs: stable  Complications: No apparent anesthesia complications

## 2012-07-25 NOTE — Progress Notes (Signed)
Nutrition Follow-up  Intervention:    Start Osmolite 1.5 via OG tube at 20 ml/h, increase by 10 ml every 4 hours to goal rate of 50 ml/h with Prostat 30 ml TID to provide 2100 kcals, 120 gm protein, 914 ml free water daily.  Assessment:   S/P debridement of right upper arm yesterday evening.  Wound VAC is in place.  No longer receiving CRRT.  OG tube is in place.  Received consult for TF initiation and management.    Patient remains intubated on ventilator support.  MV: 7.1 Temp:Temp (24hrs), Avg:98.9 F (37.2 C), Min:96.4 F (35.8 C), Max:101.4 F (38.6 C)   Diet Order: NPO  Meds: Scheduled Meds:   . antiseptic oral rinse  15 mL Mouth Rinse QID  . chlorhexidine  15 mL Mouth Rinse BID  . Chlorhexidine Gluconate Cloth  6 each Topical Q0600  . dextrose  1 ampule Intravenous Once  . heparin subcutaneous  5,000 Units Subcutaneous Q8H  . insulin regular  10 Units Subcutaneous Once  . pantoprazole (PROTONIX) IV  40 mg Intravenous QHS  . piperacillin-tazobactam  3.375 g Intravenous Once  . piperacillin-tazobactam (ZOSYN)  IV  3.375 g Intravenous Q8H  . potassium chloride      . sodium chloride  1,000 mL Intravenous Once  . vancomycin  1,000 mg Intravenous Once  . DISCONTD: piperacillin-tazobactam  3.375 g Intravenous Q6H  . DISCONTD: potassium chloride  40 mEq Oral Once  . DISCONTD: vancomycin  1,000 mg Intravenous Q24H   Continuous Infusions:   . dextrose 5 % and 0.9% NaCl 50 mL/hr at 07/23/12 1230  . fentaNYL infusion INTRAVENOUS 100 mcg/hr (07/25/12 0800)  . midazolam (VERSED) infusion 4 mg/hr (07/25/12 0800)  . norepinephrine (LEVOPHED) Adult infusion Stopped (07/22/12 2204)  . dialysis replacement fluid (prismasate) 400 mL/hr at 07/24/12 0920  . dialysis replacement fluid (prismasate) 400 mL/hr at 07/24/12 0927  . dialysate (PRISMASATE) 1,500 mL/hr at 07/24/12 1719   PRN Meds:.sodium chloride, fentaNYL, heparin, hydrALAZINE, labetalol, midazolam, midazolam, naloxone  (NARCAN) injection, ondansetron, sodium chloride, DISCONTD: sodium chloride  Labs:  CMP     Component Value Date/Time   NA 140 07/25/2012 0443   NA 141 07/25/2012 0443   K 3.6 07/25/2012 0443   K 3.6 07/25/2012 0443   CL 98 07/25/2012 0443   CL 99 07/25/2012 0443   CO2 31 07/25/2012 0443   CO2 29 07/25/2012 0443   GLUCOSE 87 07/25/2012 0443   GLUCOSE 87 07/25/2012 0443   BUN 36* 07/25/2012 0443   BUN 36* 07/25/2012 0443   CREATININE 5.11* 07/25/2012 0443   CREATININE 5.10* 07/25/2012 0443   CALCIUM 7.6* 07/25/2012 0443   CALCIUM 7.6* 07/25/2012 0443   PROT 4.5* 07/25/2012 0443   ALBUMIN 2.3* 07/25/2012 0443   ALBUMIN 2.3* 07/25/2012 0443   AST 968* 07/25/2012 0443   ALT 290* 07/25/2012 0443   ALKPHOS 70 07/25/2012 0443   BILITOT 1.4* 07/25/2012 0443   GFRNONAA 15* 07/25/2012 0443   GFRNONAA 15* 07/25/2012 0443   GFRAA 17* 07/25/2012 0443   GFRAA 17* 07/25/2012 0443    Sodium  Date/Time Value Range Status  07/25/2012  4:43 AM 140  135 - 145 mEq/L Final  07/25/2012  4:43 AM 141  135 - 145 mEq/L Final  07/24/2012  4:25 AM 138  135 - 145 mEq/L Final  07/23/2012  3:55 PM 142  135 - 145 mEq/L Final    Potassium  Date/Time Value Range Status  07/25/2012  4:43 AM 3.6  3.5 - 5.1 mEq/L Final  07/25/2012  4:43 AM 3.6  3.5 - 5.1 mEq/L Final  07/24/2012  4:25 AM 2.9* 3.5 - 5.1 mEq/L Final     DELTA CHECK NOTED  07/23/2012  3:55 PM 3.6  3.5 - 5.1 mEq/L Final     DELTA CHECK NOTED    Phosphorus  Date/Time Value Range Status  07/25/2012  4:43 AM 3.1  2.3 - 4.6 mg/dL Final  45/40/9811  9:14 AM 2.8  2.3 - 4.6 mg/dL Final  78/29/5621  3:08 PM 4.6  2.3 - 4.6 mg/dL Final    Magnesium  Date/Time Value Range Status  07/24/2012  4:25 AM 1.9  1.5 - 2.5 mg/dL Final  65/78/4696  2:95 AM 2.3  1.5 - 2.5 mg/dL Final  28/41/3244  0:10 PM 4.2* 1.5 - 2.5 mg/dL Final    CBG (last 3)   Basename 07/25/12 0344 07/25/12 0004 07/24/12 1958  GLUCAP 92 105* 97      Intake/Output  Summary (Last 24 hours) at 07/25/12 1041 Last data filed at 07/25/12 0900  Gross per 24 hour  Intake 2439.5 ml  Output   1418 ml  Net 1021.5 ml    Weight Status:  83.9 kg up from 73.5 kg on 10/21 with positive fluid status  Re-estimated needs:  2075 kcals, 110-125 gm protein, 2-2.2 liters fluid daily  Nutrition Dx:  Inadequate oral intake r/t inability to eat AEB NPO status.  Goal:  Intake to meet >90% of estimated nutrition needs.  Monitor:  TF tolerance/adequacy, weight trend, labs, I/O   Joaquin Courts, RD, LDN, CNSC Pager# [phone] After Hours Pager# [phone]

## 2012-07-25 NOTE — Progress Notes (Signed)
ANTIBIOTIC CONSULT NOTE - FOLLOW UP  Pharmacy Consult for vancomycin and Zosyn Indication: pneumonia  No Known Allergies  Patient Measurements: Height: 5\' 7"  (170.2 cm) Weight: 184 lb 15.5 oz (83.9 kg) IBW/kg (Calculated) : 66.1  Adjusted Body Weight: 73.2 kg  Vital Signs: Temp: 101.4 F (38.6 C) (10/23 0800) BP: 105/46 mmHg (10/23 0900) Pulse Rate: 110  (10/23 0900) Intake/Output from previous day: 10/22 0701 - 10/23 0700 In: 2463 [I.V.:1985.5; NG/GT:80; IV Piggyback:397.5] Out: 1664 [Urine:15; Emesis/NG output:100; Drains:500; Blood:50] Intake/Output from this shift: Total I/O In: 172 [I.V.:132; NG/GT:30; IV Piggyback:10] Out: 0   Labs:  [facility] 07/25/12 0443 07/24/12 0425 07/23/12 1555 07/23/12 0716 07/23/12 0500  WBC 10.0 17.4* -- -- 27.8*  HGB 8.1* 10.7* -- -- 13.6  PLT 86* 135* -- -- 179  LABCREA -- -- -- 59.20 --  CREATININE 5.11*5.10* 3.15* 2.72* -- --   Estimated Creatinine Clearance: 23.3 ml/min (by C-G formula based on Cr of 5.11).  Microbiology: Recent Results (from the past 720 hour(s))  URINE CULTURE     Status: Normal   Collection Time   07/22/12  4:32 PM      Component Value Range Status Comment   Specimen Description URINE, CATHETERIZED   Final    Special Requests NONE   Final    Culture  Setup Time 07/23/2012 11:13   Final    Colony Count NO GROWTH   Final    Culture NO GROWTH   Final    Report Status 07/24/2012 FINAL   Final   CULTURE, BLOOD (ROUTINE X 2)     Status: Normal (Preliminary result)   Collection Time   07/22/12  6:42 PM      Component Value Range Status Comment   Specimen Description BLOOD LEFT ARM   Final    Special Requests BOTTLES DRAWN AEROBIC ONLY   Final    Culture  Setup Time 07/23/2012 10:16   Final    Culture     Final    Value:        BLOOD CULTURE RECEIVED NO GROWTH TO DATE CULTURE WILL BE HELD FOR 5 DAYS BEFORE ISSUING A FINAL NEGATIVE REPORT   Report Status PENDING   Incomplete   CULTURE, BLOOD (ROUTINE X 2)      Status: Normal (Preliminary result)   Collection Time   07/22/12  6:51 PM      Component Value Range Status Comment   Specimen Description BLOOD RIGHT ARM   Final    Special Requests BOTTLES DRAWN AEROBIC ONLY   Final    Culture  Setup Time 07/23/2012 10:16   Final    Culture     Final    Value:        BLOOD CULTURE RECEIVED NO GROWTH TO DATE CULTURE WILL BE HELD FOR 5 DAYS BEFORE ISSUING A FINAL NEGATIVE REPORT   Report Status PENDING   Incomplete   MRSA PCR SCREENING     Status: Normal   Collection Time   07/23/12 12:30 AM      Component Value Range Status Comment   MRSA by PCR NEGATIVE  NEGATIVE Final     Anti-infectives     Start     Dose/Rate Route Frequency Ordered Stop   07/24/12 2200  piperacillin-tazobactam (ZOSYN) IVPB 3.375 g       3.375 g 12.5 mL/hr over 240 Minutes Intravenous 3 times per day 07/24/12 1109     07/24/12 1600   vancomycin (VANCOCIN) IVPB 1000  mg/200 mL premix        1,000 mg 200 mL/hr over 60 Minutes Intravenous  Once 07/24/12 1058 07/24/12 1719   07/24/12 1400  piperacillin-tazobactam (ZOSYN) IVPB 3.375 g       3.375 g 100 mL/hr over 30 Minutes Intravenous  Once 07/24/12 1109 07/24/12 1434   07/23/12 1400   piperacillin-tazobactam (ZOSYN) IVPB 3.375 g  Status:  Discontinued        3.375 g 100 mL/hr over 30 Minutes Intravenous Every 6 hours 07/23/12 1440 07/24/12 1109   07/23/12 1300   piperacillin-tazobactam (ZOSYN) IVPB 3.375 g  Status:  Discontinued        3.375 g 12.5 mL/hr over 240 Minutes Intravenous 4 times per day 07/23/12 1216 07/23/12 1439   07/22/12 2200   piperacillin-tazobactam (ZOSYN) IVPB 3.375 g  Status:  Discontinued        3.375 g 12.5 mL/hr over 240 Minutes Intravenous 3 times per day 07/22/12 2156 07/23/12 1216   07/22/12 1930   vancomycin (VANCOCIN) IVPB 1000 mg/200 mL premix  Status:  Discontinued        1,000 mg 200 mL/hr over 60 Minutes Intravenous Every 24 hours 07/22/12 1831 07/24/12 1058   07/22/12 1815    Ampicillin-Sulbactam (UNASYN) 3 g in sodium chloride 0.9 % 100 mL IVPB  Status:  Discontinued        3 g 100 mL/hr over 60 Minutes Intravenous  Once 07/22/12 1806 07/22/12 2155          Assessment: 21 YOM admitted with heroin overdose, also had EtOH in system on admission. Suspected aspiration pneumonia. Vancomycin and Zosyn have been started to cover empirically. Patient was on CVVHD which was stopped prior to him going to surgery for an I&D of his upper right arm due to compartment syndrome. Wound vac has been placed. While on CVVHD was receiving vancomycin 1000mg  IV Q24 hr and Zosyn 3.375 Q6hr with an infusion time of 30 minutes. Prior to going to surgery on 10/22, he received one more dose of vancomycin 1000mg  at ~1400 and his Zosyn was changed to 3.375gm IV Q8hr with extended interval dosing to reflect his renal function off CVVHD. There are no plans for him to receive dialysis per Dr. Caryn Section today since he is still having fevers and cultures have been sent. Today his SCr is 5.1 with an estimated CrCl of 59mL/min and oliguria.  Goal of Therapy:  Vancomycin trough level 15-20 mcg/ml  Plan:  1. Will not repeat vancomycin dosing today since the patient is making very little urine and he is not undergoing any type of dialysis 2. If hemodialysis is started, would require vancomycin 1000mg  IV post-dialysis 3. Continue Zosyn 3.375gm IV Q8hr extended interval 4. Will follow renal function, cultures and sensitivities, and clinical progression and make changes as needed 5. Vancomycin trough at steady state if clinically indicated  Keatyn Jawad D. Giovanne Nickolson, PharmD Clinical Pharmacist Pager: [phone] Phone: [phone] 07/25/2012 12:24 PM

## 2012-07-25 NOTE — Progress Notes (Signed)
Name: Martin Chapman MRN: [id number] DOB: October 16, 1990    LOS: 3  PULMONARY / CRITICAL CARE MEDICINE  HPI:  21 years old male with PMH relevant for heroin addiction, depression, anxiety and panic attacks. He overdosed on heroin and alcohol 10/19 PM  And 10/20 PM he was found unresponsive at about 2:00 pm. Pt now on vent, s/p compartment syndrome and release in OR of R shoulder muscles.   Events since admission: 10/20- Admitted to ICU 10/20 - Surgery with shoulder release and fasciotomy of right arm 10/21 - Started on CVVH>>d/c 10/22 10/22 - Return to OR for I&D with wound vac placement. Biceps excised   Current Status: Remains critically ill and requires vent. Febrile to 101.3  Vital Signs: Temp:  [96.4 F (35.8 C)-101.3 F (38.5 C)] 101.3 F (38.5 C) (10/23 0740) Pulse Rate:  [77-108] 104  (10/23 0740) Resp:  [8-21] 14  (10/23 0740) BP: (91-113)/(33-57) 94/53 mmHg (10/23 0740) SpO2:  [100 %] 100 % (10/23 0740) Arterial Line BP: (90-124)/(47-61) 102/52 mmHg (10/23 0600) FiO2 (%):  [40 %] 40 % (10/23 0305) Weight:  [184 lb 15.5 oz (83.9 kg)] 184 lb 15.5 oz (83.9 kg) (10/23 0455)  Physical Examination: General: sedated on vent, no acute distress  Neuro: sedated on vent. Opens eyes, moves all extremities but does not follow commands HEENT:  PERRL, pink conjunctivae, dry mucous membranes, ETT in place Neck:  Supple, no JVD   Cardiovascular:  RRR, no M/R/G Lungs:  CTA Abdomen:  Soft, nontender, nondistended, bowel sounds present Musculoskeletal: Dressing on R shoulder/ Right arm with bloody drainage. Right hand warm and soft Skin:  No rash  Dg Chest Port 1 View  07/25/2012  *RADIOLOGY REPORT*  Clinical Data: Intubated patient  PORTABLE CHEST - 1 VIEW  Comparison: Chest radiograph 07/24/2012  Findings: Endotracheal tube is 2.2 cm from carina.  Left-sided central venous line is unchanged in position.  Stable cardiac silhouette.  Lungs are clear.  No pneumothorax.   IMPRESSION: Endotracheal tube 2.2 cm from carina.   Original Report Authenticated By: Genevive Bi, M.D.    Dg Chest Port 1 View  07/24/2012  *RADIOLOGY REPORT*  Clinical Data: Evaluate endotracheal tube position  PORTABLE CHEST - 1 VIEW  Comparison: Chest radiograph 07/23/2012 at 1:08 hours  Findings: Current radiograph is at 5:47 hours.  The patient is markedly rotated to the right on the first image.  A second image was performed, without significant rotation. The endotracheal tube is approximately 4 cm above the carina, and at the level of the clavicular heads.  Left IJ central venous catheter terminates in the distal superior vena cava.  Nasogastric tube courses into the stomach and continues below the edge of the image.  Lung volumes are low.  Heart size is normal. There are bilateral hazy opacities at the lung bases and costophrenic angles, consistent with small bilateral pleural effusions. Aeration of the right upper lung field is improved compared to the chest radiograph performed 07/23/2012.  IMPRESSION: Satisfactory position of endotracheal tube.  Small bilateral pleural effusions and low lung volumes. Improved aeration of the right upper lung field compared to the 07/23/2012 chest radiograph.   Original Report Authenticated By: Britta Mccreedy, M.D.      Active problems: 1) Drug overdose (heroin, alcohol) 2) Rhabdomyolysis likely secondary to right arm injury 3) Right upper extremity compartment syndrome. Possible vascular injury 4) Acute renal failure secondary to volume depletion and rhabdomyolysis 5) Hyperkalemia 6) Elevated LFT's and INR, likely shock liver 7) Shock,  likely hypovolemic, possibly septic. 8) Hypoxemia likely secondary to aspiration pneumonia / chemical pneumonitis  ASSESSMENT AND PLAN  PULMONARY  Lab 07/23/12 0049 07/22/12 2337 07/22/12 2240  PHART 7.266* 7.229* 7.179*  PCO2ART 36.1 44.2 47.5*  PO2ART 85.3 59.0* 72.0*  HCO3 15.9* 18.9* 17.7*  O2SAT 96.8 88.0  90.0   Ventilator Settings: Vent Mode:  [-] PRVC FiO2 (%):  [40 %] 40 % Set Rate:  [14 bmp-20 bmp] 14 bmp Vt Set:  [530 mL] 530 mL PEEP:  [5 cmH20] 5 cmH20 Plateau Pressure:  [20 cmH20-22 cmH20] 20 cmH20  CXR: 10/23 No acute infiltrates, ETT in place 2.3 cm above carina  A:   1) Hypoxemia likely secondary to aspiration pneumonia / pneumonitis P:   -Cont full vent support -WUA/SBT daily -PCXR in AM   CARDIOVASCULAR  Lab 07/24/12 0425 07/23/12 1550 07/23/12 0800 07/23/12 0050 07/22/12 2020 07/22/12 1920 07/22/12 1638  TROPONINI 1.91* 3.24* 1.78* 1.07* 0.62* -- --  LATICACIDVEN 2.4* -- -- -- -- 8.2* 10.7*  PROBNP -- -- -- -- -- -- --   ECG:  NSR Lines: Left IJ placed 07/22/12 >> R femoral HD cath 10/20>> Art line L radial 10/21>>  A:  1) Hypovolemic/septic shock. 2) Lactic acidosis likely secondary to shock and rhabdomyolysis>>improved 3) Right upper extremity injury with compartment syndrome and possible vascular injury #1 and 2 improved AM 10/21, off vasopressors Note arch aortogram in OR normal.  No vascular deficit in RUE/shoulder P:  - Orthopedic surgery consult appreciated - CK remains >50k likely secondary to rhabdo - Troponin elevated, but now trending down - Echo completed, awaiting result  RENAL  Lab 07/25/12 0443 07/24/12 0425 07/23/12 1555 07/23/12 0500 07/23/12 0156 07/22/12 2021  NA 140141 138 142 143 141 --  K 3.63.6 2.9* -- -- -- --  CL 9899 92* 100 108 109 --  CO2 3129 35* 32 25 24 --  BUN 36*36* 27* 27* 26* 24* --  CREATININE 5.11*5.10* 3.15* 2.72* 2.34* 2.22* --  CALCIUM 7.6*7.6* 7.0* 7.1* 7.0* 6.8* --  MG -- 1.9 -- -- 2.3 4.2*  PHOS 3.1 2.8 4.6 5.4* 6.6* --   Intake/Output      10/22 0701 - 10/23 0700 10/23 0701 - 10/24 0700   I.V. (mL/kg) 1985.5 (23.7)    NG/GT 80    IV Piggyback 397.5    Total Intake(mL/kg) 2463 (29.4)    Urine (mL/kg/hr) 15 (0)    Emesis/NG output 100    Drains 500    Other 999    Blood 50    Total Output  1664    Net +799          Foley:  07/22/12  A:   1) Acute renal failure s/p CRRT 2) Rhabdomyolysis 3) Hyperkalemia>>>better. Now Hypokalemic P:   - HD cath placed in OR by Vascular Surgery. Required CRRT but now off with plan for intermittent HD - K+ improved - Plans for HD per renal   GASTROINTESTINAL  Lab 07/25/12 0443 07/24/12 0425 07/23/12 1555 07/23/12 0500 07/23/12 0156 07/22/12 2021  AST 968* 1112* -- 1133* 1056* 627*  ALT 290* 389* -- 454* 444* 468*  ALKPHOS 70 71 -- 65 63 226*  BILITOT 1.4* 2.4* -- 2.0* 1.3* 0.8  PROT 4.5* 4.4* -- 4.5* 4.1* 6.5  ALBUMIN 2.3*2.3* 2.4*2.3* 2.7* 2.7*2.6* 2.6* --    A:   1) Elevated LFT's and INR likely shock liver P:   - LFT improving - Will continue to monitor; repeat chem in  AM - GI prophylaxis with Protonix - Start tube feeds   HEMATOLOGIC  Lab 07/25/12 0443 07/24/12 0425 07/23/12 0500 07/23/12 0050 07/22/12 2337 07/22/12 2240 07/22/12 2021  HGB 8.1* 10.7* 13.6 -- 9.9* 12.9* --  HCT 24.1* 30.6* 38.4* -- 29.0* 38.0* --  PLT 86* 135* 179 124* -- -- 232  INR -- 1.65* 1.64* 2.39* -- -- 2.01*  APTT -- 32 -- 43* -- -- 46*   A:   1) Elevated INR likely from shock liver P:  - HgB trending down. Pt with bleeding from arm - Repeat labs PM and AM. Transfuse <7 - Thrombocytopenia; cont to monitor - SQ heparin for DVT proph   INFECTIOUS  Lab 07/25/12 0443 07/24/12 0425 07/23/12 1200 07/23/12 0500 07/22/12 2023 07/22/12 2021 07/22/12 1638  WBC 10.0 17.4* -- 27.8* -- 32.6* 37.8*  PROCALCITON 20.19 29.45 31.40 -- 6.62 -- --   Cultures: Blood culture 10/20>> NGTD Urine culture 10/20>> No growth  Antibiotics: Zosyn and vancomycin 10/20  A:   1) Aspiration pneumonia. 2) Rt arm compartment syndrome P:   - Currently febrile on antibiotics. OR yesterday evening - Continue antibiotics as above - WBC improving, PCT trending down - s/p fasciotomy, I&D and biceps excision for necrosis after RUE injury/compartment  syndrome - Blood and urine cultures ordered again   ENDOCRINE  Lab 07/25/12 0344 07/25/12 0004 07/24/12 1958 07/24/12 1541 07/24/12 1243  GLUCAP 92 105* 97 88 91   A:   1) No history of diabetes . CBG now stable P:   - POCT glucose q4hrs - Continue IVF to D5NS at 50/hr   NEUROLOGIC  A:   1) Acute encephalopathy from OD; sedated on vent  P:   -sedation protocol   BEST PRACTICE / DISPOSITION - Level of Care:  ICU - Primary Service:  PCCM - Consultants:  Vascular surgery, orthopedic surgery, nephrology - Code Status:  Full code - Diet:  NPO - DVT Px:  SCD, Hep SQ - GI Px:  Protonix - Skin Integrity:  Intact - Social / Family:  Father updated 10/23  Amber M. Hairford, M.D. 07/25/2012 7:41 AM   Will continue current abx.  Will need to monitor whether he has return of renal fx, but plan for intermittent HD for now.  Post-op care per ortho.  Will start tube feeds since he will likely be on vent for several more days.  Critical care time 35 minutes.  Coralyn Helling, MD [facility] Pulmonary/Critical Care 07/25/2012, 2:03 PM Pager:  [phone] After 3pm call: [phone]

## 2012-07-25 NOTE — Progress Notes (Signed)
07/25/12 1300  Clinical Encounter Type  Visited With Patient  Visit Type Follow-up    Stopped by to see patient. He was still not conscious.  Chaplain Vonzella Nipple

## 2012-07-25 NOTE — Progress Notes (Signed)
Patient ID: Martin Chapman, male   DOB: 12-08-1990, 21 y.o.   MRN: [id number] Postoperative day 1 repeat irrigation and debridement right arm. Patient had complete nonviable necrotic biceps muscle. This was excised. The brachialis has questionable viability and had limited contractility this muscle was left intact. The triceps muscle had good viability. Wound VAC functioning well with good drainage today. Plan to change the wound VAC on Friday.

## 2012-07-25 NOTE — Progress Notes (Signed)
Martin Chapman  Subjective:  Intubated, sedated,ebridement of R upper arm last night (Dr. Lajoyce Corners.  Off pressors. T 101.1   Objective: Vital signs in last 24 hours: Blood pressure 97/34, pulse 102, temperature 101 F (38.3 C), temperature source Core (Comment), resp. rate 14, height 5\' 7"  (1.702 m), weight 83.9 kg (184 lb 15.5 oz), SpO2 100.00%.    PHYSICAL EXAM General--as above Chest--clear Heart--no rub Abd--nontender, foley cath in place Extr--R upper arm post op, trace pretib  edema  Lab Results:   Lab 07/25/12 0443 07/24/12 0425 07/23/12 1555  NA 140141 138 142  K 3.63.6 2.9* 3.6  CL 9899 92* 100  CO2 3129 35* 32  BUN 36*36* 27* 27*  CREATININE 5.11*5.10* 3.15* 2.72*  ALB -- -- --  GLUCOSE 8787 -- --  CALCIUM 7.6*7.6* 7.0* 7.1*  PHOS 3.1 2.8 4.6     Basename 07/25/12 0443 07/24/12 0425  WBC 10.0 17.4*  HGB 8.1* 10.7*  HCT 24.1* 30.6*  PLT 86* 135*   I have reviewed the patient's current medications.  Assessment/Plan: 1. ARF--off CVVH. BP better--off pressors. CXR clear.  Will hold dialysis today 2. Metabolic acidosis--no ABG . Serum bicarb 30 today.  3. Hyperkalemia-- K OK now. See Above  4. RUE compartment syndrome--Dr. Lajoyce Corners following 5. Polysubstance abuse (heroin and alcohol)--mgt after recovery from acute issues 6.  Fever--T 101.1--blood cultures and urine culture ordered--already on zosyn + vanco     LOS: 3 days   Martin Chapman F 07/25/2012,7:35 AM   .labalb

## 2012-07-26 ENCOUNTER — Inpatient Hospital Stay (HOSPITAL_COMMUNITY): Payer: BC Managed Care – PPO

## 2012-07-26 LAB — COMPREHENSIVE METABOLIC PANEL
BUN: 54 mg/dL — ABNORMAL HIGH (ref 6–23)
CO2: 31 mEq/L (ref 19–32)
Calcium: 7.7 mg/dL — ABNORMAL LOW (ref 8.4–10.5)
Creatinine, Ser: 8.95 mg/dL — ABNORMAL HIGH (ref 0.50–1.35)
GFR calc Af Amer: 9 mL/min — ABNORMAL LOW (ref >90)
GFR calc non Af Amer: 7 mL/min — ABNORMAL LOW (ref >90)
Glucose, Bld: 114 mg/dL — ABNORMAL HIGH (ref 70–99)
Sodium: 142 mEq/L (ref 135–145)
Total Protein: 4.5 g/dL — ABNORMAL LOW (ref 6.0–8.3)

## 2012-07-26 LAB — GLUCOSE, CAPILLARY
Glucose-Capillary: 121 mg/dL — ABNORMAL HIGH (ref 70–99)
Glucose-Capillary: 122 mg/dL — ABNORMAL HIGH (ref 70–99)
Glucose-Capillary: 125 mg/dL — ABNORMAL HIGH (ref 70–99)
Glucose-Capillary: 149 mg/dL — ABNORMAL HIGH (ref 70–99)

## 2012-07-26 LAB — VANCOMYCIN, RANDOM: Vancomycin Rm: 32.1 ug/mL

## 2012-07-26 LAB — URINE CULTURE: Colony Count: NO GROWTH

## 2012-07-26 LAB — PHOSPHORUS: Phosphorus: 3.4 mg/dL (ref 2.3–4.6)

## 2012-07-26 LAB — CK: Total CK: 17707 U/L — ABNORMAL HIGH (ref 7–232)

## 2012-07-26 LAB — CBC
HCT: 21.5 % — ABNORMAL LOW (ref 39.0–52.0)
Hemoglobin: 7.3 g/dL — ABNORMAL LOW (ref 13.0–17.0)
MCHC: 34 g/dL (ref 30.0–36.0)
RBC: 2.52 MIL/uL — ABNORMAL LOW (ref 4.22–5.81)

## 2012-07-26 LAB — MAGNESIUM: Magnesium: 2.7 mg/dL — ABNORMAL HIGH (ref 1.5–2.5)

## 2012-07-26 MED ORDER — LIDOCAINE-PRILOCAINE 2.5-2.5 % EX CREA: 1.0000 "application " | TOPICAL_CREAM | CUTANEOUS | Status: DC | PRN

## 2012-07-26 MED ORDER — HEPARIN SODIUM (PORCINE) 1000 UNIT/ML DIALYSIS
1000.0000 [IU] | INTRAMUSCULAR | Status: DC | PRN
  Administered 2012-07-30 (×2): 1000 [IU] via INTRAVENOUS_CENTRAL
  Filled 2012-07-26: qty 1

## 2012-07-26 MED ORDER — LIDOCAINE HCL (PF) 1 % IJ SOLN: 5.0000 mL | INTRAMUSCULAR | Status: DC | PRN

## 2012-07-26 MED ORDER — VANCOMYCIN HCL IN DEXTROSE 1-5 GM/200ML-% IV SOLN
1000.0000 mg | Freq: Once | INTRAVENOUS | Status: AC
  Administered 2012-07-26: 1000 mg via INTRAVENOUS
  Filled 2012-07-26: qty 200

## 2012-07-26 MED ORDER — HEPARIN SODIUM (PORCINE) 1000 UNIT/ML DIALYSIS
20.0000 [IU]/kg | INTRAMUSCULAR | Status: DC | PRN
  Administered 2012-07-26 – 2012-07-28 (×2): 1600 [IU] via INTRAVENOUS_CENTRAL
  Filled 2012-07-26: qty 2

## 2012-07-26 MED ORDER — SODIUM CHLORIDE 0.9 % IV SOLN: 100.0000 mL | INTRAVENOUS | Status: DC | PRN

## 2012-07-26 MED ORDER — PENTAFLUOROPROP-TETRAFLUOROETH EX AERO: 1.0000 "application " | INHALATION_SPRAY | CUTANEOUS | Status: DC | PRN

## 2012-07-26 MED ORDER — PIPERACILLIN-TAZOBACTAM IN DEX 2-0.25 GM/50ML IV SOLN
2.2500 g | Freq: Three times a day (TID) | INTRAVENOUS | Status: DC
  Administered 2012-07-26 – 2012-07-29 (×11): 2.25 g via INTRAVENOUS
  Filled 2012-07-26 (×14): qty 50

## 2012-07-26 MED ORDER — ALTEPLASE 2 MG IJ SOLR: 2.0000 mg | Freq: Once | INTRAMUSCULAR | Status: AC | PRN

## 2012-07-26 MED ORDER — NEPRO/CARBSTEADY PO LIQD
237.0000 mL | ORAL | Status: DC | PRN
  Filled 2012-07-26: qty 237

## 2012-07-26 NOTE — Progress Notes (Signed)
Name: Martin Chapman MRN: [id number] DOB: 09/19/1991    LOS: 4  PULMONARY / CRITICAL CARE MEDICINE  HPI:  21 years old male with PMH relevant for heroin addiction, depression, anxiety and panic attacks. He overdosed on heroin and alcohol 10/19 PM  And 10/20 PM he was found unresponsive at about 2:00 pm. Pt now on vent, s/p compartment syndrome and release in OR of R shoulder muscles.   Events since admission: 10/20- Admitted to ICU 10/20 - Surgery with shoulder release and fasciotomy of right arm 10/21 - Started on CVVH>>d/c 10/22 10/22 - Return to OR for I&D with wound vac placement. Biceps excised   Current Status: Remains critically ill and requires vent. Last recorded fever >8 hours ago.  Vital Signs: Temp:  [98.8 F (37.1 C)-101.9 F (38.8 C)] 99 F (37.2 C) (10/24 0854) Pulse Rate:  [80-108] 98  (10/24 0854) Resp:  [10-18] 15  (10/24 0854) BP: (86-120)/(29-71) 112/51 mmHg (10/24 0854) SpO2:  [100 %] 100 % (10/24 0854) Arterial Line BP: (106)/(52) 106/52 mmHg (10/23 1000) FiO2 (%):  [40 %] 40 % (10/24 0854) Weight:  [180 lb 8.9 oz (81.9 kg)] 180 lb 8.9 oz (81.9 kg) (10/24 0500)  Physical Examination: General: sedated on vent, no acute distress while asleep Neuro: sedated on vent. Opens eyes, moves all extremities but does not follow commands. Some agitation HEENT:  PERRL, pink conjunctivae, ETT in place Neck:  Supple, no JVD   Cardiovascular:  RRR, no M/R/G Lungs:  CTA Abdomen:  Soft, nontender, nondistended, bowel sounds present Musculoskeletal: Dressing on R shoulder/ Right arm.Right hand warm and soft. Good radial pulses Skin:  No rash  Dg Chest Port 1 View  07/26/2012  *RADIOLOGY REPORT*  Clinical Data: Atelectasis.  Shortness of breath.  PORTABLE CHEST - 1 VIEW  Comparison: Chest x-ray 07/25/2012.  Findings: An endotracheal tube is in place with tip 3.2 cm above the carina. There is a left-sided internal jugular central venous catheter with tip  terminating in the superior cavoatrial junction. Lung volumes are low.  Bibasilar opacities (left greater than right), favored to represent areas of subsegmental atelectasis. Blunting of the left costophrenic sulcus favored to reflect a small left pleural effusion.  Crowding of the pulmonary vasculature, accentuated by low lung volumes.  Heart size is within normal limits. The patient is rotated to the right on today's exam, resulting in distortion of the mediastinal contours and reduced diagnostic sensitivity and specificity for mediastinal pathology.  IMPRESSION: 1.  Support apparatus, as above. 2.  Low lung volumes with worsening bibasilar atelectasis and new small left pleural effusion.   Original Report Authenticated By: Florencia Reasons, M.D.    Dg Chest Port 1 View  07/25/2012  *RADIOLOGY REPORT*  Clinical Data: Intubated patient  PORTABLE CHEST - 1 VIEW  Comparison: Chest radiograph 07/24/2012  Findings: Endotracheal tube is 2.2 cm from carina.  Left-sided central venous line is unchanged in position.  Stable cardiac silhouette.  Lungs are clear.  No pneumothorax.  IMPRESSION: Endotracheal tube 2.2 cm from carina.   Original Report Authenticated By: Genevive Bi, M.D.      Active problems: 1) Drug overdose (heroin, alcohol) 2) Rhabdomyolysis likely secondary to right arm injury 3) Right upper extremity compartment syndrome. Possible vascular injury 4) Acute renal failure secondary to volume depletion and rhabdomyolysis 5) Hyperkalemia 6) Elevated LFT's and INR, likely shock liver 7) Shock, likely hypovolemic, possibly septic. 8) Hypoxemia likely secondary to aspiration pneumonia / chemical pneumonitis  ASSESSMENT AND PLAN  PULMONARY  Lab 07/23/12 0049 07/22/12 2337 07/22/12 2240  PHART 7.266* 7.229* 7.179*  PCO2ART 36.1 44.2 47.5*  PO2ART 85.3 59.0* 72.0*  HCO3 15.9* 18.9* 17.7*  O2SAT 96.8 88.0 90.0   Ventilator Settings: Vent Mode:  [-] CPAP FiO2 (%):  [40 %] 40 % Set  Rate:  [14 bmp] 14 bmp Vt Set:  [530 mL] 530 mL PEEP:  [5 cmH20] 5 cmH20 Pressure Support:  [14 cmH20] 14 cmH20 Plateau Pressure:  [17 cmH20-19 cmH20] 18 cmH20  CXR: 10/23 No acute infiltrates, ETT in place 2.3 cm above carina  A:   1) Hypoxemia likely secondary to aspiration pneumonia / pneumonitis P:   -Cont full vent support -WUA/SBT daily -PCXR in AM to eval ett   CARDIOVASCULAR  Lab 07/24/12 0425 07/23/12 1550 07/23/12 0800 07/23/12 0050 07/22/12 2020 07/22/12 1920 07/22/12 1638  TROPONINI 1.91* 3.24* 1.78* 1.07* 0.62* -- --  LATICACIDVEN 2.4* -- -- -- -- 8.2* 10.7*  PROBNP -- -- -- -- -- -- --   ECG:  NSR Lines: Left IJ placed 07/22/12 >> R femoral HD cath 10/20>> Art line L radial 10/21>>10/24  Echo 10/22>>EF 55 to 60%  A:  1) Hypovolemic/septic shock>>off pressors 10/24. 2) Lactic acidosis likely secondary to shock and rhabdomyolysis>>improved 3) Right upper extremity injury with compartment syndrome and possible vascular injury Note arch aortogram in OR normal.  No vascular deficit in RUE/shoulder P:  - Orthopedic surgery consult appreciated - CK 17K today (trending down from 50K) - Troponin was elevated, but now trending down  RENAL  Lab 07/26/12 0743 07/25/12 0443 07/24/12 0425 07/23/12 1555 07/23/12 0500 07/23/12 0156 07/22/12 2021  NA 142 140141 138 142 143 -- --  K 3.2* 3.63.6 -- -- -- -- --  CL 100 9899 92* 100 108 -- --  CO2 31 3129 35* 32 25 -- --  BUN 54* 36*36* 27* 27* 26* -- --  CREATININE 8.95* 5.11*5.10* 3.15* 2.72* 2.34* -- --  CALCIUM 7.7* 7.6*7.6* 7.0* 7.1* 7.0* -- --  MG 2.7* -- 1.9 -- -- 2.3 4.2*  PHOS 3.4 3.1 2.8 4.6 5.4* -- --   Intake/Output      10/23 0701 - 10/24 0700 10/24 0701 - 10/25 0700   I.V. (mL/kg) 1307 (16)    NG/GT 1045    IV Piggyback 85    Total Intake(mL/kg) 2437 (29.8)    Urine (mL/kg/hr) 10 (0)    Emesis/NG output     Drains 400    Other     Blood     Total Output 410    Net +2027          Foley:   07/22/12  A:   1) Acute renal failure s/p CRRT 2) Rhabdomyolysis 3) Hyperkalemia>>>better. Now Hypokalemic P:   - HD cath placed in OR by Vascular Surgery. Required CRRT but now off with plan for intermittent HD - Plans for HD per renal   GASTROINTESTINAL  Lab 07/26/12 0743 07/25/12 0443 07/24/12 0425 07/23/12 1555 07/23/12 0500 07/23/12 0156  AST 690* 968* 1112* -- 1133* 1056*  ALT 211* 290* 389* -- 454* 444*  ALKPHOS 85 70 71 -- 65 63  BILITOT 0.7 1.4* 2.4* -- 2.0* 1.3*  PROT 4.5* 4.5* 4.4* -- 4.5* 4.1*  ALBUMIN 2.1* 2.3*2.3* 2.4*2.3* 2.7* 2.7*2.6* --    A:   1) Elevated LFT's and INR likely shock liver P:   - LFT improving - Will continue to monitor; repeat CMet in AM - GI prophylaxis with Protonix -  On TF   HEMATOLOGIC  Lab 07/26/12 0430 07/25/12 1600 07/25/12 0443 07/24/12 0425 07/23/12 0500 07/23/12 0050 07/22/12 2021  HGB 7.3* 7.5* 8.1* 10.7* 13.6 -- --  HCT 21.5* 22.1* 24.1* 30.6* 38.4* -- --  PLT 77* 77* 86* 135* 179 -- --  INR -- -- -- 1.65* 1.64* 2.39* 2.01*  APTT -- -- -- 32 -- 43* 46*   A:   1) Elevated INR likely from shock liver P:  - HgB and Plt stable - Transfuse <7 - Thrombocytopenia; cont to monitor - SQ heparin for DVT ppx   INFECTIOUS  Lab 07/26/12 0430 07/25/12 1600 07/25/12 0443 07/24/12 0425 07/23/12 1200 07/23/12 0500 07/22/12 2023  WBC 7.5 9.1 10.0 17.4* -- 27.8* --  PROCALCITON -- -- 20.19 29.45 31.40 -- 6.62   Cultures: Blood culture 10/20>> NGTD Urine culture 10/20>> No growth  Antibiotics: Zosyn and vancomycin 10/20  A:   1) Aspiration pneumonia. 2) Rt arm compartment syndrome P:   - Currently febrile on antibiotics>>fever curving improving - Continue antibiotics as above - WBC improving, PCT trending down - s/p fasciotomy, I&D and biceps excision for necrosis after RUE injury/compartment syndrome - Blood and urine cultures ordered again, results pending   ENDOCRINE  Lab 07/26/12 0814 07/26/12 0339 07/26/12  0022 07/25/12 1945 07/25/12 1602  GLUCAP 121* 122* 125* 139* 96   A:   1) No history of diabetes . CBG now stable P:   - POCT glucose q4hrs - Continue IVF to D5NS at 50/hr   NEUROLOGIC  A:   1) Acute encephalopathy from OD; sedated on vent  P:   -sedation protocol, minimize as tolerated   BEST PRACTICE / DISPOSITION - Level of Care:  ICU - Primary Service:  PCCM - Consultants:  Vascular surgery (signed off), orthopedic surgery, nephrology - Code Status:  Full code - Diet:  NPO - DVT Px:  SCD, Hep SQ - GI Px:  Protonix - Skin Integrity:  Intact - Social / Family:  Father updated 10/23  Amber M. Hairford, M.D. 07/26/2012 9:01 AM   Critical care time 35 minutes.  Sepsis improving, and respiratory mechanics improving.  May be ready for extubation soon.  Concern that he may need long term HD.  Coralyn Helling, MD [facility] Pulmonary/Critical Care 07/26/2012, 2:06 PM Pager:  [phone] After 3pm call: [phone]

## 2012-07-26 NOTE — Progress Notes (Signed)
[facility]  Subjective:  Opens eyes, tries to sit up.  Only on fentanyl.  Off pressors.  CVVH d/c'd 22 Oct.  Only 10 cc urine out yesterday    Objective: Vital signs in last 24 hours: Blood pressure 100/47, pulse 80, temperature 98.9 F (37.2 C), temperature source Core (Comment), resp. rate 14, height 5\' 7"  (1.702 m), weight 81.9 kg (180 lb 8.9 oz), SpO2 100.00%.    PHYSICAL EXAM  General--as above  Chest--clear  Heart--no rub  Abd--nontender, foley cath in place  Extr--R upper arm post op, trace pretib edema, HD cath in R fem   Lab Results:   Lab 07/26/12 0743 07/25/12 0443 07/24/12 0425  NA 142 140141 138  K 3.2* 3.63.6 2.9*  CL 100 9899 92*  CO2 31 3129 35*  BUN 54* 36*36* 27*  CREATININE 8.95* 5.11*5.10* 3.15*  ALB -- -- --  GLUCOSE 114* -- --  CALCIUM 7.7* 7.6*7.6* 7.0*  PHOS 3.4 3.1 2.8     Basename 07/26/12 0430 07/25/12 1600  WBC 7.5 9.1  HGB 7.3* 7.5*  HCT 21.5* 22.1*  PLT 77* 77*     I have reviewed the patient's current medications.  Assessment/Plan: 1. ARF--off CVVH. BP better--off pressors. CXR clear. Will  dialyze today  2. Metabolic acidosis--no ABG . Serum bicarb 30 yesterday.  3. Hyperkalemia-- K OK yesterday. See Above  4. RUE compartment syndrome--Dr. Lajoyce Corners following  5. Polysubstance abuse (heroin and alcohol)--mgt after recovery from acute issues  6. Fever--T 101.1 yesterday--98.9 today--blood cultures and urine culture ordered-- on zosyn + vanco     LOS: 4 days   Saxon Crosby F 07/26/2012,8:43 AM   .labalb

## 2012-07-26 NOTE — Progress Notes (Signed)
Pharmacy Consult - Vancomycin  Vancomycin level 4 hours after HD = 32.1 mcg/dL  Plan: 1) No further vancomycin doses needed for tonight. 2) Continue to follow.  Thank you. Okey Regal, PharmD

## 2012-07-26 NOTE — Progress Notes (Signed)
07/25/12 2300  Clinical Encounter Type  Visited With Family  Visit Type Follow-up  Advance Directives (For Healthcare)  Advance Directive Patient does not have advance directive  Pre-existing out of facility DNR order (yellow form or pink MOST form) No   I spoke to patient's father and grandmother. Father said Carsen is doing a little better.  I will follow up again. Chaplain Vonzella Nipple

## 2012-07-26 NOTE — Progress Notes (Signed)
[facility]  Subjective:  Intubated, sedated (only with fentanyl), opens eyes and tries to sit up.  10 cc urine out yesterday.     Objective: Vital signs in last 24 hours: Blood pressure 100/47, pulse 80, temperature 98.9 F (37.2 C), temperature source Core (Comment), resp. rate 14, height 5\' 7"  (1.702 m), weight 81.9 kg (180 lb 8.9 oz), SpO2 100.00%.    PHYSICAL EXAM General--as above Chest--rhonchi Heart--no rub Abd--nontender, foley cath in Extr--R upper arm post op, trace pretib edema          HD cath R fem   Lab Results:   Lab 07/25/12 0443 07/24/12 0425 07/23/12 1555  NA 140141 138 142  K 3.63.6 2.9* 3.6  CL 9899 92* 100  CO2 3129 35* 32  BUN 36*36* 27* 27*  CREATININE 5.11*5.10* 3.15* 2.72*  ALB -- -- --  GLUCOSE 8787 -- --  CALCIUM 7.6*7.6* 7.0* 7.1*  PHOS 3.1 2.8 4.6     Basename 07/26/12 0430 07/25/12 1600  WBC 7.5 9.1  HGB 7.3* 7.5*  HCT 21.5* 22.1*  PLT 77* 77*     I have reviewed the patient's current medications.  Assessment/Plan: 1. ARF--off CVVH. BP better--off pressors. CXR clear. Will dialyze today  2. Metabolic acidosis--no ABG . Serum bicarb 30 yesterday.  3. Hyperkalemia--last  K OK--today's pending . See Above  4. RUE compartment syndrome--Dr. Lajoyce Corners following  5. Polysubstance abuse (heroin and alcohol)--mgt after recovery from acute issues  6. Fever--T 101.1 yesterday, 98.9 today----blood cultures and urine culture ordered-- on zosyn + vanco     LOS: 4 days   Briant Angelillo F 07/26/2012,8:27 AM   .labalb

## 2012-07-27 ENCOUNTER — Inpatient Hospital Stay (HOSPITAL_COMMUNITY): Payer: BC Managed Care – PPO

## 2012-07-27 LAB — COMPREHENSIVE METABOLIC PANEL
ALT: 200 U/L — ABNORMAL HIGH (ref 0–53)
BUN: 52 mg/dL — ABNORMAL HIGH (ref 6–23)
CO2: 30 mEq/L (ref 19–32)
Calcium: 8 mg/dL — ABNORMAL LOW (ref 8.4–10.5)
Creatinine, Ser: 9.57 mg/dL — ABNORMAL HIGH (ref 0.50–1.35)
GFR calc Af Amer: 8 mL/min — ABNORMAL LOW (ref >90)
GFR calc non Af Amer: 7 mL/min — ABNORMAL LOW (ref >90)
Glucose, Bld: 126 mg/dL — ABNORMAL HIGH (ref 70–99)

## 2012-07-27 LAB — IRON AND TIBC: Saturation Ratios: 20 % (ref 20–55)

## 2012-07-27 LAB — CBC
Hemoglobin: 7.8 g/dL — ABNORMAL LOW (ref 13.0–17.0)
MCH: 28.7 pg (ref 26.0–34.0)
MCV: 83.8 fL (ref 78.0–100.0)
RBC: 2.72 MIL/uL — ABNORMAL LOW (ref 4.22–5.81)

## 2012-07-27 LAB — GLUCOSE, CAPILLARY
Glucose-Capillary: 123 mg/dL — ABNORMAL HIGH (ref 70–99)
Glucose-Capillary: 120 mg/dL — ABNORMAL HIGH (ref 70–99)
Glucose-Capillary: 117 mg/dL — ABNORMAL HIGH (ref 70–99)
Glucose-Capillary: 150 mg/dL — ABNORMAL HIGH (ref 70–99)

## 2012-07-27 LAB — FERRITIN: Ferritin: 332 ng/mL — ABNORMAL HIGH (ref 22–322)

## 2012-07-27 LAB — PHOSPHORUS: Phosphorus: 3.4 mg/dL (ref 2.3–4.6)

## 2012-07-27 MED ORDER — SODIUM CHLORIDE 0.9 % IV SOLN
500.0000 mg | Freq: Once | INTRAVENOUS | Status: DC
  Filled 2012-07-27: qty 500

## 2012-07-27 MED ORDER — DARBEPOETIN ALFA-POLYSORBATE 150 MCG/0.3ML IJ SOLN
150.0000 ug | INTRAMUSCULAR | Status: DC
  Administered 2012-07-28: 150 ug via INTRAVENOUS
  Filled 2012-07-27 (×4): qty 0.3

## 2012-07-27 MED ORDER — HEPARIN SODIUM (PORCINE) 1000 UNIT/ML DIALYSIS
20.0000 [IU]/kg | INTRAMUSCULAR | Status: DC | PRN
  Filled 2012-07-27: qty 2

## 2012-07-27 MED ORDER — SODIUM CHLORIDE 0.9 % IV SOLN
125.0000 mg | INTRAVENOUS | Status: DC
  Administered 2012-07-28 – 2012-08-03 (×3): 125 mg via INTRAVENOUS
  Filled 2012-07-27 (×7): qty 10

## 2012-07-27 NOTE — Progress Notes (Signed)
ANTIBIOTIC CONSULT NOTE - FOLLOW UP  Pharmacy Consult for vancomycin and Zosyn Indication: pneumonia  No Known Allergies  Patient Measurements: Height: 5\' 7"  (170.2 cm) Weight: 176 lb 9.4 oz (80.1 kg) IBW/kg (Calculated) : 66.1  Adjusted Body Weight: 71.7 kg  Vital Signs: Temp: 97.8 F (36.6 C) (10/25 0749) Temp src: Oral (10/25 0749) BP: 122/66 mmHg (10/25 0900) Pulse Rate: 109  (10/25 0900) Intake/Output from previous day: 10/24 0701 - 10/25 0700 In: 3259.1 [I.V.:1724.1; NG/GT:1410; IV Piggyback:125] Out: 3560 [Urine:10; Drains:550]  Labs:  [facility] 07/27/12 0450 07/26/12 0743 07/26/12 0430 07/25/12 1600 07/25/12 0443  WBC 7.5 -- 7.5 9.1 --  HGB 7.8* -- 7.3* 7.5* --  PLT 120* -- 77* 77* --  LABCREA -- -- -- -- --  CREATININE 9.57* 8.95* -- -- 5.11*5.10*   Estimated Creatinine Clearance: 12.2 ml/min (by C-G formula based on Cr of 9.57).  Basename 07/26/12 2132  VANCOTROUGH --  VANCOPEAK --  VANCORANDOM 32.1  GENTTROUGH --  GENTPEAK --  GENTRANDOM --  TOBRATROUGH --  TOBRAPEAK --  TOBRARND --  AMIKACINPEAK --  AMIKACINTROU --  AMIKACIN --     Microbiology: Recent Results (from the past 720 hour(s))  URINE CULTURE     Status: Normal   Collection Time   07/22/12  4:32 PM      Component Value Range Status Comment   Specimen Description URINE, CATHETERIZED   Final    Special Requests NONE   Final    Culture  Setup Time 07/23/2012 11:13   Final    Colony Count NO GROWTH   Final    Culture NO GROWTH   Final    Report Status 07/24/2012 FINAL   Final   CULTURE, BLOOD (ROUTINE X 2)     Status: Normal (Preliminary result)   Collection Time   07/22/12  6:42 PM      Component Value Range Status Comment   Specimen Description BLOOD LEFT ARM   Final    Special Requests BOTTLES DRAWN AEROBIC ONLY   Final    Culture  Setup Time 07/23/2012 10:16   Final    Culture     Final    Value:        BLOOD CULTURE RECEIVED NO GROWTH TO DATE CULTURE WILL BE HELD FOR 5  DAYS BEFORE ISSUING A FINAL NEGATIVE REPORT   Report Status PENDING   Incomplete   CULTURE, BLOOD (ROUTINE X 2)     Status: Normal (Preliminary result)   Collection Time   07/22/12  6:51 PM      Component Value Range Status Comment   Specimen Description BLOOD RIGHT ARM   Final    Special Requests BOTTLES DRAWN AEROBIC ONLY   Final    Culture  Setup Time 07/23/2012 10:16   Final    Culture     Final    Value:        BLOOD CULTURE RECEIVED NO GROWTH TO DATE CULTURE WILL BE HELD FOR 5 DAYS BEFORE ISSUING A FINAL NEGATIVE REPORT   Report Status PENDING   Incomplete   MRSA PCR SCREENING     Status: Normal   Collection Time   07/23/12 12:30 AM      Component Value Range Status Comment   MRSA by PCR NEGATIVE  NEGATIVE Final   CULTURE, BLOOD (ROUTINE X 2)     Status: Normal (Preliminary result)   Collection Time   07/25/12  8:30 AM      Component  Value Range Status Comment   Specimen Description BLOOD LEFT HAND   Final    Special Requests BOTTLES DRAWN AEROBIC AND ANAEROBIC 10CC   Final    Culture  Setup Time 07/25/2012 13:56   Final    Culture     Final    Value:        BLOOD CULTURE RECEIVED NO GROWTH TO DATE CULTURE WILL BE HELD FOR 5 DAYS BEFORE ISSUING A FINAL NEGATIVE REPORT   Report Status PENDING   Incomplete   CULTURE, BLOOD (ROUTINE X 2)     Status: Normal (Preliminary result)   Collection Time   07/25/12  8:40 AM      Component Value Range Status Comment   Specimen Description BLOOD RIGHT HAND   Final    Special Requests BOTTLES DRAWN AEROBIC AND ANAEROBIC 10CC   Final    Culture  Setup Time 07/25/2012 13:56   Final    Culture     Final    Value:        BLOOD CULTURE RECEIVED NO GROWTH TO DATE CULTURE WILL BE HELD FOR 5 DAYS BEFORE ISSUING A FINAL NEGATIVE REPORT   Report Status PENDING   Incomplete   URINE CULTURE     Status: Normal   Collection Time   07/25/12  8:49 AM      Component Value Range Status Comment   Specimen Description URINE, CATHETERIZED   Final     Special Requests NONE   Final    Culture  Setup Time 07/25/2012 15:30   Final    Colony Count NO GROWTH   Final    Culture NO GROWTH   Final    Report Status 07/26/2012 FINAL   Final     Anti-infectives     Start     Dose/Rate Route Frequency Ordered Stop   07/28/12 1300   vancomycin (VANCOCIN) 500 mg in sodium chloride 0.9 % 100 mL IVPB        500 mg 100 mL/hr over 60 Minutes Intravenous  Once 07/27/12 1017     07/26/12 1400  piperacillin-tazobactam (ZOSYN) IVPB 2.25 g       2.25 g 100 mL/hr over 30 Minutes Intravenous 3 times per day 07/26/12 1002     07/26/12 1200   vancomycin (VANCOCIN) IVPB 1000 mg/200 mL premix        1,000 mg 200 mL/hr over 60 Minutes Intravenous  Once 07/26/12 0916 07/26/12 1352   07/24/12 2200   piperacillin-tazobactam (ZOSYN) IVPB 3.375 g  Status:  Discontinued        3.375 g 12.5 mL/hr over 240 Minutes Intravenous 3 times per day 07/24/12 1109 07/26/12 0911   07/24/12 1600   vancomycin (VANCOCIN) IVPB 1000 mg/200 mL premix        1,000 mg 200 mL/hr over 60 Minutes Intravenous  Once 07/24/12 1058 07/24/12 1719   07/24/12 1400  piperacillin-tazobactam (ZOSYN) IVPB 3.375 g       3.375 g 100 mL/hr over 30 Minutes Intravenous  Once 07/24/12 1109 07/24/12 1434   07/23/12 1400   piperacillin-tazobactam (ZOSYN) IVPB 3.375 g  Status:  Discontinued        3.375 g 100 mL/hr over 30 Minutes Intravenous Every 6 hours 07/23/12 1440 07/24/12 1109   07/23/12 1300   piperacillin-tazobactam (ZOSYN) IVPB 3.375 g  Status:  Discontinued        3.375 g 12.5 mL/hr over 240 Minutes Intravenous 4 times per day 07/23/12 1216 07/23/12 1439  07/22/12 2200   piperacillin-tazobactam (ZOSYN) IVPB 3.375 g  Status:  Discontinued        3.375 g 12.5 mL/hr over 240 Minutes Intravenous 3 times per day 07/22/12 2156 07/23/12 1216   07/22/12 1930   vancomycin (VANCOCIN) IVPB 1000 mg/200 mL premix  Status:  Discontinued        1,000 mg 200 mL/hr over 60 Minutes Intravenous Every  24 hours 07/22/12 1831 07/24/12 1058   07/22/12 1815   Ampicillin-Sulbactam (UNASYN) 3 g in sodium chloride 0.9 % 100 mL IVPB  Status:  Discontinued        3 g 100 mL/hr over 60 Minutes Intravenous  Once 07/22/12 1806 07/22/12 2155          Assessment: 21 YOM who was brought in after being down due to heroin overdose with alcohol in his system as well. Suspected aspiration pneumonia and vancomycin and Zosyn have been started empirically. He was on CVVHD for ARF which was stopped on 10/22 prior to him going to surgery for an I&D of his upper right arm (compartment syndrome). He has been started on intermittent dialysis with his first session on 10/24 for 3.5hrs with a BFR of 13mL/min. Of note, he was supposed to given a supplemental vancomycin dose of 1000mg  IV after this session, but the piggy back was hung before the session. A post-dialysis vancomycin level 4 hours after the dialysis was completed was 32.1 so he did not need another dose. There is planned hemodialysis for 10/26. His SCr today is 9.5 and he is anuric. He is currently afebrile and has a WBC of 7.5.  Goal of Therapy:  Vancomycin trough level 15-20 mcg/ml Pre-HD vancomycin level of 15-25  Plan:  1. Continue Zosyn 2.25gm IV Q8hr 2. Give vancomycin 500mg  IV after dialysis on 10/26 since his level was elevated after the last dialysis session and you would expect a full session to clear ~50% of the vancomycin. His weight is borderline and would likely have required vancomycin 750mg  IV after the HD session, but would err on the cautious side due to the elevated level. 3. Follow hemodialysis plans, cultures and sensitivities, and clinical progression 4. Will draw vancomycin levels as clinically necessary  Macarius Ruark D. Safir Michalec, PharmD Clinical Pharmacist Pager: [phone] Phone: [phone] 07/27/2012 11:10 AM

## 2012-07-27 NOTE — Consult Note (Addendum)
WOC consult Note Reason for Consult: Consult requested to re-apply vac to right arm.  Dr Lajoyce Corners in earlier to remove previous dressing and assess site. Wound type: Full thickness post-op wound. Measurement: 23X3X.3cm Wound bed: beefy red with exposed muscles. Sutures intact above and below wound. Drainage (amount, consistency, odor) Mod pink drainage, no odor. Periwound: Generalized edema. Dressing procedure/placement/frequency: Applied one piece mepitel and one piece black foam to 75mm cont suction over upper arm.  Pt on vent and basically unresponsive during dressing change. Plan dressing change Mon.  Cammie Mcgee, RN, MSN, [employer]  [phone]

## 2012-07-27 NOTE — Progress Notes (Addendum)
[facility]  Subjective:  Intubated, sedated.  Dialyzed yesterday Weight 73.5 kg on admission--today 80.1 kg On no pressors--IVF 50 cc/hr On vanco, zosyn    Objective: Vital signs in last 24 hours: Blood pressure 108/49, pulse 88, temperature 97.8 F (36.6 C), temperature source Oral, resp. rate 15, height 5\' 7"  (1.702 m), weight 80.1 kg (176 lb 9.4 oz), SpO2 100.00%.    PHYSICAL EXAM General--as above Chest--rhonchi Heart--no rub Abd--nontender Extr--R upper arm post op, trace pretib edema, HD cath in R          Fem vein   Lab Results:   Lab 07/27/12 0450 07/26/12 0743 07/25/12 0443  NA 142 142 [id number]  K 3.4* 3.2* 3.63.6  CL [phone]  CO2 30 31 3129  BUN 52* 54* 36*36*  CREATININE 9.57* 8.95* 5.11*5.10*  ALB -- -- --  GLUCOSE 126* -- --  CALCIUM 8.0* 7.7* 7.6*7.6*  PHOS 3.4 3.4 3.1     Basename 07/27/12 0450 07/26/12 0430  WBC 7.5 7.5  HGB 7.8* 7.3*  HCT 22.8* 21.5*  PLT 120* 77*   I have reviewed the patient's current medications.  Assessment/Plan: 1. ARF--off CVVH. BP better--off pressors. CXR clear. Will dialyze tomorow  2. Metabolic acidosis--no ABG . Serum bicarb 30 today.  3. Hyperkalemia-- K 3.4 today.   4. RUE compartment syndrome--Dr. Lajoyce Corners following  5. Polysubstance abuse (heroin and alcohol)--mgt after recovery from acute issues  6. Fever--afebrile, cultures neg.  Continue on zosyn + vanco 7.  Anemia--hgb 7.5, Fe/TIBC 20 %, erritin 332--will RX IV Fe, then add aranesp     LOS: 5 days   Martin Chapman F 07/27/2012,8:52 AM   .labalb

## 2012-07-27 NOTE — Progress Notes (Signed)
[facility] ADULT ICU REPLACEMENT PROTOCOL FOR AM LAB REPLACEMENT ONLY  The patient does not apply for the [facility] Adult ICU Electrolyte Replacment Protocol based on the criteria listed below:   1. Is GFR >/= 50 ml/min? no  Patient's GFR today is 52  4. Abnormal electrolyte(s):k+3.4 5. Ordered repletion with: PER MD 6. If a panic level lab has been reported, has the CCM MD in charge been notified? yes.   Physician:  Dr Alyssa Grove, Lelon Perla 07/27/2012 6:57 AM

## 2012-07-27 NOTE — Progress Notes (Signed)
Patient ID: Martin Chapman, male   DOB: 05-31-1991, 21 y.o.   MRN: [id number] Wound vac removed.  No beefy granulation tissue.  Still ischemic muscle changes.  Wound nursing to re apply VAC, be careful no to put VAC on brachial artery.  Apply mepitel over anterior wound under the VAC.

## 2012-07-27 NOTE — Progress Notes (Signed)
Clinical Social Worker received request for substance abuse assessment on 07/26/2012. CSW has attempted to visit with patient over last two days yet unable to assess as patient remains sedated at this time.  CSW will continue to follow.  Carney Bern, LCSWA 07/27/2012 3:42 PM

## 2012-07-27 NOTE — Progress Notes (Signed)
Name: Martin Chapman MRN: [id number] DOB: [birth year]    LOS: 5  PULMONARY / CRITICAL CARE MEDICINE  HPI:  21 years old male with PMH relevant for heroin addiction, depression, anxiety and panic attacks. He overdosed on heroin and alcohol 10/19 PM  And 10/20 PM he was found unresponsive at about 2:00 pm. Pt now on vent, s/p compartment syndrome and release in OR of R shoulder muscles.   Events since admission: 10/20- Admitted to ICU 10/20 - Surgery with shoulder release and fasciotomy of right arm 10/21 - Started on CVVH>>d/c 10/22 10/22 - Return to OR for I&D with wound vac placement. Bicep excised  10/25 - Vac change  Current Status: Remains critically ill and requires vent. Febrile overnight to 100.8 but curve trending down.  Vital Signs: Temp:  [97.5 F (36.4 C)-100.9 F (38.3 C)] 97.8 F (36.6 C) (10/25 0749) Pulse Rate:  [73-122] 91  (10/25 0600) Resp:  [9-23] 14  (10/25 0600) BP: (100-165)/(43-92) 109/54 mmHg (10/25 0600) SpO2:  [92 %-100 %] 100 % (10/25 0600) FiO2 (%):  [30 %-40 %] 30 % (10/25 0600) Weight:  [176 lb 9.4 oz (80.1 kg)-180 lb 8.9 oz (81.9 kg)] 176 lb 9.4 oz (80.1 kg) (10/25 0400)  Physical Examination: General: sedated on vent, no acute distress while asleep Neuro: sedated on vent. Does not open eyes or follow commands HEENT:  ETT in place Neck:  Supple, no JVD   Cardiovascular:  RRR, no M/R/G Lungs:  CTA Abdomen:  Soft, nontender, nondistended, bowel sounds present Musculoskeletal: Dressing on R shoulder/ Right arm. Fasciotomy with sutures in place. Mittens on hands. Good radial pulses Skin:  No rash  Dg Chest Port 1 View  07/27/2012  *RADIOLOGY REPORT*  Clinical Data: Evaluate endotracheal tube.  PORTABLE CHEST - 1 VIEW  Comparison: 07/26/2012  Findings: Endotracheal tube is 4.2 cm above the carina.  Central line tip in the SVC region.  Improved aeration at the left lung base.  No focal airspace disease.  Heart size is normal. Nasogastric  tube in the stomach body region.  IMPRESSION: Improved aeration at the left lung base.  Support apparatuses as described.   Original Report Authenticated By: Richarda Overlie, M.D.    Dg Chest Port 1 View  07/26/2012  *RADIOLOGY REPORT*  Clinical Data: Atelectasis.  Shortness of breath.  PORTABLE CHEST - 1 VIEW  Comparison: Chest x-ray 07/25/2012.  Findings: An endotracheal tube is in place with tip 3.2 cm above the carina. There is a left-sided internal jugular central venous catheter with tip terminating in the superior cavoatrial junction. Lung volumes are low.  Bibasilar opacities (left greater than right), favored to represent areas of subsegmental atelectasis. Blunting of the left costophrenic sulcus favored to reflect a small left pleural effusion.  Crowding of the pulmonary vasculature, accentuated by low lung volumes.  Heart size is within normal limits. The patient is rotated to the right on today's exam, resulting in distortion of the mediastinal contours and reduced diagnostic sensitivity and specificity for mediastinal pathology.  IMPRESSION: 1.  Support apparatus, as above. 2.  Low lung volumes with worsening bibasilar atelectasis and new small left pleural effusion.   Original Report Authenticated By: Florencia Reasons, M.D.      Active problems: 1) Drug overdose (heroin, alcohol) 2) Rhabdomyolysis likely secondary to right arm injury 3) Right upper extremity compartment syndrome. Possible vascular injury 4) Acute renal failure secondary to volume depletion and rhabdomyolysis 5) Hyperkalemia 6) Elevated LFT's and INR, likely shock liver  7) Shock, likely hypovolemic, possibly septic. 8) Hypoxemia likely secondary to aspiration pneumonia / chemical pneumonitis  ASSESSMENT AND PLAN  PULMONARY  Lab 07/23/12 0049 07/22/12 2337 07/22/12 2240  PHART 7.266* 7.229* 7.179*  PCO2ART 36.1 44.2 47.5*  PO2ART 85.3 59.0* 72.0*  HCO3 15.9* 18.9* 17.7*  O2SAT 96.8 88.0 90.0   Ventilator  Settings: Vent Mode:  [-] PRVC FiO2 (%):  [30 %-40 %] 30 % Set Rate:  [14 bmp] 14 bmp Vt Set:  [530 mL] 530 mL PEEP:  [5 cmH20] 5 cmH20 Pressure Support:  [10 cmH20-14 cmH20] 10 cmH20 Plateau Pressure:  [17 cmH20-20 cmH20] 19 cmH20  CXR: 10/23 No acute infiltrates, ETT in place 2.3 cm above carina  A:   1) Hypoxemia likely secondary to aspiration pneumonia / pneumonitis P:   -Cont full vent support, hopefully wean soon -WUA/SBT daily -PCXR in AM to eval ett   CARDIOVASCULAR  Lab 07/24/12 0425 07/23/12 1550 07/23/12 0800 07/23/12 0050 07/22/12 2020 07/22/12 1920 07/22/12 1638  TROPONINI 1.91* 3.24* 1.78* 1.07* 0.62* -- --  LATICACIDVEN 2.4* -- -- -- -- 8.2* 10.7*  PROBNP -- -- -- -- -- -- --   ECG:  NSR Lines: Left IJ placed 07/22/12 >> R femoral HD cath 10/20>> Art line L radial 10/21>>10/24  Echo 10/22>>EF 55 to 60%  A:  1) Hypovolemic/septic shock>>off pressors 10/24. 2) Lactic acidosis likely secondary to shock and rhabdomyolysis>>improved 3) Right upper extremity injury with compartment syndrome and possible vascular injury Note arch aortogram in OR normal.  No vascular deficit in RUE/shoulder Elevated troponin P:  - Orthopedic surgery consult appreciated. Wound vac changed 10/25 - CK trending down   RENAL  Lab 07/27/12 0450 07/26/12 0743 07/25/12 0443 07/24/12 0425 07/23/12 1555 07/23/12 0156 07/22/12 2021  NA 142 142 [id number] 138 142 -- --  K 3.4* 3.2* -- -- -- -- --  CL [id number] 92* 100 -- --  CO2 30 31 3129 35* 32 -- --  BUN 52* 54* 36*36* 27* 27* -- --  CREATININE 9.57* 8.95* 5.11*5.10* 3.15* 2.72* -- --  CALCIUM 8.0* 7.7* 7.6*7.6* 7.0* 7.1* -- --  MG 2.5 2.7* -- 1.9 -- 2.3 4.2*  PHOS 3.4 3.4 3.1 2.8 4.6 -- --   Intake/Output      10/24 0701 - 10/25 0700 10/25 0701 - 10/26 0700   I.V. (mL/kg) 1674.1 (20.9)    NG/GT 1410    IV Piggyback 125    Total Intake(mL/kg) 3209.1 (40.1)    Urine (mL/kg/hr) 10 (0)    Drains 550    Other 3000     Total Output 3560    Net -350.9          Foley:  07/22/12  A:   1) Acute renal failure s/p CRRT 2) Rhabdomyolysis 3) Hyperkalemia>>>better. Now Hypokalemic CRRT>>Intermittent HD. Femoral HD cath placed in OR by vascular P:   - Plans for HD per renal. Started on intermittent HD 10/24 - Hypokalemia; monitor  GASTROINTESTINAL  Lab 07/27/12 0450 07/26/12 0743 07/25/12 0443 07/24/12 0425 07/23/12 1555 07/23/12 0500  AST 550* 690* 968* 1112* -- 1133*  ALT 200* 211* 290* 389* -- 454*  ALKPHOS 84 85 70 71 -- 65  BILITOT 0.4 0.7 1.4* 2.4* -- 2.0*  PROT 4.8* 4.5* 4.5* 4.4* -- 4.5*  ALBUMIN 2.2* 2.1* 2.3*2.3* 2.4*2.3* 2.7* --    A:   1) Elevated LFT's and INR likely shock liver P:   - LFTs improving - Will continue to monitor; repeat  CMet in AM - GI prophylaxis with Protonix - Con't TF at goal while intubated   HEMATOLOGIC  Lab 07/27/12 0450 07/26/12 0430 07/25/12 1600 07/25/12 0443 07/24/12 0425 07/23/12 0500 07/23/12 0050 07/22/12 2021  HGB 7.8* 7.3* 7.5* 8.1* 10.7* -- -- --  HCT 22.8* 21.5* 22.1* 24.1* 30.6* -- -- --  PLT 120* 77* 77* 86* 135* -- -- --  INR -- -- -- -- 1.65* 1.64* 2.39* 2.01*  APTT -- -- -- -- 32 -- 43* 46*   A:   1) Elevated INR likely from shock liver P:  - HgB stable. Transfuse <7 - Thrombocytopenia improving; cont to monitor - SQ heparin for DVT ppx   INFECTIOUS  Lab 07/27/12 0450 07/26/12 0430 07/25/12 1600 07/25/12 0443 07/24/12 0425 07/23/12 1200 07/22/12 2023  WBC 7.5 7.5 9.1 10.0 17.4* -- --  PROCALCITON -- -- -- 20.19 29.45 31.40 6.62   Cultures: Blood culture 10/20>> NGTD Urine culture 10/20>> No growth BCx 10/23>> NGTD UCX 10/23 >>No growth  Antibiotics: Zosyn and vancomycin 10/20  A:   1) Aspiration pneumonia. 2) Rt arm compartment syndrome P:   - Currently febrile on antibiotics>>fever curving improving - Continue antibiotics as above - s/p fasciotomy, I&D and biceps excision for necrosis after RUE injury/compartment  syndrome   ENDOCRINE  Lab 07/27/12 0734 07/27/12 0420 07/27/12 0027 07/26/12 1950 07/26/12 1557  GLUCAP 120* 123* 112* 149* 91   A:   1) No history of diabetes . CBG now stable P:   - POCT glucose q4hrs - IVF to [facility]   NEUROLOGIC  A:   1) Acute encephalopathy from OD; sedated on vent  P:   -sedation protocol, minimize as tolerated - daily WUA   BEST PRACTICE / DISPOSITION - Level of Care:  ICU - Primary Service:  PCCM - Consultants:  Vascular surgery (signed off), orthopedic surgery, nephrology - Code Status:  Full code - Diet:  NPO - DVT Px:  SCD, Hep SQ - GI Px:  Protonix - Skin Integrity:  Intact - Social / Family:  Father updated 10/23  Amber M. Hairford, M.D. 07/27/2012 7:58 AM   Reviewed above, examined pt, and agree with assessment/plan.  Tolerating weaning, but mental status precludes extubation at present.  Hemodynamics improved.  Continue Abx for sepsis.  Likely will need chronic dialsysis.  Critical care time 35 minutes.  Coralyn Helling, MD [facility] Pulmonary/Critical Care 07/27/2012, 3:26 PM Pager:  [phone] After 3pm call: [phone]

## 2012-07-27 NOTE — Progress Notes (Signed)
07/27/12 1300  Clinical Encounter Type  Visited With Patient and family together  Visit Type Follow-up   I visited with the patient's father and grandmother. Father said Dyshawn was doing okay, no changes. I will continue to visit. Veryl Speak

## 2012-07-28 ENCOUNTER — Inpatient Hospital Stay (HOSPITAL_COMMUNITY): Payer: BC Managed Care – PPO

## 2012-07-28 DIAGNOSIS — A0472 Enterocolitis due to Clostridium difficile, not specified as recurrent: Secondary | ICD-10-CM | POA: Diagnosis not present

## 2012-07-28 LAB — CBC
HCT: 22.8 % — ABNORMAL LOW (ref 39.0–52.0)
Hemoglobin: 7.9 g/dL — ABNORMAL LOW (ref 13.0–17.0)
MCH: 28.7 pg (ref 26.0–34.0)
MCHC: 34.6 g/dL (ref 30.0–36.0)
MCV: 82.9 fL (ref 78.0–100.0)
RBC: 2.75 MIL/uL — ABNORMAL LOW (ref 4.22–5.81)

## 2012-07-28 LAB — COMPREHENSIVE METABOLIC PANEL
ALT: 140 U/L — ABNORMAL HIGH (ref 0–53)
AST: 253 U/L — ABNORMAL HIGH (ref 0–37)
Albumin: 2 g/dL — ABNORMAL LOW (ref 3.5–5.2)
CO2: 28 mEq/L (ref 19–32)
Calcium: 8.2 mg/dL — ABNORMAL LOW (ref 8.4–10.5)
Creatinine, Ser: 12.32 mg/dL — ABNORMAL HIGH (ref 0.50–1.35)
GFR calc non Af Amer: 5 mL/min — ABNORMAL LOW (ref >90)
Sodium: 143 mEq/L (ref 135–145)

## 2012-07-28 LAB — GLUCOSE, CAPILLARY
Glucose-Capillary: 94 mg/dL (ref 70–99)
Glucose-Capillary: 89 mg/dL (ref 70–99)
Glucose-Capillary: 84 mg/dL (ref 70–99)
Glucose-Capillary: 94 mg/dL (ref 70–99)
Glucose-Capillary: 107 mg/dL — ABNORMAL HIGH (ref 70–99)

## 2012-07-28 LAB — TYPE AND SCREEN: ABO/RH(D): O POS

## 2012-07-28 LAB — CLOSTRIDIUM DIFFICILE BY PCR: Toxigenic C. Difficile by PCR: POSITIVE — AB

## 2012-07-28 LAB — PHOSPHORUS: Phosphorus: 5 mg/dL — ABNORMAL HIGH (ref 2.3–4.6)

## 2012-07-28 MED ORDER — VANCOMYCIN 50 MG/ML ORAL SOLUTION
500.0000 mg | Freq: Four times a day (QID) | ORAL | Status: DC
  Administered 2012-07-28 – 2012-08-11 (×55): 500 mg
  Filled 2012-07-28 (×61): qty 10

## 2012-07-28 MED ORDER — FAMOTIDINE 40 MG/5ML PO SUSR
20.0000 mg | Freq: Two times a day (BID) | ORAL | Status: DC
  Administered 2012-07-28 – 2012-07-29 (×3): 20 mg
  Filled 2012-07-28 (×4): qty 2.5

## 2012-07-28 MED ORDER — SACCHAROMYCES BOULARDII 250 MG PO CAPS
250.0000 mg | ORAL_CAPSULE | Freq: Two times a day (BID) | ORAL | Status: DC
  Administered 2012-07-28 – 2012-08-20 (×44): 250 mg via ORAL
  Filled 2012-07-28 (×50): qty 1

## 2012-07-28 MED ORDER — DARBEPOETIN ALFA-POLYSORBATE 150 MCG/0.3ML IJ SOLN
INTRAMUSCULAR | Status: AC
  Administered 2012-07-28: 150 ug via INTRAVENOUS
  Filled 2012-07-28: qty 0.3

## 2012-07-28 NOTE — Progress Notes (Signed)
Name: Martin Chapman MRN: [id number] DOB: 22-Dec-1990    LOS: 6  PULMONARY / CRITICAL CARE MEDICINE  HPI:  21 years old male with PMH relevant for heroin addiction, depression, anxiety and panic attacks. He overdosed on heroin and alcohol 10/19 PM  And 10/20 PM he was found unresponsive at about 2:00 pm. Pt now on vent, s/p compartment syndrome and release in OR of R shoulder muscles.   Events since admission: 10/20- Admitted to ICU 10/20 - Surgery with shoulder release and fasciotomy of right arm 10/21 - Started on CVVH>>d/c 10/22 10/22 - Return to OR for I&D with wound vac placement. Bicep excised  10/25 - Vac change 10/26 - diarrhea  Current Status: Developed diarrhea overnight.  Getting HD this AM>>plan to remove 4 liters.  Vital Signs: Temp:  [97.8 F (36.6 C)-100.6 F (38.1 C)] 97.8 F (36.6 C) (10/26 0719) Pulse Rate:  [82-125] 90  (10/26 0945) Resp:  [0-21] 14  (10/26 0945) BP: (88-136)/(39-71) 102/57 mmHg (10/26 0945) SpO2:  [96 %-100 %] 100 % (10/26 0945) FiO2 (%):  [30 %] 30 % (10/26 0731) Weight:  [180 lb 5.4 oz (81.8 kg)-180 lb 12.4 oz (82 kg)] 180 lb 12.4 oz (82 kg) (10/26 0700)  Physical Examination: General - no distress HEENT - ETT in place Cardiac - s1s2 regular Chest - no wheeze Abd - soft, nontender Ext - wound vac in Rt upper arm Neuro - sedated  Dg Chest Port 1 View  07/28/2012  *RADIOLOGY REPORT*  Clinical Data: Drug overdose, intubated  PORTABLE CHEST - 1 VIEW  Comparison:   the previous day's study  Findings: Endotracheal tube, nasogastric tube, and left IJ central line are stable in position.  Some improvement in the perihilar edema.  Linear atelectasis or early infiltrate in the right lower lung has not significantly changed.  No effusion.  Heart size normal.  IMPRESSION:  1.  Improved   perihilar opacities. 2. Support hardware stable in position.   Original Report Authenticated By: Osa Craver, M.D.    Dg Chest Port 1  View  07/27/2012  *RADIOLOGY REPORT*  Clinical Data: Evaluate endotracheal tube.  PORTABLE CHEST - 1 VIEW  Comparison: 07/26/2012  Findings: Endotracheal tube is 4.2 cm above the carina.  Central line tip in the SVC region.  Improved aeration at the left lung base.  No focal airspace disease.  Heart size is normal. Nasogastric tube in the stomach body region.  IMPRESSION: Improved aeration at the left lung base.  Support apparatuses as described.   Original Report Authenticated By: Richarda Overlie, M.D.     ASSESSMENT AND PLAN  PULMONARY  Lab 07/23/12 0049 07/22/12 2337 07/22/12 2240  PHART 7.266* 7.229* 7.179*  PCO2ART 36.1 44.2 47.5*  PO2ART 85.3 59.0* 72.0*  HCO3 15.9* 18.9* 17.7*  O2SAT 96.8 88.0 90.0   Ventilator Settings: Vent Mode:  [-] PRVC FiO2 (%):  [30 %] 30 % Set Rate:  [14 bmp] 14 bmp Vt Set:  [530 mL] 530 mL PEEP:  [5 cmH20] 5 cmH20 Pressure Support:  [10 cmH20] 10 cmH20 Plateau Pressure:  [16 cmH20-20 cmH20] 18 cmH20  A:   1) Acute hypoxic respiratory failure likely secondary to aspiration pneumonia / pneumonitis P:   Continue vent support until OR trips completed Pressure support wean as tolerated F/u CXR Adjust oxygen to keep Spo2 > 92%   CARDIOVASCULAR  Lab 07/24/12 0425 07/23/12 1550 07/23/12 0800 07/23/12 0050 07/22/12 2020 07/22/12 1920 07/22/12 1638  TROPONINI 1.91* 3.24*  1.78* 1.07* 0.62* -- --  LATICACIDVEN 2.4* -- -- -- -- 8.2* 10.7*  PROBNP -- -- -- -- -- -- --    Lines:  Left IJ placed 07/22/12 >> R femoral HD cath 10/20>> Art line L radial 10/21>>10/24  Echo 10/22>>EF 55 to 60%  A:  1) Hypovolemic/septic shock>>off pressors 10/24. 2) Lactic acidosis likely secondary to shock and rhabdomyolysis>>resolved 3) Right upper extremity injury with compartment syndrome and possible vascular injury Note arch aortogram in OR normal.  No vascular deficit in RUE/shoulder Elevated troponin P:  F/u CPK Wound care per ortho and wound care  team   RENAL  Lab 07/28/12 0453 07/27/12 0450 07/26/12 0743 07/25/12 0443 07/24/12 0425 07/23/12 0156 07/22/12 2021  NA 143 142 142 140141 138 -- --  K 3.8 3.4* -- -- -- -- --  CL 104 [phone] 92* -- --  CO2 28 30 31  3129 35* -- --  BUN 70* 52* 54* 36*36* 27* -- --  CREATININE 12.32* 9.57* 8.95* 5.11*5.10* 3.15* -- --  CALCIUM 8.2* 8.0* 7.7* 7.6*7.6* 7.0* -- --  MG -- 2.5 2.7* -- 1.9 2.3 4.2*  PHOS 5.0* 3.4 3.4 3.1 2.8 -- --   Intake/Output      10/25 0701 - 10/26 0700 10/26 0701 - 10/27 0700   I.V. (mL/kg) 1156 (14.1)    NG/GT 1260    IV Piggyback 175    Total Intake(mL/kg) 2591 (31.6)    Urine (mL/kg/hr)     Drains 450    Other     Total Output 450    Net +2141         Stool Occurrence 6 x     Foley:  07/22/12  A:   1) Acute renal failure s/p CRRT>>transitioned to intermittent HD 2) Rhabdomyolysis 3) Hyperkalemia>>>resolved P:   HD per renal F/u Renal panel  GASTROINTESTINAL  Lab 07/28/12 0453 07/27/12 0450 07/26/12 0743 07/25/12 0443 07/24/12 0425  AST 253* 550* 690* 968* 1112*  ALT 140* 200* 211* 290* 389*  ALKPHOS 66 84 85 70 71  BILITOT 0.4 0.4 0.7 1.4* 2.4*  PROT 4.7* 4.8* 4.5* 4.5* 4.4*  ALBUMIN 2.0* 2.2* 2.1* 2.3*2.3* 2.4*2.3*    A:   1) Elevated LFT's and INR likely shock liver 2) Nutrition 3) Diarrhea>>developed 10/26 P:   F/u LFT Continue tube feeds  HEMATOLOGIC  Lab 07/28/12 0453 07/27/12 0450 07/26/12 0430 07/25/12 1600 07/25/12 0443 07/24/12 0425 07/23/12 0500 07/23/12 0050 07/22/12 2021  HGB 7.9* 7.8* 7.3* 7.5* 8.1* -- -- -- --  HCT 22.8* 22.8* 21.5* 22.1* 24.1* -- -- -- --  PLT 130* 120* 77* 77* 86* -- -- -- --  INR -- -- -- -- -- 1.65* 1.64* 2.39* 2.01*  APTT -- -- -- -- -- 32 -- 43* 46*   A:   1) Elevated INR likely from shock liver>>resolved 2) Anemia of critical illness 3) Thrombocytopenia P:  F/u CBC Transfuse for Hb < 7   INFECTIOUS  Lab 07/28/12 0453 07/27/12 0450 07/26/12 0430 07/25/12 1600 07/25/12  0443 07/24/12 0425 07/23/12 1200 07/22/12 2023  WBC 9.4 7.5 7.5 9.1 10.0 -- -- --  PROCALCITON -- -- -- -- 20.19 29.45 31.40 6.62   Cultures: Blood culture 10/20>> Urine culture 10/20>> negative BCx 10/23>>  UCX 10/23 >>negative C diff 10/26>>  Antibiotics: Zosyn 10/20>> Vancomycin 10/20>>  A:   1) Aspiration pneumonia. 2) Rt arm compartment syndrome 3) Recurrent fever>>10/23 4) Diarrhea 10/26 P:   Continue vancomycin and zosyn>>narrow abx  once cx results final F/u stool c diff PCR   ENDOCRINE  Lab 07/28/12 0735 07/28/12 0618 07/28/12 0408 07/28/12 0303 07/28/12 0039  GLUCAP 88 84 89 94 121*   A:   1) Hyperglycemia>>improved; no Hx of DM P:   - POCT glucose q4hrs - IVF to [facility]   NEUROLOGIC  A:   1) Acute encephalopathy from OD; sedated on vent  P:   -sedation protocol, minimize as tolerated - daily WUA   BEST PRACTICE / DISPOSITION - Level of Care:  ICU - Primary Service:  PCCM - Consultants:  Vascular surgery (signed off), orthopedic surgery, nephrology - Code Status:  Full code - Diet:  NPO - DVT Px:  SCD, Hep SQ - GI Px:  Protonix - Skin Integrity:  Intact - Social / Family:  No family at bedside   Critical care time 35 minutes.  Coralyn Helling, MD [facility] Pulmonary/Critical Care 07/28/2012, 9:54 AM Pager:  [phone] After 3pm call: [phone]

## 2012-07-28 NOTE — Procedures (Signed)
[facility]  On HD via R fem HD cath BP--90/50 Goal--5 L (weight today 82 Kg--weight on admission 73.5 kg) Hgb--7.9  (hgb has been decresing 13.6--10.7--8.1--7.9 today)      He has not received PRBC yest.  Will send T & screen K+--3.8 (on 4 K bath) Ca--8.2, BUN 70, Cr 12.3 today

## 2012-07-28 NOTE — Significant Event (Signed)
C diff PCR positive.  Will start enteral vancomycin, change protonix to pepcid, and add florastor.  No evidence for MRSA so will d/c IV vancomycin.  Continue zosyn for now for aspiration pneumonia and right arm wound.  Updated family about plan.  Coralyn Helling, MD [facility] Pulmonary/Critical Care 07/28/2012, 1:04 PM Pager:  [phone] After 3pm call: [phone]

## 2012-07-28 NOTE — Progress Notes (Signed)
Diarrhea   C. Diff; flexi seal ordered

## 2012-07-29 ENCOUNTER — Inpatient Hospital Stay (HOSPITAL_COMMUNITY): Payer: BC Managed Care – PPO

## 2012-07-29 LAB — COMPREHENSIVE METABOLIC PANEL
Alkaline Phosphatase: 70 U/L (ref 39–117)
BUN: 59 mg/dL — ABNORMAL HIGH (ref 6–23)
GFR calc Af Amer: 7 mL/min — ABNORMAL LOW (ref >90)
Glucose, Bld: 114 mg/dL — ABNORMAL HIGH (ref 70–99)
Potassium: 4 mEq/L (ref 3.5–5.1)
Total Protein: 5.3 g/dL — ABNORMAL LOW (ref 6.0–8.3)

## 2012-07-29 LAB — CBC
Hemoglobin: 8.5 g/dL — ABNORMAL LOW (ref 13.0–17.0)
MCHC: 34 g/dL (ref 30.0–36.0)
Platelets: 185 10*3/uL (ref 150–400)
RDW: 13.4 % (ref 11.5–15.5)

## 2012-07-29 LAB — GLUCOSE, CAPILLARY: Glucose-Capillary: 107 mg/dL — ABNORMAL HIGH (ref 70–99)

## 2012-07-29 LAB — CULTURE, BLOOD (ROUTINE X 2)
Culture: NO GROWTH
Culture: NO GROWTH

## 2012-07-29 LAB — PHOSPHORUS: Phosphorus: 5 mg/dL — ABNORMAL HIGH (ref 2.3–4.6)

## 2012-07-29 MED ORDER — FENTANYL CITRATE 0.05 MG/ML IJ SOLN
25.0000 ug | INTRAMUSCULAR | Status: DC | PRN
  Administered 2012-07-30 – 2012-08-04 (×8): 50 ug via INTRAVENOUS
  Filled 2012-07-29 (×9): qty 2

## 2012-07-29 MED ORDER — HEPARIN SODIUM (PORCINE) 1000 UNIT/ML DIALYSIS
20.0000 [IU]/kg | INTRAMUSCULAR | Status: DC | PRN
  Administered 2012-07-30: 1600 [IU] via INTRAVENOUS_CENTRAL
  Filled 2012-07-29: qty 2

## 2012-07-29 MED ORDER — DEXMEDETOMIDINE HCL IN NACL 200 MCG/50ML IV SOLN
0.2000 ug/kg/h | INTRAVENOUS | Status: DC
  Administered 2012-07-29: 0.7 ug/kg/h via INTRAVENOUS
  Administered 2012-07-29: 0.2 ug/kg/h via INTRAVENOUS
  Administered 2012-07-30 (×3): 0.7 ug/kg/h via INTRAVENOUS
  Filled 2012-07-29: qty 50
  Filled 2012-07-29: qty 100
  Filled 2012-07-29 (×2): qty 50

## 2012-07-29 MED ORDER — FAMOTIDINE IN NACL 20-0.9 MG/50ML-% IV SOLN
20.0000 mg | Freq: Two times a day (BID) | INTRAVENOUS | Status: DC
  Administered 2012-07-29 – 2012-07-30 (×3): 20 mg via INTRAVENOUS
  Filled 2012-07-29 (×6): qty 50

## 2012-07-29 MED ORDER — LORAZEPAM 2 MG/ML IJ SOLN
1.0000 mg | INTRAMUSCULAR | Status: DC | PRN
  Administered 2012-07-29 – 2012-08-05 (×39): 2 mg via INTRAVENOUS
  Filled 2012-07-29 (×41): qty 1

## 2012-07-29 NOTE — Progress Notes (Signed)
Informed Dr. Magnus Ivan that the pt's wound vac dressing was not functioning correctly due to excessive pt movement during a period of agitation.  A large amount of serosanguinous fluid was leaking around the dressing.  The wound vac had become disconnected from the dressing, and a portion of the dressing had ripped.  An attempt to fix the connection and tear was unsuccessful.  WOC nurse was paged with no reply.  Dr. Magnus Ivan ordered the wound vac dressing to be removed and the placement of a wet to dry dressing combined with abd pads, curlex, and an ace bandage. Per MD order, wound vac dressing was removed, and wet-dry dressing was placed using sterile technique.

## 2012-07-29 NOTE — Significant Event (Signed)
Pt agitated after extubation.  Concern that he could cause more trauma to Rt arm.  Will start on precedex gtt and monitor.  Coralyn Helling, MD [facility] Pulmonary/Critical Care 07/29/2012, 3:01 PM Pager:  [phone] After 3pm call: [phone]

## 2012-07-29 NOTE — Progress Notes (Addendum)
[facility]  Subjective:  Intubated, sedated. Dialyzed yesterday  Weight 73.5 kg on admission--today 81.2 kg  On no pressors--osmolite 1.5 @50 /hr via OG On vanco, zosyn Stool now C. diff+ --(on vanco via OG tube)    Objective: Vital signs in last 24 hours: Blood pressure 130/67, pulse 96, temperature 100.3 F (37.9 C), temperature source Oral, resp. rate 20, height 5\' 7"  (1.702 m), weight 81.2 kg (179 lb 0.2 oz), SpO2 99.00%.    PHYSICAL EXAM General--as above  Chest--rhonchi  Heart--no rub  Abd--nontender, bladder not distended, foley and condom cath have been d/c'd  Extr--R upper arm post op, trace pretib edema, HD cath in R fem vein    Lab Results:   Lab 07/29/12 0456 07/28/12 0453 07/27/12 0450  NA 138 143 142  K 4.0 3.8 3.4*  CL 100 104 103  CO2 27 28 30   BUN 59* 70* 52*  CREATININE 10.41* 12.32* 9.57*  ALB -- -- --  GLUCOSE 114* -- --  CALCIUM 8.6 8.2* 8.0*  PHOS 5.0* 5.0* 3.4     Basename 07/29/12 0456 07/28/12 0453  WBC 9.1 9.4  HGB 8.5* 7.9*  HCT 25.0* 22.8*  PLT 185 130*     I have reviewed the patient's current medications.  On vanco via OG; on zosyn, IV vanco d/c'd   Assessment/Plan: 1. ARF--off CVVH. BP better--off pressors. CXR clear. Will dialyze tomorow  2. Metabolic acidosis--no ABG . Serum bicarb 30 today.  3. Hyperkalemia-- K 4  today.  4. RUE compartment syndrome--Dr. Lajoyce Chapman following  5. Polysubstance abuse (heroin and alcohol)--mgt after recovery from acute issues  6. Fever--c. Diff +; T 100.3 , cultures neg. Continue on zosyn +  OG vanco  7. Anemia--hgb 8.5, Fe/TIBC 20 %, ferritin 332--on  IV Fe and aranesp     LOS: 7 days   Martin Chapman F 07/29/2012,9:09 AM   .labalb

## 2012-07-29 NOTE — Progress Notes (Signed)
Patient ID: Martin Chapman, male   DOB: [birth year], 21 y.o.   MRN: [id number] Right arm with soft compartment.  VAC in place, but some leakage/loss of seal.  Will likely need new VAC change soon.

## 2012-07-29 NOTE — Progress Notes (Addendum)
Patient very agitated and belligerent off of sedation.  Positive for delirium.  Sitter and 2 RNs at bedside trying to deescalate situation.  Wound vac not operational due to sanguinous drainage.  Dressing is leaking due to patient movement and trying to get out of bed.  Great concern that patient will cause further trauma to arm.  Call placed to Dr. Craige Cotta with orders to start Precedex infusion. Call placed to Dr. Magnus Ivan for concerns with wound. Will continue to closely monitor patient.

## 2012-07-29 NOTE — Progress Notes (Signed)
Name: Martin Chapman MRN: [id number] DOB: 1991-03-20    LOS: 7  PULMONARY / CRITICAL CARE MEDICINE  HPI:  21 years old male with PMH relevant for heroin addiction, depression, anxiety and panic attacks. He overdosed on heroin and alcohol 10/19 PM  And 10/20 PM he was found unresponsive at about 2:00 pm. Pt now on vent, s/p compartment syndrome and release in OR of R shoulder muscles.   Events since admission: 10/20- Admitted to ICU 10/20 - Surgery with shoulder release and fasciotomy of right arm 10/21 - Started on CVVH>>d/c 10/22 10/22 - Return to OR for I&D with wound vac placement. Bicep excised  10/25 - Vac change 10/26 - diarrhea>>C diff positive  Current Status: Tolerating pressure support.  Vital Signs: Temp:  [98.7 F (37.1 C)-102.3 F (39.1 C)] 100.3 F (37.9 C) (10/27 0746) Pulse Rate:  [86-100] 86  (10/27 0900) Resp:  [0-20] 14  (10/27 0900) BP: (87-135)/(42-70) 126/67 mmHg (10/27 0900) SpO2:  [96 %-100 %] 100 % (10/27 0900) FiO2 (%):  [30 %] 30 % (10/27 0900) Weight:  [174 lb 9.7 oz (79.2 kg)-179 lb 0.2 oz (81.2 kg)] 179 lb 0.2 oz (81.2 kg) (10/27 0500)  Physical Examination: General - no distress HEENT - ETT in place Cardiac - s1s2 regular Chest - no wheeze Abd - soft, nontender Ext - wound vac in Rt upper arm Neuro - somnolent  Dg Chest Port 1 View  07/29/2012  *RADIOLOGY REPORT*  Clinical Data: Atelectasis.  PORTABLE CHEST - 1 VIEW  Comparison: 07/28/2012.  Findings: Endotracheal tube tip 1.9 cm above the carina.  Left central line tip mid superior vena cava level.  Nasogastric tube side hole just beyond the gastroesophageal junction.  No gross pneumothorax.  Mild central pulmonary vascular prominence.  No segmental consolidation.  Oblique subsegmental atelectasis right midlung.  IMPRESSION: No significant change in the subsegmental atelectasis right midlung.   Original Report Authenticated By: Fuller Canada, M.D.    Dg Chest Port 1  View  07/28/2012  *RADIOLOGY REPORT*  Clinical Data: Drug overdose, intubated  PORTABLE CHEST - 1 VIEW  Comparison:   the previous day's study  Findings: Endotracheal tube, nasogastric tube, and left IJ central line are stable in position.  Some improvement in the perihilar edema.  Linear atelectasis or early infiltrate in the right lower lung has not significantly changed.  No effusion.  Heart size normal.  IMPRESSION:  1.  Improved   perihilar opacities. 2. Support hardware stable in position.   Original Report Authenticated By: Osa Craver, M.D.     ASSESSMENT AND PLAN  PULMONARY  Lab 07/23/12 0049 07/22/12 2337 07/22/12 2240  PHART 7.266* 7.229* 7.179*  PCO2ART 36.1 44.2 47.5*  PO2ART 85.3 59.0* 72.0*  HCO3 15.9* 18.9* 17.7*  O2SAT 96.8 88.0 90.0   Ventilator Settings: Vent Mode:  [-] PRVC FiO2 (%):  [30 %] 30 % Set Rate:  [14 bmp] 14 bmp Vt Set:  [530 mL] 530 mL PEEP:  [5 cmH20] 5 cmH20 Plateau Pressure:  [17 cmH20-18 cmH20] 18 cmH20  A:   1) Acute hypoxic respiratory failure likely secondary to aspiration pneumonia / pneumonitis P:   Pressure support wean as tolerated>>hopefully extubate soon F/u CXR Adjust oxygen to keep Spo2 > 92%   CARDIOVASCULAR  Lab 07/24/12 0425 07/23/12 1550 07/23/12 0800 07/23/12 0050 07/22/12 2020 07/22/12 1920 07/22/12 1638  TROPONINI 1.91* 3.24* 1.78* 1.07* 0.62* -- --  LATICACIDVEN 2.4* -- -- -- -- 8.2* 10.7*  PROBNP -- -- -- -- -- -- --  Lines:  Left IJ placed 07/22/12 >> R femoral HD cath 10/20>> Art line L radial 10/21>>10/24  Echo 10/22>>EF 55 to 60%  A:  1) Hypovolemic/septic shock>>off pressors 10/24. 2) Lactic acidosis likely secondary to shock and rhabdomyolysis>>resolved 3) Right upper extremity injury with compartment syndrome and possible vascular injury Note arch aortogram in OR normal.  No vascular deficit in RUE/shoulder Elevated troponin P:  F/u CPK Wound care per ortho and wound care  team   RENAL  Lab 07/29/12 0456 07/28/12 0453 07/27/12 0450 07/26/12 0743 07/25/12 0443 07/24/12 0425 07/23/12 0156 07/22/12 2021  NA 138 143 142 142 140141 -- -- --  K 4.0 3.8 -- -- -- -- -- --  CL 100 104 [phone]) [phone] -- -- --  CO2 27 28 30 31  3129 -- -- --  BUN 59* 70* 52* 54* 36*36* -- -- --  CREATININE 10.41* 12.32* 9.57* 8.95* 5.11*5.10* -- -- --  CALCIUM 8.6 8.2* 8.0* 7.7* 7.6*7.6* -- -- --  MG -- -- 2.5 2.7* -- 1.9 2.3 4.2*  PHOS 5.0* 5.0* 3.4 3.4 3.1 -- -- --   Intake/Output      10/26 0701 - 10/27 0700 10/27 0701 - 10/28 0700   I.V. (mL/kg) 1069.5 (13.2) 57 (0.7)   NG/GT 1010 80   IV Piggyback 50    Total Intake(mL/kg) 2129.5 (26.2) 137 (1.7)   Drains 150 100   Other 3430    Stool 300 100   Total Output 3880 200   Net -1750.5 -63         Foley:  07/22/12  A:   1) Acute renal failure s/p CRRT>>transitioned to intermittent HD 2) Rhabdomyolysis 3) Hyperkalemia>>>resolved P:   HD per renal F/u Renal panel  GASTROINTESTINAL  Lab 07/29/12 0456 07/28/12 0453 07/27/12 0450 07/26/12 0743 07/25/12 0443  AST 166* 253* 550* 690* 968*  ALT 116* 140* 200* 211* 290*  ALKPHOS 70 66 84 85 70  BILITOT 0.3 0.4 0.4 0.7 1.4*  PROT 5.3* 4.7* 4.8* 4.5* 4.5*  ALBUMIN 2.2* 2.0* 2.2* 2.1* 2.3*2.3*    A:   1) Elevated LFT's and INR likely shock liver 2) Nutrition 3) Diarrhea 10/26 from C diff P:   F/u LFT Continue tube feeds  HEMATOLOGIC  Lab 07/29/12 0456 07/28/12 0453 07/27/12 0450 07/26/12 0430 07/25/12 1600 07/24/12 0425 07/23/12 0500 07/23/12 0050 07/22/12 2021  HGB 8.5* 7.9* 7.8* 7.3* 7.5* -- -- -- --  HCT 25.0* 22.8* 22.8* 21.5* 22.1* -- -- -- --  PLT 185 130* 120* 77* 77* -- -- -- --  INR -- -- -- -- -- 1.65* 1.64* 2.39* 2.01*  APTT -- -- -- -- -- 32 -- 43* 46*   A:   1) Elevated INR likely from shock liver>>resolved 2) Anemia of critical illness 3) Thrombocytopenia>>improving P:  F/u CBC Transfuse for Hb < 7   INFECTIOUS  Lab 07/29/12 0456  07/28/12 0453 07/27/12 0450 07/26/12 0430 07/25/12 1600 07/25/12 0443 07/24/12 0425 07/23/12 1200 07/22/12 2023  WBC 9.1 9.4 7.5 7.5 9.1 -- -- -- --  PROCALCITON -- -- -- -- -- 20.19 29.45 31.40 6.62   Cultures: Blood culture 10/20>> Urine culture 10/20>> negative BCx 10/23>>  UCX 10/23 >>negative C diff 10/26>>Positive  Antibiotics: Zosyn 10/20>> IV Vancomycin 10/20>>10/26 PO vancomycin 10/26>>   A:   1) Aspiration pneumonia. 2) Rt arm compartment syndrome 3) Recurrent fever>>10/23 4) Diarrhea 10/26 from C diff P:   Continue zosyn>>narrow abx once cx results final Continue po vancomycin  ENDOCRINE  Lab 07/29/12 0735 07/29/12 0450 07/29/12 0014 07/28/12 2004 07/28/12 1523  GLUCAP 145* 107* 138* 107* 94   A:   1) Hyperglycemia>>improved; no Hx of DM P:   - POCT glucose q4hrs - IVF to [facility]   NEUROLOGIC  A:   1) Acute encephalopathy from OD; sedated on vent  P:   -sedation protocol, minimize as tolerated - daily WUA   BEST PRACTICE / DISPOSITION - Level of Care:  ICU - Primary Service:  PCCM - Consultants:  Vascular surgery (signed off), orthopedic surgery, nephrology - Code Status:  Full code - Diet:  NPO - DVT Px:  SCD, Hep SQ - GI Px:  Protonix - Skin Integrity:  Intact - Social / Family:  No family at bedside   Critical care time 35 minutes.  Coralyn Helling, MD [facility] Pulmonary/Critical Care 07/29/2012, 9:45 AM Pager:  [phone] After 3pm call: [phone]

## 2012-07-30 ENCOUNTER — Inpatient Hospital Stay (HOSPITAL_COMMUNITY): Payer: BC Managed Care – PPO

## 2012-07-30 DIAGNOSIS — G9340 Encephalopathy, unspecified: Secondary | ICD-10-CM | POA: Diagnosis present

## 2012-07-30 DIAGNOSIS — D649 Anemia, unspecified: Secondary | ICD-10-CM | POA: Diagnosis not present

## 2012-07-30 LAB — GLUCOSE, CAPILLARY
Glucose-Capillary: 97 mg/dL (ref 70–99)
Glucose-Capillary: 102 mg/dL — ABNORMAL HIGH (ref 70–99)
Glucose-Capillary: 109 mg/dL — ABNORMAL HIGH (ref 70–99)

## 2012-07-30 LAB — CBC
HCT: 21.6 % — ABNORMAL LOW (ref 39.0–52.0)
MCH: 29 pg (ref 26.0–34.0)
MCV: 84.7 fL (ref 78.0–100.0)
RBC: 2.55 MIL/uL — ABNORMAL LOW (ref 4.22–5.81)
RDW: 13.5 % (ref 11.5–15.5)
WBC: 11 10*3/uL — ABNORMAL HIGH (ref 4.0–10.5)

## 2012-07-30 LAB — RENAL FUNCTION PANEL
BUN: 79 mg/dL — ABNORMAL HIGH (ref 6–23)
CO2: 25 mEq/L (ref 19–32)
Chloride: 100 mEq/L (ref 96–112)
Creatinine, Ser: 13.46 mg/dL — ABNORMAL HIGH (ref 0.50–1.35)

## 2012-07-30 LAB — HEPATIC FUNCTION PANEL
ALT: 102 U/L — ABNORMAL HIGH (ref 0–53)
AST: 148 U/L — ABNORMAL HIGH (ref 0–37)
Albumin: 2.1 g/dL — ABNORMAL LOW (ref 3.5–5.2)
Alkaline Phosphatase: 58 U/L (ref 39–117)
Total Bilirubin: 0.3 mg/dL (ref 0.3–1.2)

## 2012-07-30 MED ORDER — LORAZEPAM 0.5 MG PO TABS
0.5000 mg | ORAL_TABLET | Freq: Two times a day (BID) | ORAL | Status: DC
  Administered 2012-07-30: 0.5 mg via ORAL
  Filled 2012-07-30: qty 1

## 2012-07-30 MED ORDER — DEXMEDETOMIDINE HCL IN NACL 400 MCG/100ML IV SOLN
0.4000 ug/kg/h | INTRAVENOUS | Status: DC
  Administered 2012-07-30: 0.7 ug/kg/h via INTRAVENOUS
  Administered 2012-07-30: 1.1 ug/kg/h via INTRAVENOUS
  Administered 2012-07-30: 0.9 ug/kg/h via INTRAVENOUS
  Administered 2012-07-31 (×2): 1.2 ug/kg/h via INTRAVENOUS
  Administered 2012-07-31: 0.7 ug/kg/h via INTRAVENOUS
  Administered 2012-07-31 – 2012-08-01 (×5): 1.2 ug/kg/h via INTRAVENOUS
  Administered 2012-08-02: 0.8 ug/kg/h via INTRAVENOUS
  Administered 2012-08-02 (×2): 1.2 ug/kg/h via INTRAVENOUS
  Administered 2012-08-02: 1 ug/kg/h via INTRAVENOUS
  Administered 2012-08-02: 1.2 ug/kg/h via INTRAVENOUS
  Administered 2012-08-03: 0.748 ug/kg/h via INTRAVENOUS
  Administered 2012-08-03: 1.202 ug/kg/h via INTRAVENOUS
  Administered 2012-08-04: 1.2 ug/kg/h via INTRAVENOUS
  Administered 2012-08-04: 1.202 ug/kg/h via INTRAVENOUS
  Administered 2012-08-04 (×3): 1.2 ug/kg/h via INTRAVENOUS
  Administered 2012-08-05 (×2): 1.7 ug/kg/h via INTRAVENOUS
  Administered 2012-08-05: 1.2 ug/kg/h via INTRAVENOUS
  Administered 2012-08-05: 1.7 ug/kg/h via INTRAVENOUS
  Administered 2012-08-05: 1.2 ug/kg/h via INTRAVENOUS
  Administered 2012-08-05: 1.7 ug/kg/h via INTRAVENOUS
  Administered 2012-08-06: 1.1 ug/kg/h via INTRAVENOUS
  Administered 2012-08-06 (×2): 1.7 ug/kg/h via INTRAVENOUS
  Administered 2012-08-06: 1.5 ug/kg/h via INTRAVENOUS
  Administered 2012-08-06 (×2): 1.1 ug/kg/h via INTRAVENOUS
  Administered 2012-08-07: 1 ug/kg/h via INTRAVENOUS
  Administered 2012-08-07: 1.1 ug/kg/h via INTRAVENOUS
  Filled 2012-07-30 (×12): qty 100
  Filled 2012-07-30: qty 200
  Filled 2012-07-30 (×30): qty 100

## 2012-07-30 MED ORDER — NEPRO/CARBSTEADY PO LIQD
1000.0000 mL | ORAL | Status: DC
  Administered 2012-07-30 – 2012-08-04 (×2): 1000 mL
  Filled 2012-07-30 (×13): qty 1000

## 2012-07-30 MED ORDER — ALTEPLASE 100 MG IV SOLR
4.0000 mg | Freq: Once | INTRAVENOUS | Status: AC
  Administered 2012-07-30: 4 mg
  Filled 2012-07-30: qty 4

## 2012-07-30 MED ORDER — LORAZEPAM 1 MG PO TABS
1.0000 mg | ORAL_TABLET | Freq: Two times a day (BID) | ORAL | Status: DC
  Administered 2012-07-30 – 2012-08-01 (×3): 1 mg via ORAL
  Filled 2012-07-30 (×4): qty 1

## 2012-07-30 MED ORDER — RENA-VITE PO TABS
1.0000 | ORAL_TABLET | Freq: Every day | ORAL | Status: DC
  Administered 2012-07-30: 1 via ORAL
  Filled 2012-07-30 (×3): qty 1

## 2012-07-30 MED ORDER — CEFAZOLIN SODIUM 1-5 GM-% IV SOLN
1.0000 g | INTRAVENOUS | Status: AC
  Administered 2012-07-31: 1 g via INTRAVENOUS
  Filled 2012-07-30: qty 50

## 2012-07-30 MED ORDER — NEPRO/CARBSTEADY PO LIQD
1000.0000 mL | ORAL | Status: DC
  Filled 2012-07-30: qty 1000

## 2012-07-30 MED ORDER — QUETIAPINE FUMARATE 100 MG PO TABS
100.0000 mg | ORAL_TABLET | Freq: Two times a day (BID) | ORAL | Status: DC
  Administered 2012-07-30: 100 mg via ORAL
  Filled 2012-07-30 (×3): qty 1

## 2012-07-30 NOTE — Progress Notes (Signed)
Name: Martin Chapman MRN: [id number] DOB: Mar 14, 1991    LOS: 8  PULMONARY / CRITICAL CARE MEDICINE  HPI:  21 years old male with PMH relevant for heroin addiction, depression, anxiety and panic attacks. He overdosed on heroin and alcohol 10/19 PM  And 10/20 PM he was found unresponsive at about 2:00 pm. Pt now on vent, s/p compartment syndrome and release in OR of R shoulder muscles.   Events since admission: 10/20- Admitted to ICU 10/20 - Surgery with shoulder release and fasciotomy of right arm 10/21 - Started on CVVH>>d/c 10/22 10/22 - Return to OR for I&D with wound vac placement. Bicep excised  10/25 - Vac change 10/26 - diarrhea>>C diff positive 10/27 - extubated 10/27 - agitation, precedex started  Current Status: Int agitation when precedex lowered, cursing & yelling out 'i am kidnapped'  Vital Signs: Temp:  [99 F (37.2 C)-99.4 F (37.4 C)] 99.4 F (37.4 C) (10/28 0446) Pulse Rate:  [81-126] 82  (10/28 0700) Resp:  [5-20] 12  (10/28 0704) BP: (96-154)/(41-116) 110/54 mmHg (10/28 0704) SpO2:  [91 %-100 %] 100 % (10/28 0657) FiO2 (%):  [30 %] 30 % (10/27 1000) Weight:  [175 lb 14.8 oz (79.8 kg)-177 lb 4 oz (80.4 kg)] 175 lb 14.8 oz (79.8 kg) (10/28 0865)  Physical Examination: General - Asleep in bed, no distress HEENT - [place] in place Cardiac - s1s2 regular Chest - CTAB, no wheeze Abd - soft, nontender. Hypoactive BS Ext - Rt upper arm wrapped in ACE, wound vac not in place. Good pulses all extremities Neuro - Sedated, does not follow commands on my exam  Dg Chest Port 1 View  07/29/2012  *RADIOLOGY REPORT*  Clinical Data: Atelectasis.  PORTABLE CHEST - 1 VIEW  Comparison: 07/28/2012.  Findings: Endotracheal tube tip 1.9 cm above the carina.  Left central line tip mid superior vena cava level.  Nasogastric tube side hole just beyond the gastroesophageal junction.  No gross pneumothorax.  Mild central pulmonary vascular prominence.  No segmental  consolidation.  Oblique subsegmental atelectasis right midlung.  IMPRESSION: No significant change in the subsegmental atelectasis right midlung.   Original Report Authenticated By: Fuller Canada, M.D.     ASSESSMENT AND PLAN  PULMONARY No results found for this basename: PHART:5,PCO2:5,PCO2ART:5,PO2ART:5,HCO3:5,O2SAT:5 in the last 168 hours Ventilator Settings: Vent Mode:  [-]  FiO2 (%):  [30 %] 30 %  A:   1) Acute hypoxic respiratory failure likely secondary to aspiration pneumonia / pneumonitis P:   Extubated 10/27 with agitation Adjust oxygen to keep Spo2 > 92%   CARDIOVASCULAR  Lab 07/24/12 0425 07/23/12 1550  TROPONINI 1.91* 3.24*  LATICACIDVEN 2.4* --  PROBNP -- --    Lines:  Left IJ placed 07/22/12 >> R femoral HD cath 10/20>> Art line L radial 10/21>>10/24  Echo 10/22>>EF 55 to 60%  A:  1) Hypovolemic/septic shock>>off pressors 10/24. 2) Lactic acidosis likely secondary to shock and rhabdomyolysis>>resolved 3) Right upper extremity injury with compartment syndrome and possible vascular injury Note arch aortogram in OR normal.  No vascular deficit in RUE/shoulder Elevated troponin P:  Ck trending down Wound care per ortho and wound care team   RENAL  Lab 07/30/12 0500 07/29/12 0456 07/28/12 0453 07/27/12 0450 07/26/12 0743 07/24/12 0425  NA 138 138 143 142 142 --  K 4.7 4.0 -- -- -- --  CL 100 100 104 103 100 --  CO2 25 27 28 30 31  --  BUN 79* 59* 70* 52* 54* --  CREATININE 13.46* 10.41* 12.32* 9.57* 8.95* --  CALCIUM 8.7 8.6 8.2* 8.0* 7.7* --  MG -- -- -- 2.5 2.7* 1.9  PHOS 6.5* 5.0* 5.0* 3.4 3.4 --   Intake/Output      10/27 0701 - 10/28 0700 10/28 0701 - 10/29 0700   I.V. (mL/kg) 305.7 (3.8)    NG/GT 340    IV Piggyback     Total Intake(mL/kg) 645.7 (8.1)    Drains 200    Other  -185   Stool 100    Total Output 300 -185   Net +345.7 +185         Foley:  07/22/12  A:   1) Acute renal failure s/p CRRT>>transitioned to intermittent  HD 2) Rhabdomyolysis 3) Hyperkalemia>>>resolved P:   HD per renal; currently getting treatment 07/30/12 - permacath planned F/u Renal panel   GASTROINTESTINAL  Lab 07/30/12 0500 07/29/12 0456 07/28/12 0453 07/27/12 0450 07/26/12 0743  AST 148* 166* 253* 550* 690*  ALT 102* 116* 140* 200* 211*  ALKPHOS 58 70 66 84 85  BILITOT 0.3 0.3 0.4 0.4 0.7  PROT 5.0* 5.3* 4.7* 4.8* 4.5*  ALBUMIN 2.1*2.1* 2.2* 2.0* 2.2* 2.1*    A:   1) Elevated LFT's and INR likely shock liver 2) Nutrition 3) Diarrhea 10/26 from C diff P:   F/u LFT, trending down Continue tube feeds  HEMATOLOGIC  Lab 07/30/12 0500 07/29/12 0456 07/28/12 0453 07/27/12 0450 07/26/12 0430 07/24/12 0425  HGB 7.4* 8.5* 7.9* 7.8* 7.3* --  HCT 21.6* 25.0* 22.8* 22.8* 21.5* --  PLT 217 185 130* 120* 77* --  INR -- -- -- -- -- 1.65*  APTT -- -- -- -- -- 32   A:   1) Elevated INR likely from shock liver>>resolved 2) Anemia of critical illness 3) Thrombocytopenia>>improving P:  F/u CBC; trending down Check iron panel Transfuse for Hb < 7   INFECTIOUS  Lab 07/30/12 0500 07/29/12 0456 07/28/12 0453 07/27/12 0450 07/26/12 0430 07/25/12 0443 07/24/12 0425 07/23/12 1200  WBC 11.0* 9.1 9.4 7.5 7.5 -- -- --  PROCALCITON -- -- -- -- -- 20.19 29.45 31.40   Cultures: Blood culture 10/20>>ng Urine culture 10/20>> negative BCx 10/23>> NGTD UCX 10/23 >>negative C diff 10/26>>Positive  Antibiotics: Zosyn 10/20>>10/28 IV Vancomycin 10/20>>10/26 PO vancomycin 10/26>>   A:   1) Aspiration pneumonia. 2) Rt arm compartment syndrome 3) Recurrent fever>>10/23 4) Diarrhea 10/26 from C diff P:   D/c Zosyn (Day #8) Continue po vancomycin   ENDOCRINE  Lab 07/30/12 0410 07/30/12 0016 07/29/12 2016 07/29/12 1536 07/29/12 1127  GLUCAP 92 93 93 108* 103*   A:   1) Hyperglycemia>>improved; no Hx of DM P:   - POCT glucose q4hrs - IVF to [facility]   NEUROLOGIC  A:   1) Acute encephalopathy from OD; sedated on vent  P:    - precedex for agitation  - scheduled Ativan, as well as prn ativan as needed for agitation  - daily WUA - Sitter at bedside, as available, for agitation and suicide precautions   BEST PRACTICE / DISPOSITION - Level of Care:  ICU - Primary Service:  PCCM - Consultants:  Vascular surgery (signed off), orthopedic surgery, nephrology - Code Status:  Full code - Diet:  NPO - DVT Px:  SCD, Hep SQ - GI Px:  Protonix - Skin Integrity:  Intact - Social / Family:  No family at [employer] M. Hairford, M.D. 07/30/2012 8:13 AM   Care during the described time  interval was provided by me and/or other providers on the critical care team.  I have reviewed this patient's available data, including medical history, events of note, physical examination and test results as part of my evaluation  CC time x  32 m  Cyril Mourning MD. Tonny Bollman. [facility] Pager [phone] If no response call [phone]

## 2012-07-30 NOTE — Procedures (Signed)
I was present at this session.  I have reviewed the session itself and made appropriate changes.  TPA in Cath, will need to see results, consider PC  Kellon Chalk L 10/28/20138:28 AM

## 2012-07-30 NOTE — Progress Notes (Signed)
Nutrition Follow-up / Consult  Intervention:    Start Nepro via NG tube at 15 ml/h, increase by 10 ml every 4 hours to goal rate of 55 ml/h to provide 2376 kcals, 107 gm protein, 960 ml free water daily.  Assessment:   Discussed patient in rounds this morning.  Wound VAC is in place.  Patient is currently receiving HD and it is suspected that he will need this for the long term.  Patient is confused and disoriented.  Unable to safely take PO's at this time.  Received consult for TF initiation and management.  NG tube is in place.  Patient is no longer intubated, was extubated on 10/27.  Diet Order: NPO  Meds: Scheduled Meds:    . alteplase  4 mg Intracatheter Once  . Chlorhexidine Gluconate Cloth  6 each Topical Q0600  . darbepoetin (ARANESP) injection - DIALYSIS  150 mcg Intravenous Q Sat-HD  . famotidine (PEPCID) IV  20 mg Intravenous Q12H  . ferric gluconate (FERRLECIT/NULECIT) IV  125 mg Intravenous Q T,Th,Sa-HD  . heparin subcutaneous  5,000 Units Subcutaneous Q8H  . multivitamin  1 tablet Oral Daily  . piperacillin-tazobactam (ZOSYN)  IV  2.25 g Intravenous Q8H  . saccharomyces boulardii  250 mg Oral BID  . vancomycin  500 mg Per Tube Q6H  . DISCONTD: antiseptic oral rinse  15 mL Mouth Rinse QID  . DISCONTD: chlorhexidine  15 mL Mouth Rinse BID  . DISCONTD: feeding supplement  30 mL Per Tube TID   Continuous Infusions:    . dexmedetomidine    . dextrose 5 % and 0.9% NaCl 10 mL/hr at 07/29/12 0914  . DISCONTD: dexmedetomidine 0.7 mcg/kg/hr (07/30/12 0857)  . DISCONTD: feeding supplement (OSMOLITE 1.5 CAL) 1,000 mL (07/28/12 1439)   PRN Meds:.sodium chloride, sodium chloride, sodium chloride, feeding supplement (NEPRO CARB STEADY), fentaNYL, heparin, heparin, heparin, heparin, lidocaine, lidocaine-prilocaine, LORazepam, ondansetron, pentafluoroprop-tetrafluoroeth  Labs:  CMP     Component Value Date/Time   NA 138 07/30/2012 0500   K 4.7 07/30/2012 0500   CL 100  07/30/2012 0500   CO2 25 07/30/2012 0500   GLUCOSE 97 07/30/2012 0500   BUN 79* 07/30/2012 0500   CREATININE 13.46* 07/30/2012 0500   CALCIUM 8.7 07/30/2012 0500   PROT 5.0* 07/30/2012 0500   ALBUMIN 2.1* 07/30/2012 0500   ALBUMIN 2.1* 07/30/2012 0500   AST 148* 07/30/2012 0500   ALT 102* 07/30/2012 0500   ALKPHOS 58 07/30/2012 0500   BILITOT 0.3 07/30/2012 0500   GFRNONAA 5* 07/30/2012 0500   GFRAA 5* 07/30/2012 0500    Sodium  Date/Time Value Range Status  07/30/2012  5:00 AM 138  135 - 145 mEq/L Final  07/29/2012  4:56 AM 138  135 - 145 mEq/L Final  07/28/2012  4:53 AM 143  135 - 145 mEq/L Final    Potassium  Date/Time Value Range Status  07/30/2012  5:00 AM 4.7  3.5 - 5.1 mEq/L Final  07/29/2012  4:56 AM 4.0  3.5 - 5.1 mEq/L Final  07/28/2012  4:53 AM 3.8  3.5 - 5.1 mEq/L Final    Phosphorus  Date/Time Value Range Status  07/30/2012  5:00 AM 6.5* 2.3 - 4.6 mg/dL Final  56/21/3086  5:78 AM 5.0* 2.3 - 4.6 mg/dL Final  46/96/2952  8:41 AM 5.0* 2.3 - 4.6 mg/dL Final    Magnesium  Date/Time Value Range Status  07/27/2012  4:50 AM 2.5  1.5 - 2.5 mg/dL Final  32/44/0102  7:25 AM 2.7* 1.5 -  2.5 mg/dL Final  40/98/1191  4:78 AM 1.9  1.5 - 2.5 mg/dL Final    CBG (last 3)   Basename 07/30/12 0827 07/30/12 0410 07/30/12 0016  GLUCAP 100* 92 93      Intake/Output Summary (Last 24 hours) at 07/30/12 1117 Last data filed at 07/30/12 0900  Gross per 24 hour  Intake 327.25 ml  Output    -85 ml  Net 412.25 ml    Weight Status:  80.2 kg down from 83.9 kg on 10/23; fluctuating with fluids  Re-estimated needs:  2200-2400 kcals, 96-112 gm protein, ~1.2 liters fluid daily  Nutrition Dx:  Inadequate oral intake r/t inability to eat AEB NPO status.  Goal:  Intake to meet >90% of estimated nutrition needs.  Monitor:  TF tolerance/adequacy, weight trend, labs, I/O   Joaquin Courts, RD, LDN, CNSC Pager# [phone] After Hours Pager# [phone]

## 2012-07-30 NOTE — Consult Note (Signed)
[facility] [place] CONSULT NOTE 07/30/2012 DOB: [birth year] MRN : [id number]  CC: ESRD Referring Physician: Trevor Iha, MD  History of Present Illness: Martin Chapman is a 21 y.o. male with a past history of heroin addiction, depression, anxiety and panic attacks. Brought to ER yesterday by his friends (around [facility]) after found unresponsive overdosed on heroin and alcohol the night before. On 1020 had fasciotomy and angiography (75mL contrast volume) by Dr Lajoyce Corners for right arm compartment syndrome with a right peri clavicular hematoma. Dr Darrick Penna placed a temporary right femoral HD catheter at that time. Pt creatinine has been climbing over the past several days - 13.46 today. We were asked to place a diatek tunneled catheter.     Past Medical History  Diagnosis Date  . Anxiety   . Panic attacks   . Depression     Past Surgical History  Procedure Date  . Extraction of wisdom teeth   . Intraoperative arteriogram 07/22/2012    Procedure: INTRA OPERATIVE ARTERIOGRAM;  Surgeon: Sherren Kerns, MD;  Location: [facility] OR;  Service: Vascular;  Laterality: Left;  arch aortagram, second order catheterization right subclavian artery, ultrasound left groin  . Insertion of dialysis catheter 07/22/2012    Procedure: INSERTION OF DIALYSIS CATHETER;  Surgeon: Sherren Kerns, MD;  Location: [facility] OR;  Service: Vascular;  Laterality: Right;  . Fasciotomy 07/22/2012    Procedure: FASCIOTOMY;  Surgeon: Nadara Mustard, MD;  Location: [facility] OR;  Service: Orthopedics;  Laterality: Right;  . I&d extremity 07/24/2012    Procedure: IRRIGATION AND DEBRIDEMENT EXTREMITY;  Surgeon: Nadara Mustard, MD;  Location: [facility] OR;  Service: Orthopedics;  Laterality: Right;  Right Arm Irrigation and Debridement, VAC Application  . Application of wound vac 07/24/2012    Procedure: APPLICATION OF WOUND VAC;  Surgeon: Nadara Mustard, MD;  Location: [facility] OR;  Service: Orthopedics;  Laterality: Right;     ROS:  [x]  Positive  [ ]  Denies    General: [ ]  Weight loss, [ ]  Fever, [ ]  chills Neurologic: [ ]  Dizziness, [ ]  Blackouts, [ ]  Seizure [ ]  Stroke, [ ]  "Mini stroke", [ ]  Slurred speech, [ ]  Temporary blindness; [ ]  weakness in arms or legs, [ ]  Hoarseness Cardiac: [ ]  Chest pain/pressure, [ ]  Shortness of breath at rest [ ]  Shortness of breath with exertion, [ ]  Atrial fibrillation or irregular heartbeat Vascular: [ ]  Pain in legs with walking, [ ]  Pain in legs at rest, [ ]  Pain in legs at night,  [ ]  Non-healing ulcer, [ ]  Blood clot in vein/DVT,   Pulmonary: [ ]  Home oxygen, [ ]  Productive cough, [ ]  Coughing up blood, [ ]  Asthma,  [ ]  Wheezing Musculoskeletal:  [ ]  Arthritis, [ ]  Low back pain, [ ]  Joint pain Hematologic: [ ]  Easy Bruising, [ ]  Anemia; [ ]  Hepatitis Gastrointestinal: [ ]  Blood in stool, [ ]  Gastroesophageal Reflux/heartburn, [ ]  Trouble swallowing Urinary: [ ]  chronic Kidney disease, [ ]  on HD - [ ]  MWF or [ ]  TTHS, [ ]  Burning with urination, [ ]  Difficulty urinating Skin: [ ]  Rashes, [ ]  Wounds Psychological: [x ] Anxiety, [x ] Depression  Social History History  Substance Use Topics  . Smoking status: Current Every Day Smoker -- 1.0 packs/day    Types: Cigarettes  . Smokeless tobacco: Not on file  . Alcohol Use: Yes     rare    Family History Family History  Problem Relation Age of Onset  . Hypertension Father     No Known Allergies  Current Facility-Administered Medications  Medication Dose Route Frequency Provider Last Rate Last Dose  . 0.9 %  sodium chloride infusion  250 mL Intravenous PRN Coralyn Helling, MD 10 mL/hr at 07/27/12 0454 250 mL at 07/27/12 0454  . 0.9 %  sodium chloride infusion  100 mL Intravenous PRN Zada Girt, MD      . 0.9 %  sodium chloride infusion  100 mL Intravenous PRN Zada Girt, MD      . alteplase (ACTIVASE) injection 4 mg  4 mg Intracatheter Once Trevor Iha, MD   4 mg at 07/30/12 0750  . Chlorhexidine Gluconate Cloth  2 % PADS 6 each  6 each Topical Q0600 Coralyn Helling, MD   6 each at 07/30/12 0600  . darbepoetin (ARANESP) injection 150 mcg  150 mcg Intravenous Q Sat-HD Zada Girt, MD   150 mcg at 07/28/12 1026  . dexmedetomidine (PRECEDEX) 400 mcg / 100 mL infusion  0.4-1.2 mcg/kg/hr Intravenous Titrated Nelda Bucks, MD 18 mL/hr at 07/30/12 1508 0.9 mcg/kg/hr at 07/30/12 1508  . dextrose 5 %-0.9 % sodium chloride infusion   Intravenous Continuous Zada Girt, MD 10 mL/hr at 07/29/12 0914    . famotidine (PEPCID) IVPB 20 mg  20 mg Intravenous Q12H Coralyn Helling, MD   20 mg at 07/30/12 1402  . feeding supplement (NEPRO CARB STEADY) liquid 1,000 mL  1,000 mL Per Tube Continuous Hettie Holstein, RD      . feeding supplement (NEPRO CARB STEADY) liquid 237 mL  237 mL Oral PRN Zada Girt, MD      . fentaNYL (SUBLIMAZE) injection 25-50 mcg  25-50 mcg Intravenous Q2H PRN Coralyn Helling, MD      . ferric gluconate (NULECIT) 125 mg in sodium chloride 0.9 % 100 mL IVPB  125 mg Intravenous Q T,Th,Sa-HD Zada Girt, MD   125 mg at 07/28/12 1054  . heparin injection 1,000 Units  1,000 Units Dialysis PRN Zada Girt, MD   1,000 Units at 07/30/12 1245  . heparin injection 1,600 Units  20 Units/kg Dialysis PRN Zada Girt, MD   1,600 Units at 07/28/12 0700  . heparin injection 1,600 Units  20 Units/kg Dialysis PRN Zada Girt, MD      . heparin injection 1,600 Units  20 Units/kg Dialysis PRN Zada Girt, MD   1,600 Units at 07/30/12 0931  . heparin injection 5,000 Units  5,000 Units Subcutaneous Q8H Lonia Skinner, MD   5,000 Units at 07/30/12 1403  . lidocaine (XYLOCAINE) 1 % injection 5 mL  5 mL Intradermal PRN Zada Girt, MD      . lidocaine-prilocaine (EMLA) cream 1 application  1 application Topical PRN Zada Girt, MD      . LORazepam (ATIVAN) injection 1-2 mg  1-2 mg Intravenous Q1H PRN Coralyn Helling, MD   2 mg at 07/30/12 1120  . LORazepam (ATIVAN) tablet 1 mg  1 mg Oral BID Amber Nydia Bouton, MD      . multivitamin (RENA-VIT) tablet 1 tablet  1 tablet Oral Daily Trevor Iha, MD   1 tablet at 07/30/12 1401  . ondansetron (ZOFRAN) injection 4 mg  4 mg Intravenous Q6H PRN Sherren Kerns, MD      . pentafluoroprop-tetrafluoroeth Peggye Pitt) aerosol 1 application  1 application Topical PRN Zada Girt, MD      .  QUEtiapine (SEROQUEL) tablet 100 mg  100 mg Oral BID Amber Nydia Bouton, MD      . saccharomyces boulardii (FLORASTOR) capsule 250 mg  250 mg Oral BID Coralyn Helling, MD   250 mg at 07/30/12 1401  . vancomycin (VANCOCIN) 50 mg/mL oral solution 500 mg  500 mg Per Tube Q6H Coralyn Helling, MD   500 mg at 07/30/12 1402  . DISCONTD: dexmedetomidine (PRECEDEX) 200 mcg / 50 mL infusion  0.2-0.7 mcg/kg/hr Intravenous Continuous Coralyn Helling, MD 14.2 mL/hr at 07/30/12 0857 0.7 mcg/kg/hr at 07/30/12 0857  . DISCONTD: feeding supplement (NEPRO CARB STEADY) liquid 1,000 mL  1,000 mL Per Tube Continuous Lonia Skinner, MD      . DISCONTD: LORazepam (ATIVAN) tablet 0.5 mg  0.5 mg Oral BID Hilarie Fredrickson, MD   0.5 mg at 07/30/12 1401  . DISCONTD: piperacillin-tazobactam (ZOSYN) IVPB 2.25 g  2.25 g Intravenous Q8H Lauren Bajbus, PHARMD   2.25 g at 07/29/12 2111     Imaging: Dg Chest Port 1 View  07/29/2012  *RADIOLOGY REPORT*  Clinical Data: Atelectasis.  PORTABLE CHEST - 1 VIEW  Comparison: 07/28/2012.  Findings: Endotracheal tube tip 1.9 cm above the carina.  Left central line tip mid superior vena cava level.  Nasogastric tube side hole just beyond the gastroesophageal junction.  No gross pneumothorax.  Mild central pulmonary vascular prominence.  No segmental consolidation.  Oblique subsegmental atelectasis right midlung.  IMPRESSION: No significant change in the subsegmental atelectasis right midlung.   Original Report Authenticated By: Fuller Canada, M.D.     Significant Diagnostic Studies: CBC Lab Results  Component Value Date   WBC 11.0* 07/30/2012   HGB 7.4* 07/30/2012    HCT 21.6* 07/30/2012   MCV 84.7 07/30/2012   PLT 217 07/30/2012    BMET    Component Value Date/Time   NA 138 07/30/2012 0500   K 4.7 07/30/2012 0500   CL 100 07/30/2012 0500   CO2 25 07/30/2012 0500   GLUCOSE 97 07/30/2012 0500   BUN 79* 07/30/2012 0500   CREATININE 13.46* 07/30/2012 0500   CALCIUM 8.7 07/30/2012 0500   GFRNONAA 5* 07/30/2012 0500   GFRAA 5* 07/30/2012 0500    COAG Lab Results  Component Value Date   INR 1.65* 07/24/2012   INR 1.64* 07/23/2012   INR 2.39* 07/23/2012   No results found for this basename: PTT     Physical Examination BP Readings from Last 3 Encounters:  07/30/12 102/51  07/30/12 102/51  07/30/12 102/51   Temp Readings from Last 3 Encounters:  07/30/12 98 F (36.7 C) Oral  07/30/12 98 F (36.7 C) Oral  07/30/12 98 F (36.7 C) Oral   SpO2 Readings from Last 3 Encounters:  07/30/12 99%  07/30/12 99%  07/30/12 99%   Pulse Readings from Last 3 Encounters:  07/30/12 81  07/30/12 81  07/30/12 81    General:  WDWN male with confusion and paranoia Pulmonary: normal non-labored breathing , without Rales, rhonchi,  wheezing Cardiac: RRR, without  Murmurs, rubs or gallops; Abdomen: soft, NT, no masses Skin: no rashes, ulcers noted Vascular Exam/Pulses:  Extremities without ischemic changes, no Gangrene , no cellulitis; no open wounds;  Musculoskeletal: no muscle wasting or atrophy  Neurologic: confusion  ASSESSMENT/PLAN: Martin Chapman is a 21 y.o. male with acute ESRD. Pt needs HD catheter placed. Temp femoral catheter not working well. Will plan catheter tomorrow

## 2012-07-30 NOTE — Consult Note (Signed)
Wound care follow-up:  Pt was agitated this weekend and pulled off vac dressing, according to progress notes.  Discussed plan of care with Dr Lajoyce Corners.  Re-applied dressing using Mepitel over wound bed and 2 pieces black sponge over wound.  Beefy red, large amt pink drainage, no odor.  Sutures intact above and below wound, generalized edema. Tolerated with moderate discomfort.  Very agitated and combative.  Applied ace wrap over dressing to avoid pt pulling off again. Plan dressing change Wed.  Cont suction on at 75mm.     Cammie Mcgee, RN, MSN, [employer]  [phone]

## 2012-07-30 NOTE — Progress Notes (Signed)
Patient remains not appropriate to participate in psychosocial assessment which would include substance abuse assessment. Will continue to follow.  Carney Bern, LCSWA  07/30/2012 2:39 PM

## 2012-07-30 NOTE — Progress Notes (Deleted)
[facility] [place] CONSULT NOTE 07/30/2012 DOB: 04/27/1991 MRN : [id number]  CC: ESRD Referring Physician: James L Deterding, MD  History of Present Illness: Martin Chapman is a 21 y.o. male with a past history of heroin addiction, depression, anxiety and panic attacks. Brought to ER yesterday by his friends (around 2PM) after found unresponsive overdosed on heroin and alcohol the night before. On 1020 had fasciotomy and angiography (75mL contrast volume) by Dr Duda for right arm compartment syndrome with a right peri clavicular hematoma. Dr Fields placed a temporary right femoral HD catheter at that time. Pt creatinine has been climbing over the past several days - 13.46 today. We were asked to place a diatek tunneled catheter.     Past Medical History  Diagnosis Date  . Anxiety   . Panic attacks   . Depression     Past Surgical History  Procedure Date  . Extraction of wisdom teeth   . Intraoperative arteriogram 07/22/2012    Procedure: INTRA OPERATIVE ARTERIOGRAM;  Surgeon: Charles E Fields, MD;  Location: [facility] OR;  Service: Vascular;  Laterality: Left;  arch aortagram, second order catheterization right subclavian artery, ultrasound left groin  . Insertion of dialysis catheter 07/22/2012    Procedure: INSERTION OF DIALYSIS CATHETER;  Surgeon: Charles E Fields, MD;  Location: [facility] OR;  Service: Vascular;  Laterality: Right;  . Fasciotomy 07/22/2012    Procedure: FASCIOTOMY;  Surgeon: Marcus V Duda, MD;  Location: [facility] OR;  Service: Orthopedics;  Laterality: Right;  . I&d extremity 07/24/2012    Procedure: IRRIGATION AND DEBRIDEMENT EXTREMITY;  Surgeon: Marcus V Duda, MD;  Location: [facility] OR;  Service: Orthopedics;  Laterality: Right;  Right Arm Irrigation and Debridement, VAC Application  . Application of wound vac 07/24/2012    Procedure: APPLICATION OF WOUND VAC;  Surgeon: Marcus V Duda, MD;  Location: [facility] OR;  Service: Orthopedics;  Laterality: Right;     ROS:  [x] Positive  [ ] Denies    General: [ ] Weight loss, [ ] Fever, [ ] chills Neurologic: [ ] Dizziness, [ ] Blackouts, [ ] Seizure [ ] Stroke, [ ] "Mini stroke", [ ] Slurred speech, [ ] Temporary blindness; [ ] weakness in arms or legs, [ ] Hoarseness Cardiac: [ ] Chest pain/pressure, [ ] Shortness of breath at rest [ ] Shortness of breath with exertion, [ ] Atrial fibrillation or irregular heartbeat Vascular: [ ] Pain in legs with walking, [ ] Pain in legs at rest, [ ] Pain in legs at night,  [ ] Non-healing ulcer, [ ] Blood clot in vein/DVT,   Pulmonary: [ ] Home oxygen, [ ] Productive cough, [ ] Coughing up blood, [ ] Asthma,  [ ] Wheezing Musculoskeletal:  [ ] Arthritis, [ ] Low back pain, [ ] Joint pain Hematologic: [ ] Easy Bruising, [ ] Anemia; [ ] Hepatitis Gastrointestinal: [ ] Blood in stool, [ ] Gastroesophageal Reflux/heartburn, [ ] Trouble swallowing Urinary: [ ] chronic Kidney disease, [ ] on HD - [ ] MWF or [ ] TTHS, [ ] Burning with urination, [ ] Difficulty urinating Skin: [ ] Rashes, [ ] Wounds Psychological: [x ] Anxiety, [x ] Depression  Social History History  Substance Use Topics  . Smoking status: Current Every Day Smoker -- 1.0 packs/day    Types: Cigarettes  . Smokeless tobacco: Not on file  . Alcohol Use: Yes     rare    Family History Family History    Problem Relation Age of Onset  . Hypertension Father     No Known Allergies  Current Facility-Administered Medications  Medication Dose Route Frequency Provider Last Rate Last Dose  . 0.9 %  sodium chloride infusion  250 mL Intravenous PRN Vineet Sood, MD 10 mL/hr at 07/27/12 0454 250 mL at 07/27/12 0454  . 0.9 %  sodium chloride infusion  100 mL Intravenous PRN Richard F Fox, MD      . 0.9 %  sodium chloride infusion  100 mL Intravenous PRN Richard F Fox, MD      . alteplase (ACTIVASE) injection 4 mg  4 mg Intracatheter Once James L Deterding, MD   4 mg at 07/30/12 0750  . Chlorhexidine Gluconate Cloth  2 % PADS 6 each  6 each Topical Q0600 Vineet Sood, MD   6 each at 07/30/12 0600  . darbepoetin (ARANESP) injection 150 mcg  150 mcg Intravenous Q Sat-HD Richard F Fox, MD   150 mcg at 07/28/12 1026  . dexmedetomidine (PRECEDEX) 400 mcg / 100 mL infusion  0.4-1.2 mcg/kg/hr Intravenous Titrated Daniel J Feinstein, MD 18 mL/hr at 07/30/12 1508 0.9 mcg/kg/hr at 07/30/12 1508  . dextrose 5 %-0.9 % sodium chloride infusion   Intravenous Continuous Richard F Fox, MD 10 mL/hr at 07/29/12 0914    . famotidine (PEPCID) IVPB 20 mg  20 mg Intravenous Q12H Vineet Sood, MD   20 mg at 07/30/12 1402  . feeding supplement (NEPRO CARB STEADY) liquid 1,000 mL  1,000 mL Per Tube Continuous Kimberly Alverson Harris, RD      . feeding supplement (NEPRO CARB STEADY) liquid 237 mL  237 mL Oral PRN Richard F Fox, MD      . fentaNYL (SUBLIMAZE) injection 25-50 mcg  25-50 mcg Intravenous Q2H PRN Vineet Sood, MD      . ferric gluconate (NULECIT) 125 mg in sodium chloride 0.9 % 100 mL IVPB  125 mg Intravenous Q T,Th,Sa-HD Richard F Fox, MD   125 mg at 07/28/12 1054  . heparin injection 1,000 Units  1,000 Units Dialysis PRN Richard F Fox, MD   1,000 Units at 07/30/12 1245  . heparin injection 1,600 Units  20 Units/kg Dialysis PRN Richard F Fox, MD   1,600 Units at 07/28/12 0700  . heparin injection 1,600 Units  20 Units/kg Dialysis PRN Richard F Fox, MD      . heparin injection 1,600 Units  20 Units/kg Dialysis PRN Richard F Fox, MD   1,600 Units at 07/30/12 0931  . heparin injection 5,000 Units  5,000 Units Subcutaneous Q8H Stephanie E Losq, MD   5,000 Units at 07/30/12 1403  . lidocaine (XYLOCAINE) 1 % injection 5 mL  5 mL Intradermal PRN Richard F Fox, MD      . lidocaine-prilocaine (EMLA) cream 1 application  1 application Topical PRN Richard F Fox, MD      . LORazepam (ATIVAN) injection 1-2 mg  1-2 mg Intravenous Q1H PRN Vineet Sood, MD   2 mg at 07/30/12 1120  . LORazepam (ATIVAN) tablet 1 mg  1 mg Oral BID Amber M  Hairford, MD      . multivitamin (RENA-VIT) tablet 1 tablet  1 tablet Oral Daily James L Deterding, MD   1 tablet at 07/30/12 1401  . ondansetron (ZOFRAN) injection 4 mg  4 mg Intravenous Q6H PRN Charles E Fields, MD      . pentafluoroprop-tetrafluoroeth (GEBAUERS) aerosol 1 application  1 application Topical PRN Richard F Fox, MD      .   QUEtiapine (SEROQUEL) tablet 100 mg  100 mg Oral BID Amber M Hairford, MD      . saccharomyces boulardii (FLORASTOR) capsule 250 mg  250 mg Oral BID Vineet Sood, MD   250 mg at 07/30/12 1401  . vancomycin (VANCOCIN) 50 mg/mL oral solution 500 mg  500 mg Per Tube Q6H Vineet Sood, MD   500 mg at 07/30/12 1402  . DISCONTD: dexmedetomidine (PRECEDEX) 200 mcg / 50 mL infusion  0.2-0.7 mcg/kg/hr Intravenous Continuous Vineet Sood, MD 14.2 mL/hr at 07/30/12 0857 0.7 mcg/kg/hr at 07/30/12 0857  . DISCONTD: feeding supplement (NEPRO CARB STEADY) liquid 1,000 mL  1,000 mL Per Tube Continuous Stephanie E Losq, MD      . DISCONTD: LORazepam (ATIVAN) tablet 0.5 mg  0.5 mg Oral BID Amber M Hairford, MD   0.5 mg at 07/30/12 1401  . DISCONTD: piperacillin-tazobactam (ZOSYN) IVPB 2.25 g  2.25 g Intravenous Q8H Lauren Bajbus, PHARMD   2.25 g at 07/29/12 2111     Imaging: Dg Chest Port 1 View  07/29/2012  *RADIOLOGY REPORT*  Clinical Data: Atelectasis.  PORTABLE CHEST - 1 VIEW  Comparison: 07/28/2012.  Findings: Endotracheal tube tip 1.9 cm above the carina.  Left central line tip mid superior vena cava level.  Nasogastric tube side hole just beyond the gastroesophageal junction.  No gross pneumothorax.  Mild central pulmonary vascular prominence.  No segmental consolidation.  Oblique subsegmental atelectasis right midlung.  IMPRESSION: No significant change in the subsegmental atelectasis right midlung.   Original Report Authenticated By: STEVEN R. OLSON, M.D.     Significant Diagnostic Studies: CBC Lab Results  Component Value Date   WBC 11.0* 07/30/2012   HGB 7.4* 07/30/2012    HCT 21.6* 07/30/2012   MCV 84.7 07/30/2012   PLT 217 07/30/2012    BMET    Component Value Date/Time   NA 138 07/30/2012 0500   K 4.7 07/30/2012 0500   CL 100 07/30/2012 0500   CO2 25 07/30/2012 0500   GLUCOSE 97 07/30/2012 0500   BUN 79* 07/30/2012 0500   CREATININE 13.46* 07/30/2012 0500   CALCIUM 8.7 07/30/2012 0500   GFRNONAA 5* 07/30/2012 0500   GFRAA 5* 07/30/2012 0500    COAG Lab Results  Component Value Date   INR 1.65* 07/24/2012   INR 1.64* 07/23/2012   INR 2.39* 07/23/2012   No results found for this basename: PTT     Physical Examination BP Readings from Last 3 Encounters:  07/30/12 102/51  07/30/12 102/51  07/30/12 102/51   Temp Readings from Last 3 Encounters:  07/30/12 98 F (36.7 C) Oral  07/30/12 98 F (36.7 C) Oral  07/30/12 98 F (36.7 C) Oral   SpO2 Readings from Last 3 Encounters:  07/30/12 99%  07/30/12 99%  07/30/12 99%   Pulse Readings from Last 3 Encounters:  07/30/12 81  07/30/12 81  07/30/12 81    General:  WDWN male with confusion and paranoia Pulmonary: normal non-labored breathing , without Rales, rhonchi,  wheezing Cardiac: RRR, without  Murmurs, rubs or gallops; Abdomen: soft, NT, no masses Skin: no rashes, ulcers noted Vascular Exam/Pulses:  Extremities without ischemic changes, no Gangrene , no cellulitis; no open wounds;  Musculoskeletal: no muscle wasting or atrophy  Neurologic: confusion  ASSESSMENT/PLAN: Haydon G Chambers is a 21 y.o. male with acute ESRD. Pt needs HD catheter placed. Temp femoral catheter not working well. Will plan catheter tomorrow   

## 2012-07-30 NOTE — Consult Note (Signed)
Known to our service from placement of temporary catheter by Dr. fields at the time of his admission. For permanent catheter placement tomorrow I have examined the patient, reviewed and agree with above.  Jaleesa Cervi, MD 07/30/2012 4:52 PM

## 2012-07-30 NOTE — Progress Notes (Signed)
SLP Cancellation Note  Patient Details Name: Martin Chapman MRN: [id number] DOB: August 16, 1991   Cancelled treatment:        Order received for Swallow Evaluation.  Patient is currently sedated due to agitation, and is also undergoing dialysis.  Patient is NPO but has an NG tube.  SLP will return later today.   Maryjo Rochester T 07/30/2012, 9:42 AM

## 2012-07-30 NOTE — Evaluation (Signed)
Clinical/Bedside Swallow Evaluation Patient Details  Name: Martin Chapman MRN: [id number] Date of Birth: [birth year]  Today's Date: 07/30/2012 Time: 1400-1447 SLP Time Calculation (min): 47 min  Past Medical History:  Past Medical History  Diagnosis Date  . Anxiety   . Panic attacks   . Depression    Past Surgical History:  Past Surgical History  Procedure Date  . Extraction of wisdom teeth   . Intraoperative arteriogram 07/22/2012    Procedure: INTRA OPERATIVE ARTERIOGRAM;  Surgeon: Sherren Kerns, MD;  Location: [facility] OR;  Service: Vascular;  Laterality: Left;  arch aortagram, second order catheterization right subclavian artery, ultrasound left groin  . Insertion of dialysis catheter 07/22/2012    Procedure: INSERTION OF DIALYSIS CATHETER;  Surgeon: Sherren Kerns, MD;  Location: [facility] OR;  Service: Vascular;  Laterality: Right;  . Fasciotomy 07/22/2012    Procedure: FASCIOTOMY;  Surgeon: Nadara Mustard, MD;  Location: [facility] OR;  Service: Orthopedics;  Laterality: Right;  . I&d extremity 07/24/2012    Procedure: IRRIGATION AND DEBRIDEMENT EXTREMITY;  Surgeon: Nadara Mustard, MD;  Location: [facility] OR;  Service: Orthopedics;  Laterality: Right;  Right Arm Irrigation and Debridement, VAC Application  . Application of wound vac 07/24/2012    Procedure: APPLICATION OF WOUND VAC;  Surgeon: Nadara Mustard, MD;  Location: [facility] OR;  Service: Orthopedics;  Laterality: Right;   HPI:  21 y.o. admitted with heroin and alcohol overdose.  Vomit noted to be in oral cavity; aspiration pneumonitis.   Assessment / Plan / Recommendation Clinical Impression  Patient is currently confused, disoriented, combative, and impulsive.  Continues to need sedation, which increases his aspiration risks.        Aspiration Risk  Mild    Diet Recommendation Ice chips PRN after oral care (When alert.)   Liquid Administration via: Spoon Medication Administration: Via alternative means Supervision: Full  supervision/cueing for compensatory strategies Postural Changes and/or Swallow Maneuvers: Seated upright 90 degrees    Other  Recommendations Oral Care Recommendations: Oral care QID;Oral care before and after PO;Staff/trained caregiver to provide oral care Other Recommendations: Have oral suction available   Follow Up Recommendations  24 hour supervision/assistance (Inpatient drug rehab.)    Frequency and Duration min 3x week  2 weeks   Pertinent Vitals/Pain n/a    SLP Swallow Goals Goal #3: Patient will tolerate trials of sips/chips without s/s of aspiration,  after oral care when alert and able to cooperate   Swallow Study Prior Functional Status       General HPI: 21 y.o. admitted with heroin and alcohol overdose.  Vomit noted to be in oral cavity; aspiration pneumonitis. Type of Study: Bedside swallow evaluation Previous Swallow Assessment: N/A Diet Prior to this Study: NPO (NG tube) Temperature Spikes Noted: No Respiratory Status: Supplemental O2 delivered via (comment) History of Recent Intubation: Yes Length of Intubations (days): 6 days Date extubated: 07/29/12 Behavior/Cognition: Agitated;Confused;Impulsive;Uncooperative;Lethargic;Distractible;Requires cueing;Doesn't follow directions;Decreased sustained attention Oral Cavity - Dentition: Adequate natural dentition Self-Feeding Abilities: Needs assist Patient Positioning: Upright in bed Baseline Vocal Quality: Clear Volitional Cough: Cognitively unable to elicit Volitional Swallow: Unable to elicit    Oral/Motor/Sensory Function Overall Oral Motor/Sensory Function: Appears within functional limits for tasks assessed   Ice Chips Ice chips: Within functional limits Presentation: Spoon   Thin Liquid Thin Liquid: Impaired Presentation: Spoon;Straw Pharyngeal  Phase Impairments: Cough - Delayed (Coughed 1/4 trials)    Nectar Thick Nectar Thick Liquid: Not tested   Honey Thick Honey Thick Liquid: Not tested  Puree  Puree: Impaired Presentation: Spoon Oral Phase Impairments:  (Pt. spat bolus out.)   Solid   GO    Solid: Not tested (Patient refused.)       Maryjo Rochester T 07/30/2012,3:16 PM

## 2012-07-30 NOTE — Progress Notes (Signed)
07/30/12 1700  Clinical Encounter Type  Visited With Family  Visit Type Follow-up   Stopped by to see the patient. Spoke to his father. I will follow up with the family again.

## 2012-07-30 NOTE — Progress Notes (Signed)
Patients right femoral cath. Accessed this am for HD tx. Arterial and Venous ports very sluggish to aspiration. Arterial pressures -180 with BFR of 100. Lines reversed without success. Patients blood rinsed back and TPA to arterial 1.4cc and venous 1.4cc cath at 0750. Awaiting further instructions from Dr Darrick Penna. Macy Mis RN

## 2012-07-30 NOTE — Progress Notes (Signed)
Subjective: Interval History: has complaints kidnapping.  Objective: Vital signs in last 24 hours: Temp:  [99 F (37.2 C)-99.4 F (37.4 C)] 99.4 F (37.4 C) (10/28 0446) Pulse Rate:  [81-126] 82  (10/28 0700) Resp:  [5-20] 12  (10/28 0704) BP: (96-154)/(41-116) 110/54 mmHg (10/28 0704) SpO2:  [91 %-100 %] 100 % (10/28 0657) FiO2 (%):  [30 %] 30 % (10/27 1000) Weight:  [79.8 kg (175 lb 14.8 oz)-80.4 kg (177 lb 4 oz)] 79.8 kg (175 lb 14.8 oz) (10/28 0620) Weight change: 1.2 kg (2 lb 10.3 oz)  Intake/Output from previous day: 10/27 0701 - 10/28 0700 In: 645.7 [I.V.:305.7; NG/GT:340] Out: 300 [Drains:200; Stool:100] Intake/Output this shift: Total I/O In: -  Out: -185   General appearance: delirious Resp: diminished breath sounds bilaterally Cardio: regular rate and rhythm, S1, S2 normal, no murmur, click, rub or gallop GI: pos bs, liver down 4 cm Extremities: edema 2+ Skin: Skin color, texture, turgor normal. No rashes or lesions or pale,   Lab Results:  Basename 07/30/12 0500 07/29/12 0456  WBC 11.0* 9.1  HGB 7.4* 8.5*  HCT 21.6* 25.0*  PLT 217 185   BMET:  Basename 07/30/12 0500 07/29/12 0456  NA 138 138  K 4.7 4.0  CL 100 100  CO2 25 27  GLUCOSE 97 114*  BUN 79* 59*  CREATININE 13.46* 10.41*  CALCIUM 8.7 8.6   No results found for this basename: PTH:2 in the last 72 hours Iron Studies: No results found for this basename: IRON,TIBC,TRANSFERRIN,FERRITIN in the last 72 hours  Studies/Results: Dg Chest Port 1 View  07/29/2012  *RADIOLOGY REPORT*  Clinical Data: Atelectasis.  PORTABLE CHEST - 1 VIEW  Comparison: 07/28/2012.  Findings: Endotracheal tube tip 1.9 cm above the carina.  Left central line tip mid superior vena cava level.  Nasogastric tube side hole just beyond the gastroesophageal junction.  No gross pneumothorax.  Mild central pulmonary vascular prominence.  No segmental consolidation.  Oblique subsegmental atelectasis right midlung.  IMPRESSION: No  significant change in the subsegmental atelectasis right midlung.   Original Report Authenticated By: Fuller Canada, M.D.     I have reviewed the patient's current medications.  Assessment/Plan: 1 AKI rhabdo oliburic.  ^ solute, needs HD, will do .  Needs eval of access.  May need PC.   2 Confusion matbolic, ? Reversible 3 Anemia epo and Fe 4 Nutrition use TF 5 Substance abuse P HD, eval access, epo , Fe,     LOS: 8 days   Martin Chapman L 07/30/2012,8:29 AM

## 2012-07-31 ENCOUNTER — Inpatient Hospital Stay (HOSPITAL_COMMUNITY): Payer: BC Managed Care – PPO

## 2012-07-31 ENCOUNTER — Encounter (HOSPITAL_COMMUNITY)
Admission: EM | Disposition: A | Discharge status: Home / Self Care | Payer: Self-pay | Source: Home / Self Care | Attending: Internal Medicine

## 2012-07-31 ENCOUNTER — Inpatient Hospital Stay (HOSPITAL_COMMUNITY): Payer: BC Managed Care – PPO | Admitting: Anesthesiology

## 2012-07-31 ENCOUNTER — Encounter (HOSPITAL_COMMUNITY): Payer: Self-pay | Admitting: Anesthesiology

## 2012-07-31 DIAGNOSIS — A0472 Enterocolitis due to Clostridium difficile, not specified as recurrent: Secondary | ICD-10-CM

## 2012-07-31 DIAGNOSIS — N186 End stage renal disease: Secondary | ICD-10-CM

## 2012-07-31 HISTORY — PX: INSERTION OF DIALYSIS CATHETER: SHX1324

## 2012-07-31 LAB — COMPREHENSIVE METABOLIC PANEL
BUN: 42 mg/dL — ABNORMAL HIGH (ref 6–23)
CO2: 27 mEq/L (ref 19–32)
Calcium: 8.4 mg/dL (ref 8.4–10.5)
Creatinine, Ser: 9.34 mg/dL — ABNORMAL HIGH (ref 0.50–1.35)
GFR calc Af Amer: 8 mL/min — ABNORMAL LOW (ref >90)
GFR calc non Af Amer: 7 mL/min — ABNORMAL LOW (ref >90)
Glucose, Bld: 101 mg/dL — ABNORMAL HIGH (ref 70–99)

## 2012-07-31 LAB — GLUCOSE, CAPILLARY
Glucose-Capillary: 104 mg/dL — ABNORMAL HIGH (ref 70–99)
Glucose-Capillary: 111 mg/dL — ABNORMAL HIGH (ref 70–99)
Glucose-Capillary: 112 mg/dL — ABNORMAL HIGH (ref 70–99)

## 2012-07-31 LAB — CULTURE, BLOOD (ROUTINE X 2)

## 2012-07-31 LAB — CBC
HCT: 22.1 % — ABNORMAL LOW (ref 39.0–52.0)
MCHC: 34.4 g/dL (ref 30.0–36.0)
MCV: 83.7 fL (ref 78.0–100.0)
RDW: 13.8 % (ref 11.5–15.5)

## 2012-07-31 LAB — PHOSPHORUS: Phosphorus: 4.6 mg/dL (ref 2.3–4.6)

## 2012-07-31 SURGERY — INSERTION OF DIALYSIS CATHETER
Anesthesia: General | Site: Neck | Laterality: Left | Wound class: Clean

## 2012-07-31 MED ORDER — SUCCINYLCHOLINE CHLORIDE 20 MG/ML IJ SOLN
INTRAMUSCULAR | Status: DC | PRN
Start: 2012-07-31 — End: 2012-07-31
  Administered 2012-07-31: 80 mg via INTRAVENOUS

## 2012-07-31 MED ORDER — LIDOCAINE HCL (PF) 1 % IJ SOLN: 5.0000 mL | INTRAMUSCULAR | Status: DC | PRN

## 2012-07-31 MED ORDER — ALTEPLASE 2 MG IJ SOLR
2.0000 mg | Freq: Once | INTRAMUSCULAR | Status: AC | PRN
  Filled 2012-07-31: qty 2

## 2012-07-31 MED ORDER — LIDOCAINE-PRILOCAINE 2.5-2.5 % EX CREA: 1.0000 "application " | TOPICAL_CREAM | CUTANEOUS | Status: DC | PRN

## 2012-07-31 MED ORDER — HALOPERIDOL LACTATE 5 MG/ML IJ SOLN
10.0000 mg | INTRAMUSCULAR | Status: AC | PRN
  Administered 2012-07-31: 5 mg via INTRAVENOUS
  Administered 2012-07-31: 10 mg via INTRAVENOUS
  Administered 2012-07-31: 5 mg via INTRAVENOUS
  Filled 2012-07-31: qty 2
  Filled 2012-07-31: qty 1

## 2012-07-31 MED ORDER — HALOPERIDOL LACTATE 5 MG/ML IJ SOLN
INTRAMUSCULAR | Status: AC
  Filled 2012-07-31: qty 2

## 2012-07-31 MED ORDER — SODIUM CHLORIDE 0.9 % IR SOLN
Status: DC | PRN
  Administered 2012-07-31: 09:00:00

## 2012-07-31 MED ORDER — FAMOTIDINE IN NACL 20-0.9 MG/50ML-% IV SOLN
20.0000 mg | Freq: Every day | INTRAVENOUS | Status: DC
  Administered 2012-08-01 – 2012-08-04 (×4): 20 mg via INTRAVENOUS
  Filled 2012-07-31 (×4): qty 50

## 2012-07-31 MED ORDER — HALOPERIDOL LACTATE 5 MG/ML IJ SOLN
10.0000 mg | Freq: Once | INTRAMUSCULAR | Status: AC
  Administered 2012-07-31: 10 mg via INTRAVENOUS
  Filled 2012-07-31: qty 2

## 2012-07-31 MED ORDER — ONDANSETRON HCL 4 MG/2ML IJ SOLN: 4.0000 mg | Freq: Once | INTRAMUSCULAR | Status: DC | PRN

## 2012-07-31 MED ORDER — NEPRO/CARBSTEADY PO LIQD: 237.0000 mL | ORAL | Status: DC | PRN

## 2012-07-31 MED ORDER — PROPOFOL 10 MG/ML IV BOLUS
INTRAVENOUS | Status: DC | PRN
  Administered 2012-07-31: 150 mg via INTRAVENOUS

## 2012-07-31 MED ORDER — PHENYLEPHRINE HCL 10 MG/ML IJ SOLN
INTRAMUSCULAR | Status: DC | PRN
  Administered 2012-07-31: 80 ug via INTRAVENOUS

## 2012-07-31 MED ORDER — SODIUM CHLORIDE 0.9 % IR SOLN
Status: DC | PRN
  Administered 2012-07-31: 1000 mL

## 2012-07-31 MED ORDER — HEPARIN SODIUM (PORCINE) 1000 UNIT/ML DIALYSIS
100.0000 [IU]/kg | INTRAMUSCULAR | Status: DC | PRN
  Filled 2012-07-31: qty 8

## 2012-07-31 MED ORDER — HYDROMORPHONE HCL PF 1 MG/ML IJ SOLN: 0.2500 mg | INTRAMUSCULAR | Status: DC | PRN

## 2012-07-31 MED ORDER — METHADONE HCL 5 MG/5ML PO SOLN: 20.0000 mg | Freq: Every day | ORAL | Status: DC

## 2012-07-31 MED ORDER — HALOPERIDOL LACTATE 5 MG/ML IJ SOLN: 5.0000 mg | Freq: Once | INTRAMUSCULAR | Status: AC

## 2012-07-31 MED ORDER — FENTANYL CITRATE 0.05 MG/ML IJ SOLN
INTRAMUSCULAR | Status: DC | PRN
  Administered 2012-07-31: 150 ug via INTRAVENOUS

## 2012-07-31 MED ORDER — HEPARIN SODIUM (PORCINE) 1000 UNIT/ML DIALYSIS
1000.0000 [IU] | INTRAMUSCULAR | Status: DC | PRN
  Filled 2012-07-31: qty 1

## 2012-07-31 MED ORDER — HEPARIN SODIUM (PORCINE) 1000 UNIT/ML IJ SOLN
INTRAMUSCULAR | Status: DC | PRN
  Administered 2012-07-31: 5 mL via INTRAVENOUS

## 2012-07-31 MED ORDER — CEFAZOLIN SODIUM 1-5 GM-% IV SOLN
INTRAVENOUS | Status: AC
  Filled 2012-07-31: qty 50

## 2012-07-31 MED ORDER — EPHEDRINE SULFATE 50 MG/ML IJ SOLN
INTRAMUSCULAR | Status: DC | PRN
Start: 2012-07-31 — End: 2012-07-31
  Administered 2012-07-31: 10 mg via INTRAVENOUS

## 2012-07-31 MED ORDER — SODIUM CHLORIDE 0.9 % IV SOLN: 100.0000 mL | INTRAVENOUS | Status: DC | PRN

## 2012-07-31 MED ORDER — CEFAZOLIN SODIUM 1-5 GM-% IV SOLN
1.0000 g | Freq: Three times a day (TID) | INTRAVENOUS | Status: DC
  Administered 2012-07-31: 1 g via INTRAVENOUS

## 2012-07-31 MED ORDER — HEPARIN SODIUM (PORCINE) 1000 UNIT/ML IJ SOLN
INTRAMUSCULAR | Status: AC
  Filled 2012-07-31: qty 1

## 2012-07-31 MED ORDER — QUETIAPINE FUMARATE 200 MG PO TABS
200.0000 mg | ORAL_TABLET | Freq: Two times a day (BID) | ORAL | Status: DC
  Administered 2012-07-31 – 2012-08-06 (×12): 200 mg via ORAL
  Filled 2012-07-31 (×13): qty 1

## 2012-07-31 MED ORDER — PENTAFLUOROPROP-TETRAFLUOROETH EX AERO: 1.0000 "application " | INHALATION_SPRAY | CUTANEOUS | Status: DC | PRN

## 2012-07-31 MED ORDER — HALOPERIDOL LACTATE 5 MG/ML IJ SOLN
5.0000 mg | INTRAMUSCULAR | Status: DC | PRN
  Administered 2012-07-31 – 2012-08-04 (×19): 5 mg via INTRAVENOUS
  Filled 2012-07-31 (×2): qty 1
  Filled 2012-07-31: qty 2
  Filled 2012-07-31 (×19): qty 1

## 2012-07-31 MED ORDER — LIDOCAINE HCL (CARDIAC) 20 MG/ML IV SOLN
INTRAVENOUS | Status: DC | PRN
  Administered 2012-07-31: 100 mg via INTRAVENOUS

## 2012-07-31 MED ORDER — METHADONE HCL 10 MG PO TABS
20.0000 mg | ORAL_TABLET | Freq: Every day | ORAL | Status: DC
  Administered 2012-07-31 – 2012-08-01 (×2): 20 mg via ORAL
  Filled 2012-07-31 (×2): qty 2
  Filled 2012-07-31: qty 1

## 2012-07-31 SURGICAL SUPPLY — 41 items
ADH SKN CLS APL DERMABOND .7 (GAUZE/BANDAGES/DRESSINGS) ×1
BAG DECANTER FOR FLEXI CONT (MISCELLANEOUS) ×2 IMPLANT
CATH CANNON HEMO 15F 50CM (CATHETERS) IMPLANT
CATH CANNON HEMO 15FR 19 (HEMODIALYSIS SUPPLIES) IMPLANT
CATH CANNON HEMO 15FR 23CM (HEMODIALYSIS SUPPLIES) ×2 IMPLANT
CATH CANNON HEMO 15FR 31CM (HEMODIALYSIS SUPPLIES) IMPLANT
CATH CANNON HEMO 15FR 32CM (HEMODIALYSIS SUPPLIES) ×2 IMPLANT
CLOTH BEACON ORANGE TIMEOUT ST (SAFETY) ×2 IMPLANT
COVER PROBE W GEL 5X96 (DRAPES) ×2 IMPLANT
COVER SURGICAL LIGHT HANDLE (MISCELLANEOUS) ×2 IMPLANT
DERMABOND ADVANCED (GAUZE/BANDAGES/DRESSINGS) ×1
DERMABOND ADVANCED .7 DNX12 (GAUZE/BANDAGES/DRESSINGS) ×1 IMPLANT
DRAPE C-ARM 42X72 X-RAY (DRAPES) ×2 IMPLANT
DRAPE CHEST BREAST 15X10 FENES (DRAPES) ×2 IMPLANT
GAUZE SPONGE 2X2 8PLY STRL LF (GAUZE/BANDAGES/DRESSINGS) ×1 IMPLANT
GAUZE SPONGE 4X4 16PLY XRAY LF (GAUZE/BANDAGES/DRESSINGS) ×2 IMPLANT
GLOVE BIOGEL PI IND STRL 7.5 (GLOVE) ×4 IMPLANT
GLOVE BIOGEL PI INDICATOR 7.5 (GLOVE) ×4
GLOVE SURG SS PI 7.5 STRL IVOR (GLOVE) ×4 IMPLANT
GOWN PREVENTION PLUS XXLARGE (GOWN DISPOSABLE) ×2 IMPLANT
GOWN STRL NON-REIN LRG LVL3 (GOWN DISPOSABLE) ×2 IMPLANT
KIT BASIN OR (CUSTOM PROCEDURE TRAY) ×2 IMPLANT
KIT ROOM TURNOVER OR (KITS) ×2 IMPLANT
NEEDLE 18GX1X1/2 (RX/OR ONLY) (NEEDLE) ×2 IMPLANT
NEEDLE HYPO 25GX1X1/2 BEV (NEEDLE) ×2 IMPLANT
NS IRRIG 1000ML POUR BTL (IV SOLUTION) ×2 IMPLANT
PACK SURGICAL SETUP 50X90 (CUSTOM PROCEDURE TRAY) ×2 IMPLANT
PAD ARMBOARD 7.5X6 YLW CONV (MISCELLANEOUS) ×4 IMPLANT
SOAP 2 % CHG 4 OZ (WOUND CARE) ×2 IMPLANT
SPONGE GAUZE 2X2 STER 10/PKG (GAUZE/BANDAGES/DRESSINGS) ×1
SUT ETHILON 3 0 PS 1 (SUTURE) ×2 IMPLANT
SUT VICRYL 4-0 PS2 18IN ABS (SUTURE) ×2 IMPLANT
SYR 20CC LL (SYRINGE) ×4 IMPLANT
SYR 30ML LL (SYRINGE) IMPLANT
SYR 5ML LL (SYRINGE) ×2 IMPLANT
SYR CONTROL 10ML LL (SYRINGE) ×2 IMPLANT
SYRINGE 10CC LL (SYRINGE) ×2 IMPLANT
TAPE CLOTH SURG 4X10 WHT LF (GAUZE/BANDAGES/DRESSINGS) ×2 IMPLANT
TOWEL OR 17X24 6PK STRL BLUE (TOWEL DISPOSABLE) ×2 IMPLANT
TOWEL OR 17X26 10 PK STRL BLUE (TOWEL DISPOSABLE) ×2 IMPLANT
WATER STERILE IRR 1000ML POUR (IV SOLUTION) ×2 IMPLANT

## 2012-07-31 NOTE — Op Note (Signed)
Procedure: Ultrasound-guided insertion of Diatek catheter  Preoperative diagnosis: End-stage renal disease  Postoperative diagnosis: Same  Anesthesia: Local with IV sedation  Operative findings: 27 cm Diatek catheter right internal jugular vein  Operative details: After obtaining informed consent, the patient was taken to the operating room. The patient was placed in supine position on the operating room table. After adequate sedation the patient's entire neck and chest were prepped and draped in usual sterile fashion. The patient was placed in Trendelenburg position. Ultrasound was used to identify the patient's left internal jugular vein. This had normal compressibility and respiratory variation. Local anesthesia was infiltrated over the left jugular vein.  Using ultrasound guidance, the left internal jugular vein was successfully cannulated.  A 0.035 J-tipped guidewire was threaded into the left internal jugular vein and into the superior vena cava followed by the inferior vena cava under fluoroscopic guidance.   Next sequential 12 and 14 dilators were placed over the guidewire into the right atrium.  A 16 French dilator with a peel-away sheath was then placed over the guidewire into the right atrium.   The guidewire and dilator were removed. A 27 cm Diatek catheter was then placed through the peel away sheath into the right atrium.  The catheter was then tunneled subcutaneously, cut to length, and the hub attached. The catheter was noted to flush and draw easily. The catheter was inspected under fluoroscopy and found with its tip to be in the right atrium without any kinks throughout its course. The catheter was sutured to the skin with nylon sutures. The neck insertion site was closed with Vicryl stitch. The catheter was then loaded with concentrated Heparin solution. A dry sterile dressing was applied.  The patient tolerated procedure well and there were no complications. Instrument sponge and  needle counts correct in the case. The patient was taken to the recovery room in stable condition. Chest x-ray will be obtained in the recovery room.  Wells Eden Rho, MD [facility]  Office: [phone] Pager: [phone] 

## 2012-07-31 NOTE — Progress Notes (Signed)
SLP Cancellation Note  Patient Details Name: Martin Chapman MRN: [id number] DOB: November 14, 1990   Cancelled treatment:       Reason Eval/Treat Not Completed: Fatigue/lethargy limiting ability to participate (sedated)  [name] MA, CCC-SLP [phone]  [name] Martin Chapman 07/31/2012, 4:05 PM

## 2012-07-31 NOTE — Care Management Note (Signed)
Page 1 of 2   08/10/2012     10:19:05 AM   CARE MANAGEMENT NOTE 08/10/2012  Patient:  Martin Chapman, Martin Chapman   Account Number:  [id number]  Date Initiated:  07/23/2012  Documentation initiated by:  [facility]  Subjective/Objective Assessment:   OD - unresponsive - intubated.     Action/Plan:   Anticipated DC Date:  07/30/2012   Anticipated DC Plan:  HOME W HOME HEALTH SERVICES  In-house referral  Clinical Social Worker      DC Planning Services  CM consult      Choice offered to / List presented to:             Status of service:  In process, will continue to follow Medicare Important Message given?   (If response is "NO", the following Medicare IM given date fields will be blank) Date Medicare IM given:   Date Additional Medicare IM given:    Discharge Disposition:    Per UR Regulation:  Reviewed for med. necessity/level of care/duration of stay  If discussed at Long Length of Stay Meetings, dates discussed:   07/31/2012  08/02/2012    Comments:  ContactBronislaw, [place] Father [phone]                Mother - Buena Irish - [phone]  08-10-12 10am Avie Arenas, RNBSN - [phone] Talked with Mother, Almira Coaster.  Mother does work full time and daughter and grandaughter live with her. States found out patient "using" about a year ago. Got [place] in [place] [facility] [place]  Stayed for 5 days - left - lived on streets until placed in [facility] rehab in [place] - stayed 28 days and then went [facility] live [facility] [place] where he stayed for about 3 monthers when he was kicked out to not following rules.  For the the next 2 weeks he lived with a couple in [place] and then came back to live with her the end of may.  He stayed with her for about 10 weeks - which she states was awful.  from there he went to jail for 71 days.  Came out on 10-18 and came to hospital on 10-20.  She will do what she can but is limited due to her work and care for  [employer].  08-09-12  2pm Avie Arenas, RNBSN [phone] Left message for mother to call back concerning discharge planning.  Awaiting call back.  08-07-12 3pm Avie Arenas, RNBSN [phone] Vac dc'd - off precedex today - started on depakote po. passed swallowing test - to well now that precedex is off for [facility].   Left message for father of update. Father called back - states patient lived with mother prior to admission and he is unable to be with son 24/7 on discharge but need to talk with mother.  07-31-12 1:20pm Avie Arenas, RNBSN [phone] [facility] cath placed today.  Off CRRT yesterday.  Still very agitated, continues on precedex.  Talked with father - agreeable with progression to [facility] when medically ready.  [facility] has assessed patient but will need HD records prior to approval with insurance to go to [facility].  07-30-12 10am Avie Arenas, RNBSN [phone] Delerium - extubated on precedex - continues on CRRT but plan for perm cath placement and intermittent HD.  [facility] referral made to [facility].  07-25-12 3:10pm Avie Arenas, RNBSN - [phone] Remains on vent - wound back.  More awake but lethargic.  07-23-12 4pm Avie Arenas, RNBSN [phone] Has parents - on vent.

## 2012-07-31 NOTE — Progress Notes (Signed)
Patient ID: Martin Chapman, male   DOB: Nov 16, 1990, 21 y.o.   MRN: [id number] Clear serosanguinous drainage from [facility] right arm.  Anticipate continue VAC for 1 week.

## 2012-07-31 NOTE — Anesthesia Postprocedure Evaluation (Signed)
  Anesthesia Post-op Note  Patient: Martin Chapman  Procedure(s) Performed: Procedure(s) (LRB) with comments: INSERTION OF DIALYSIS CATHETER (Left) - internal jugular  Patient Location: PACU  Anesthesia Type:General  Level of Consciousness: oriented  Airway and Oxygen Therapy: Patient Spontanous Breathing  Post-op Pain: none  Post-op Assessment: Post-op Vital signs reviewed, Patient's Cardiovascular Status Stable, Respiratory Function Stable, Patent Airway, No signs of Nausea or vomiting and Pain level controlled  Post-op Vital Signs: stable  Complications: No apparent anesthesia complications

## 2012-07-31 NOTE — Anesthesia Preprocedure Evaluation (Addendum)
Anesthesia Evaluation  Patient identified by MRN, date of birth, ID band Patient confused    Reviewed: Allergy & Precautions, H&P , NPO status , Patient's Chart, lab work & pertinent test results  Airway Mallampati: I TM Distance: >3 FB Neck ROM: full    Dental   Pulmonary          Cardiovascular Rhythm:regular Rate:Normal     Neuro/Psych PSYCHIATRIC DISORDERS Anxiety Depression  Neuromuscular disease    GI/Hepatic (+) Hepatitis -  Endo/Other    Renal/GU CRF, ESRF and DialysisRenal disease     Musculoskeletal   Abdominal   Peds  Hematology   Anesthesia Other Findings   Reproductive/Obstetrics                          Anesthesia Physical Anesthesia Plan  ASA: III  Anesthesia Plan: General   Post-op Pain Management:    Induction: Intravenous, Rapid sequence and Cricoid pressure planned  Airway Management Planned: Oral ETT  Additional Equipment:   Intra-op Plan:   Post-operative Plan:   Informed Consent: I have reviewed the patients History and Physical, chart, labs and discussed the procedure including the risks, benefits and alternatives for the proposed anesthesia with the patient or authorized representative who has indicated his/her understanding and acceptance.     Plan Discussed with: CRNA, Anesthesiologist and Surgeon  Anesthesia Plan Comments:       Anesthesia Quick Evaluation

## 2012-07-31 NOTE — H&P (Signed)
[facility] CONSULT NOTE 07/30/2012 DOB: 01/31/1991 MRN : [id number]  CC: ESRD Referring Physician: James L Deterding, MD  History of Present Illness: Martin Chapman is a 21 y.o. male with a past history of heroin addiction, depression, anxiety and panic attacks. Brought to ER yesterday by his friends (around 2PM) after found unresponsive overdosed on heroin and alcohol the night before. On 1020 had fasciotomy and angiography (75mL contrast volume) by Dr Duda for right arm compartment syndrome with a right peri clavicular hematoma. Dr Fields placed a temporary right femoral HD catheter at that time. Pt creatinine has been climbing over the past several days - 13.46 today. We were asked to place a diatek tunneled catheter.     Past Medical History  Diagnosis Date  . Anxiety   . Panic attacks   . Depression     Past Surgical History  Procedure Date  . Extraction of wisdom teeth   . Intraoperative arteriogram 07/22/2012    Procedure: INTRA OPERATIVE ARTERIOGRAM;  Surgeon: Charles E Fields, MD;  Location: [facility] OR;  Service: Vascular;  Laterality: Left;  arch aortagram, second order catheterization right subclavian artery, ultrasound left groin  . Insertion of dialysis catheter 07/22/2012    Procedure: INSERTION OF DIALYSIS CATHETER;  Surgeon: Charles E Fields, MD;  Location: [facility] OR;  Service: Vascular;  Laterality: Right;  . Fasciotomy 07/22/2012    Procedure: FASCIOTOMY;  Surgeon: Marcus V Duda, MD;  Location: [facility] OR;  Service: Orthopedics;  Laterality: Right;  . I&d extremity 07/24/2012    Procedure: IRRIGATION AND DEBRIDEMENT EXTREMITY;  Surgeon: Marcus V Duda, MD;  Location: [facility] OR;  Service: Orthopedics;  Laterality: Right;  Right Arm Irrigation and Debridement, VAC Application  . Application of wound vac 07/24/2012    Procedure: APPLICATION OF WOUND VAC;  Surgeon: Marcus V Duda, MD;  Location: [facility] OR;  Service: Orthopedics;  Laterality: Right;     ROS:  [x] Positive  [ ] Denies    General: [ ] Weight loss, [ ] Fever, [ ] chills Neurologic: [ ] Dizziness, [ ] Blackouts, [ ] Seizure [ ] Stroke, [ ] "Mini stroke", [ ] Slurred speech, [ ] Temporary blindness; [ ] weakness in arms or legs, [ ] Hoarseness Cardiac: [ ] Chest pain/pressure, [ ] Shortness of breath at rest [ ] Shortness of breath with exertion, [ ] Atrial fibrillation or irregular heartbeat Vascular: [ ] Pain in legs with walking, [ ] Pain in legs at rest, [ ] Pain in legs at night,  [ ] Non-healing ulcer, [ ] Blood clot in vein/DVT,   Pulmonary: [ ] Home oxygen, [ ] Productive cough, [ ] Coughing up blood, [ ] Asthma,  [ ] Wheezing Musculoskeletal:  [ ] Arthritis, [ ] Low back pain, [ ] Joint pain Hematologic: [ ] Easy Bruising, [ ] Anemia; [ ] Hepatitis Gastrointestinal: [ ] Blood in stool, [ ] Gastroesophageal Reflux/heartburn, [ ] Trouble swallowing Urinary: [ ] chronic Kidney disease, [ ] on HD - [ ] MWF or [ ] TTHS, [ ] Burning with urination, [ ] Difficulty urinating Skin: [ ] Rashes, [ ] Wounds Psychological: [x ] Anxiety, [x ] Depression  Social History History  Substance Use Topics  . Smoking status: Current Every Day Smoker -- 1.0 packs/day    Types: Cigarettes  . Smokeless tobacco: Not on file  . Alcohol Use: Yes     rare    Family History Family History    Problem Relation Age of Onset  . Hypertension Father     No Known Allergies  Current Facility-Administered Medications  Medication Dose Route Frequency Provider Last Rate Last Dose  . 0.9 %  sodium chloride infusion  250 mL Intravenous PRN Vineet Sood, MD 10 mL/hr at 07/27/12 0454 250 mL at 07/27/12 0454  . 0.9 %  sodium chloride infusion  100 mL Intravenous PRN Richard F Fox, MD      . 0.9 %  sodium chloride infusion  100 mL Intravenous PRN Richard F Fox, MD      . alteplase (ACTIVASE) injection 4 mg  4 mg Intracatheter Once James L Deterding, MD   4 mg at 07/30/12 0750  . Chlorhexidine Gluconate Cloth  2 % PADS 6 each  6 each Topical Q0600 Vineet Sood, MD   6 each at 07/30/12 0600  . darbepoetin (ARANESP) injection 150 mcg  150 mcg Intravenous Q Sat-HD Richard F Fox, MD   150 mcg at 07/28/12 1026  . dexmedetomidine (PRECEDEX) 400 mcg / 100 mL infusion  0.4-1.2 mcg/kg/hr Intravenous Titrated Daniel J Feinstein, MD 18 mL/hr at 07/30/12 1508 0.9 mcg/kg/hr at 07/30/12 1508  . dextrose 5 %-0.9 % sodium chloride infusion   Intravenous Continuous Richard F Fox, MD 10 mL/hr at 07/29/12 0914    . famotidine (PEPCID) IVPB 20 mg  20 mg Intravenous Q12H Vineet Sood, MD   20 mg at 07/30/12 1402  . feeding supplement (NEPRO CARB STEADY) liquid 1,000 mL  1,000 mL Per Tube Continuous Kimberly Alverson Harris, RD      . feeding supplement (NEPRO CARB STEADY) liquid 237 mL  237 mL Oral PRN Richard F Fox, MD      . fentaNYL (SUBLIMAZE) injection 25-50 mcg  25-50 mcg Intravenous Q2H PRN Vineet Sood, MD      . ferric gluconate (NULECIT) 125 mg in sodium chloride 0.9 % 100 mL IVPB  125 mg Intravenous Q T,Th,Sa-HD Richard F Fox, MD   125 mg at 07/28/12 1054  . heparin injection 1,000 Units  1,000 Units Dialysis PRN Richard F Fox, MD   1,000 Units at 07/30/12 1245  . heparin injection 1,600 Units  20 Units/kg Dialysis PRN Richard F Fox, MD   1,600 Units at 07/28/12 0700  . heparin injection 1,600 Units  20 Units/kg Dialysis PRN Richard F Fox, MD      . heparin injection 1,600 Units  20 Units/kg Dialysis PRN Richard F Fox, MD   1,600 Units at 07/30/12 0931  . heparin injection 5,000 Units  5,000 Units Subcutaneous Q8H Stephanie E Losq, MD   5,000 Units at 07/30/12 1403  . lidocaine (XYLOCAINE) 1 % injection 5 mL  5 mL Intradermal PRN Richard F Fox, MD      . lidocaine-prilocaine (EMLA) cream 1 application  1 application Topical PRN Richard F Fox, MD      . LORazepam (ATIVAN) injection 1-2 mg  1-2 mg Intravenous Q1H PRN Vineet Sood, MD   2 mg at 07/30/12 1120  . LORazepam (ATIVAN) tablet 1 mg  1 mg Oral BID Amber M  Hairford, MD      . multivitamin (RENA-VIT) tablet 1 tablet  1 tablet Oral Daily James L Deterding, MD   1 tablet at 07/30/12 1401  . ondansetron (ZOFRAN) injection 4 mg  4 mg Intravenous Q6H PRN Charles E Fields, MD      . pentafluoroprop-tetrafluoroeth (GEBAUERS) aerosol 1 application  1 application Topical PRN Richard F Fox, MD      .   QUEtiapine (SEROQUEL) tablet 100 mg  100 mg Oral BID Amber M Hairford, MD      . saccharomyces boulardii (FLORASTOR) capsule 250 mg  250 mg Oral BID Vineet Sood, MD   250 mg at 07/30/12 1401  . vancomycin (VANCOCIN) 50 mg/mL oral solution 500 mg  500 mg Per Tube Q6H Vineet Sood, MD   500 mg at 07/30/12 1402  . DISCONTD: dexmedetomidine (PRECEDEX) 200 mcg / 50 mL infusion  0.2-0.7 mcg/kg/hr Intravenous Continuous Vineet Sood, MD 14.2 mL/hr at 07/30/12 0857 0.7 mcg/kg/hr at 07/30/12 0857  . DISCONTD: feeding supplement (NEPRO CARB STEADY) liquid 1,000 mL  1,000 mL Per Tube Continuous Stephanie E Losq, MD      . DISCONTD: LORazepam (ATIVAN) tablet 0.5 mg  0.5 mg Oral BID Amber M Hairford, MD   0.5 mg at 07/30/12 1401  . DISCONTD: piperacillin-tazobactam (ZOSYN) IVPB 2.25 g  2.25 g Intravenous Q8H Lauren Bajbus, PHARMD   2.25 g at 07/29/12 2111     Imaging: Dg Chest Port 1 View  07/29/2012  *RADIOLOGY REPORT*  Clinical Data: Atelectasis.  PORTABLE CHEST - 1 VIEW  Comparison: 07/28/2012.  Findings: Endotracheal tube tip 1.9 cm above the carina.  Left central line tip mid superior vena cava level.  Nasogastric tube side hole just beyond the gastroesophageal junction.  No gross pneumothorax.  Mild central pulmonary vascular prominence.  No segmental consolidation.  Oblique subsegmental atelectasis right midlung.  IMPRESSION: No significant change in the subsegmental atelectasis right midlung.   Original Report Authenticated By: STEVEN R. OLSON, M.D.     Significant Diagnostic Studies: CBC Lab Results  Component Value Date   WBC 11.0* 07/30/2012   HGB 7.4* 07/30/2012    HCT 21.6* 07/30/2012   MCV 84.7 07/30/2012   PLT 217 07/30/2012    BMET    Component Value Date/Time   NA 138 07/30/2012 0500   K 4.7 07/30/2012 0500   CL 100 07/30/2012 0500   CO2 25 07/30/2012 0500   GLUCOSE 97 07/30/2012 0500   BUN 79* 07/30/2012 0500   CREATININE 13.46* 07/30/2012 0500   CALCIUM 8.7 07/30/2012 0500   GFRNONAA 5* 07/30/2012 0500   GFRAA 5* 07/30/2012 0500    COAG Lab Results  Component Value Date   INR 1.65* 07/24/2012   INR 1.64* 07/23/2012   INR 2.39* 07/23/2012   No results found for this basename: PTT     Physical Examination BP Readings from Last 3 Encounters:  07/30/12 102/51  07/30/12 102/51  07/30/12 102/51   Temp Readings from Last 3 Encounters:  07/30/12 98 F (36.7 C) Oral  07/30/12 98 F (36.7 C) Oral  07/30/12 98 F (36.7 C) Oral   SpO2 Readings from Last 3 Encounters:  07/30/12 99%  07/30/12 99%  07/30/12 99%   Pulse Readings from Last 3 Encounters:  07/30/12 81  07/30/12 81  07/30/12 81    General:  WDWN male with confusion and paranoia Pulmonary: normal non-labored breathing , without Rales, rhonchi,  wheezing Cardiac: RRR, without  Murmurs, rubs or gallops; Abdomen: soft, NT, no masses Skin: no rashes, ulcers noted Vascular Exam/Pulses:  Extremities without ischemic changes, no Gangrene , no cellulitis; no open wounds;  Musculoskeletal: no muscle wasting or atrophy  Neurologic: confusion  ASSESSMENT/PLAN: Martin Chapman is a 21 y.o. male with acute ESRD. Pt needs HD catheter placed. Temp femoral catheter not working well. Will plan catheter tomorrow     Value  Date    INR  1.65*  07/24/2012    INR  1.64*  07/23/2012    INR  2.39*  07/23/2012    No results found for this basename: PTT   Physical Examination  BP Readings from Last 3 Encounters:   07/30/12  102/51   07/30/12  102/51   07/30/12  102/51    Temp Readings from Last 3 Encounters:   07/30/12  98 F (36.7 C) Oral   07/30/12  98 F (36.7 C) Oral   07/30/12  98 F (36.7 C) Oral    SpO2 Readings from Last 3 Encounters:   07/30/12  99%   07/30/12  99%   07/30/12  99%    Pulse Readings from Last 3 Encounters:   07/30/12  81   07/30/12  81   07/30/12  81   General: WDWN male with confusion and paranoia  Pulmonary: normal non-labored breathing , without Rales, rhonchi, wheezing  Cardiac: RRR, without Murmurs, rubs or gallops;  Abdomen: soft, NT, no masses  Skin: no rashes, ulcers noted  Vascular Exam/Pulses:  Extremities without ischemic changes, no Gangrene , no cellulitis; no open wounds;  Musculoskeletal: no muscle wasting or atrophy  Neurologic: confusion   ASSESSMENT/PLAN: Martin Chapman is a 21 y.o. male with acute ESRD. Pt needs HD catheter placed. Temp femoral catheter not working well.  Will plan catheter tomorrow

## 2012-07-31 NOTE — Anesthesia Procedure Notes (Signed)
Procedure Name: Intubation Date/Time: 07/31/2012 9:37 AM Performed by: Marena Chancy Pre-anesthesia Checklist: Patient identified, Timeout performed, Emergency Drugs available, Suction available and Patient being monitored Patient Re-evaluated:Patient Re-evaluated prior to inductionOxygen Delivery Method: Circle system utilized Preoxygenation: Pre-oxygenation with 100% oxygen Intubation Type: IV induction, Cricoid Pressure applied and Rapid sequence Laryngoscope Size: Miller and 2 Grade View: Grade I Tube type: Oral Tube size: 7.5 mm Number of attempts: 1 Placement Confirmation: ETT inserted through vocal cords under direct vision,  breath sounds checked- equal and bilateral and positive ETCO2 Secured at: 23 cm Tube secured with: Tape Dental Injury: Teeth and Oropharynx as per pre-operative assessment

## 2012-07-31 NOTE — Progress Notes (Signed)
PT Cancellation Note  Patient Details Name: Martin Chapman MRN: [id number] DOB: Feb 04, 1991   Cancelled Treatment:     Pt in OR.   Destenie Ingber 07/31/2012, 3:38 PM

## 2012-07-31 NOTE — Transfer of Care (Signed)
Immediate Anesthesia Transfer of Care Note  Patient: Martin Chapman  Procedure(s) Performed: Procedure(s) (LRB) with comments: INSERTION OF DIALYSIS CATHETER (Left) - internal jugular  Patient Location: PACU  Anesthesia Type:General  Level of Consciousness: sedated  Airway & Oxygen Therapy: Patient Spontanous Breathing and Patient connected to nasal cannula oxygen  Post-op Assessment: Report given to PACU RN and Post -op Vital signs reviewed and stable  Post vital signs: Reviewed and stable  Complications: No apparent anesthesia complications

## 2012-07-31 NOTE — Progress Notes (Signed)
Name: Martin Chapman MRN: [id number] DOB: 06-16-1991    LOS: 9  PULMONARY / CRITICAL CARE MEDICINE  HPI:  21 years old male with PMH relevant for heroin addiction, depression, anxiety and panic attacks. He overdosed on heroin and alcohol 10/19 PM  And 10/20 PM he was found unresponsive at about 2:00 pm. Pt now on vent, s/p compartment syndrome and release in OR of R shoulder muscles.   Events since admission: 10/20- Admitted to ICU 10/20 - Surgery with shoulder release and fasciotomy of right arm 10/21 - Started on CVVH>>d/c 10/22 10/22 - Return to OR for I&D with wound vac placement. Bicep excised  10/25 - Vac change 10/26 - diarrhea>>C diff positive 10/27 - extubated 10/27 - agitation, precedex started 10/29 - HD cath placement  Current Status: Int agitation when precedex lowered, cursing & yelling out  Vital Signs: Temp:  [98 F (36.7 C)-99.6 F (37.6 C)] 98.5 F (36.9 C) (10/29 0354) Pulse Rate:  [74-133] 81  (10/29 0600) Resp:  [9-26] 9  (10/29 0600) BP: (86-145)/(37-81) 97/44 mmHg (10/29 0600) SpO2:  [94 %-100 %] 99 % (10/29 0600) Weight:  [174 lb 6.1 oz (79.1 kg)-176 lb 12.9 oz (80.2 kg)] 175 lb 11.3 oz (79.7 kg) (10/29 0500)  Physical Examination: General - Asleep in bed, no distress HEENT - [place] in place Cardiac - s1s2 regular Chest - CTAB, no wheeze Abd - soft, nontender. Hypoactive BS Ext - Rt upper arm wrapped in ACE, wound vac not in place. Good pulses all extremities Neuro - Sedated, does not follow commands on my exam  No results found.  ASSESSMENT AND PLAN  PULMONARY No results found for this basename: PHART:5,PCO2:5,PCO2ART:5,PO2ART:5,HCO3:5,O2SAT:5 in the last 168 hours Ventilator Settings:    A:   1) Acute hypoxic respiratory failure likely secondary to aspiration pneumonia / pneumonitis P:   Extubated 10/27 with agitation Adjust oxygen to keep Spo2 > 92%   CARDIOVASCULAR No results found for this basename:  TROPONINI:5,LATICACIDVEN:5, O2SATVEN:5,PROBNP:5 in the last 168 hours  Lines:  Left IJ placed 07/22/12 >> R femoral HD cath 10/20>>10/29 Art line L radial 10/21>>10/24 Left tunneled IJ HD cath 10/29>>  Echo 10/22>>EF 55 to 60%  A:  1) Hypovolemic/septic shock>>off pressors 10/24. 2) Lactic acidosis likely secondary to shock and rhabdomyolysis>>resolved 3) Right upper extremity injury with compartment syndrome and possible vascular injury Note arch aortogram in OR normal.  No vascular deficit in RUE/shoulder Elevated troponin P:  Wound care per ortho and wound care team Qtc 440   RENAL  Lab 07/31/12 0500 07/30/12 0500 07/29/12 0456 07/28/12 0453 07/27/12 0450 07/26/12 0743  NA 142 138 138 143 142 --  K 3.5 4.7 -- -- -- --  CL 102 100 100 104 103 --  CO2 27 25 27 28 30  --  BUN 42* 79* 59* 70* 52* --  CREATININE 9.34* 13.46* 10.41* 12.32* 9.57* --  CALCIUM 8.4 8.7 8.6 8.2* 8.0* --  MG 2.3 -- -- -- 2.5 2.7*  PHOS 4.6 6.5* 5.0* 5.0* 3.4 --   Intake/Output      10/28 0701 - 10/29 0700 10/29 0701 - 10/30 0700   I.V. (mL/kg) 619 (7.8)    NG/GT 120    IV Piggyback 50    Total Intake(mL/kg) 789 (9.9)    Drains 300    Other 1025    Stool 275    Total Output 1600    Net -811          Foley:  07/22/12  A:   1) Acute renal failure s/p CRRT>>transitioned to intermittent HD 2) Rhabdomyolysis 3) Hyperkalemia>>>resolved P:   HD per renal Perm cath placement 10/29 F/u Renal panel in AM   GASTROINTESTINAL  Lab 07/31/12 0500 07/30/12 0500 07/29/12 0456 07/28/12 0453 07/27/12 0450  AST 116* 148* 166* 253* 550*  ALT 98* 102* 116* 140* 200*  ALKPHOS 56 58 70 66 84  BILITOT 0.3 0.3 0.3 0.4 0.4  PROT 5.0* 5.0* 5.3* 4.7* 4.8*  ALBUMIN 2.2* 2.1*2.1* 2.2* 2.0* 2.2*    A:   1) Elevated LFT's and INR likely shock liver 2) Nutrition 3) Diarrhea 10/26 from C diff P:   Continue tube feeds Advance diet with MS allows, per SLP recs  HEMATOLOGIC  Lab 07/31/12 0500 07/30/12  0500 07/29/12 0456 07/28/12 0453 07/27/12 0450  HGB 7.6* 7.4* 8.5* 7.9* 7.8*  HCT 22.1* 21.6* 25.0* 22.8* 22.8*  PLT 275 217 185 130* 120*  INR -- -- -- -- --  APTT -- -- -- -- --   A:   1) Elevated INR likely from shock liver>>resolved 2) Anemia of critical illness 3) Thrombocytopenia>>improving P:  F/u CBC; trending down Epo and aranesp per renal Transfuse for Hb < 7   INFECTIOUS  Lab 07/31/12 0500 07/30/12 0500 07/29/12 0456 07/28/12 0453 07/27/12 0450 07/25/12 0443  WBC 12.9* 11.0* 9.1 9.4 7.5 --  PROCALCITON -- -- -- -- -- 20.19   Cultures: Blood culture 10/20>>ng Urine culture 10/20>> negative BCx 10/23>> NGTD UCX 10/23 >>negative C diff 10/26>>Positive  Antibiotics: Zosyn 10/20>>10/28 IV Vancomycin 10/20>>10/26 PO vancomycin 10/26>>   A:   1) Aspiration pneumonia. 2) Rt arm compartment syndrome 3) Recurrent fever>>10/23 4) Diarrhea 10/26 from C diff P:   Continue po vancomycin   ENDOCRINE  Lab 07/31/12 0353 07/31/12 0021 07/30/12 2020 07/30/12 1632 07/30/12 1150  GLUCAP 106* 112* 109* 102* 97   A:   1) Hyperglycemia>>improved; no Hx of DM P:   POCT glucose TID    NEUROLOGIC  A:   1) Acute encephalopathy from OD; sedated on vent  P:   - Precedex for agitation  - Scheduled Ativan 1mg  BID, as well as prn ativan as needed for agitation  - Seroquel started 10/28>> Increase dose to 200mg  - Start Methadone 20mg  given chronic opioid use. Will monitor daily Qtc - Daily WUA - Sitter at bedside, as available, for agitation and suicide precautions   GLOBAL - Can transition out of ICU once off precedex  BEST PRACTICE / DISPOSITION - Level of Care:  ICU - Primary Service:  PCCM - Consultants:  Vascular surgery, orthopedic surgery, nephrology - Code Status:  Full code - Diet:  NPO - DVT Px:  SCD, Hep SQ - GI Px:  Protonix - Skin Integrity:  Intact - Social / Family:  No family at [employer] M. Hairford, M.D. 07/31/2012 7:35 AM     Care during the described time interval was provided by me and/or other providers on the critical care team.  I have reviewed this patient's available data, including medical history, events of note, physical examination and test results as part of my evaluation  CC time x  32 m  Cyril Mourning MD. Tonny Bollman. [facility] Pager [phone] If no response call [phone]

## 2012-07-31 NOTE — Preoperative (Signed)
Beta Blockers   Reason not to administer Beta Blockers:Not Applicable 

## 2012-07-31 NOTE — Progress Notes (Signed)
Subjective: Interval History: none.  Objective: Vital signs in last 24 hours: Temp:  [98 F (36.7 C)-99.6 F (37.6 C)] 98.5 F (36.9 C) (10/29 0354) Pulse Rate:  [74-133] 81  (10/29 0600) Resp:  [9-26] 9  (10/29 0600) BP: (86-145)/(37-81) 97/44 mmHg (10/29 0600) SpO2:  [94 %-100 %] 99 % (10/29 0600) Weight:  [79.1 kg (174 lb 6.1 oz)-80.2 kg (176 lb 12.9 oz)] 79.7 kg (175 lb 11.3 oz) (10/29 0500) Weight change: -0.2 kg (-7.1 oz)  Intake/Output from previous day: 10/28 0701 - 10/29 0700 In: 789 [I.V.:619; NG/GT:120; IV Piggyback:50] Out: 1600 [Drains:300; Stool:275] Intake/Output this shift:    General appearance: sedated and poorly responsive Resp: diminished breath sounds bilaterally and rales bibasilar Cardio: S1, S2 normal GI: pos bs, liver down 2 cm Extremities: R fem cath,   Lab Results:  Basename 07/31/12 0500 07/30/12 0500  WBC 12.9* 11.0*  HGB 7.6* 7.4*  HCT 22.1* 21.6*  PLT 275 217   BMET:  Basename 07/31/12 0500 07/30/12 0500  NA 142 138  K 3.5 4.7  CL 102 100  CO2 27 25  GLUCOSE 101* 97  BUN 42* 79*  CREATININE 9.34* 13.46*  CALCIUM 8.4 8.7   No results found for this basename: PTH:2 in the last 72 hours Iron Studies: No results found for this basename: IRON,TIBC,TRANSFERRIN,FERRITIN in the last 72 hours  Studies/Results: No results found.  I have reviewed the patient's current medications.  Assessment/Plan: 1 AKI oliguric , solute ok, acid/base, K ok.  For PC today. Do HD in am 2 Anemia EPO/fe 3 Nutrition TF 4 Confusion ? If will resolve 5 Substance abuse  P HD, PC, epo/fe    LOS: 9 days   Jadea Shiffer L 07/31/2012,7:51 AM

## 2012-07-31 NOTE — Progress Notes (Signed)
1610 Patient transported to OR with RN and transporter. Pt given haldol for combative behavior prior to transfer. Pt TF on hold. Continuous precedex gtt. Please view ICU flowsheet for pt assessment and gtts. VSS during transfer. OR RN given report and patient transferred to OR monitor. VSS. Patient resting in bed. Tina at bedside sitting with patient (suicide precautions). All questions answered to OR RN. Family updated (mother, father, sister) as to patient's status.  Vanna Scotland RN

## 2012-08-01 ENCOUNTER — Inpatient Hospital Stay (HOSPITAL_COMMUNITY): Payer: BC Managed Care – PPO

## 2012-08-01 LAB — CBC
MCH: 30.5 pg (ref 26.0–34.0)
MCHC: 35.8 g/dL (ref 30.0–36.0)
Platelets: 378 10*3/uL (ref 150–400)
RDW: 14.3 % (ref 11.5–15.5)

## 2012-08-01 LAB — GLUCOSE, CAPILLARY
Glucose-Capillary: 101 mg/dL — ABNORMAL HIGH (ref 70–99)
Glucose-Capillary: 103 mg/dL — ABNORMAL HIGH (ref 70–99)
Glucose-Capillary: 104 mg/dL — ABNORMAL HIGH (ref 70–99)
Glucose-Capillary: 113 mg/dL — ABNORMAL HIGH (ref 70–99)
Glucose-Capillary: 94 mg/dL (ref 70–99)
Glucose-Capillary: 98 mg/dL (ref 70–99)

## 2012-08-01 LAB — RENAL FUNCTION PANEL
Albumin: 2.4 g/dL — ABNORMAL LOW (ref 3.5–5.2)
Calcium: 8.2 mg/dL — ABNORMAL LOW (ref 8.4–10.5)
GFR calc Af Amer: 6 mL/min — ABNORMAL LOW (ref >90)
Phosphorus: 3.9 mg/dL (ref 2.3–4.6)
Potassium: 3.4 mEq/L — ABNORMAL LOW (ref 3.5–5.1)
Sodium: 138 mEq/L (ref 135–145)

## 2012-08-01 MED ORDER — METHADONE HCL 10 MG PO TABS
20.0000 mg | ORAL_TABLET | Freq: Once | ORAL | Status: AC
  Administered 2012-08-01: 20 mg via ORAL
  Filled 2012-08-01: qty 20
  Filled 2012-08-01: qty 1

## 2012-08-01 MED ORDER — WHITE PETROLATUM GEL
Status: AC
  Administered 2012-08-01: 0.2
  Filled 2012-08-01: qty 5

## 2012-08-01 MED ORDER — LORAZEPAM 1 MG PO TABS
2.0000 mg | ORAL_TABLET | Freq: Three times a day (TID) | ORAL | Status: DC
  Administered 2012-08-01 – 2012-08-08 (×22): 2 mg via ORAL
  Filled 2012-08-01 (×16): qty 2
  Filled 2012-08-01: qty 1
  Filled 2012-08-01 (×5): qty 2

## 2012-08-01 MED ORDER — RENA-VITE PO TABS
1.0000 | ORAL_TABLET | Freq: Every day | ORAL | Status: DC
  Administered 2012-08-01 – 2012-08-19 (×19): 1 via ORAL
  Filled 2012-08-01 (×21): qty 1

## 2012-08-01 MED ORDER — HALOPERIDOL LACTATE 5 MG/ML IJ SOLN
5.0000 mg | Freq: Once | INTRAMUSCULAR | Status: AC
  Administered 2012-08-01: 5 mg via INTRAVENOUS

## 2012-08-01 MED ORDER — METHADONE HCL 10 MG PO TABS
40.0000 mg | ORAL_TABLET | Freq: Every day | ORAL | Status: DC
  Administered 2012-08-02 – 2012-08-04 (×3): 40 mg via ORAL
  Filled 2012-08-01 (×3): qty 4

## 2012-08-01 NOTE — Progress Notes (Signed)
Subjective: Interval History: none.  Objective: Vital signs in last 24 hours: Temp:  [98.2 F (36.8 C)-100.3 F (37.9 C)] 99 F (37.2 C) (10/30 0400) Pulse Rate:  [84-108] 84  (10/30 0700) Resp:  [10-31] 20  (10/30 0500) BP: (109-144)/(53-97) 132/72 mmHg (10/30 0700) SpO2:  [96 %-100 %] 99 % (10/30 0700) Weight:  [79.4 kg (175 lb 0.7 oz)] 79.4 kg (175 lb 0.7 oz) (10/30 0300) Weight change: -0.8 kg (-1 lb 12.2 oz)  Intake/Output from previous day: 10/29 0701 - 10/30 0700 In: 1468 [I.V.:808; NG/GT:660] Out: 700 [Drains:400; Stool:300] Intake/Output this shift:    General appearance: sedated, did not wake up due to violent behaviors Resp: rhonchi bilaterally Cardio: S1, S2 normal and systolic murmur: holosystolic 2/6, blowing at apex GI: pos bs, soft, liver down 5 cm Extremities: extremities normal, atraumatic, no cyanosis or edema LIJ cath  Lab Results:  Basename 08/01/12 0500 07/31/12 0500  WBC 14.4* 12.9*  HGB 8.1* 7.6*  HCT 22.6* 22.1*  PLT 378 275   BMET:  Basename 08/01/12 0500 07/31/12 0500  NA 138 142  K 3.4* 3.5  CL 99 102  CO2 25 27  GLUCOSE 100* 101*  BUN 52* 42*  CREATININE 11.90* 9.34*  CALCIUM 8.2* 8.4   No results found for this basename: PTH:2 in the last 72 hours Iron Studies: No results found for this basename: IRON,TIBC,TRANSFERRIN,FERRITIN in the last 72 hours  Studies/Results: Dg Chest Port 1 View  07/31/2012  *RADIOLOGY REPORT*  Clinical Data: Evaluate for pneumothorax.  Insertion of dialysis catheter.  PORTABLE CHEST - 1 VIEW  Comparison: Chest x-ray 07/29/2012.  Findings: There is a new left internal jugular PermCath and position with the tips terminating in the right atrium and at the superior cavoatrial junction. There is a left-sided internal jugular central venous catheter with tip terminating in the distal left innominate vein.  Nasogastric tube has been withdrawn, now with tip near the gastroesophageal junction.  Lung volumes are low.  No pneumothorax.  No acute consolidative airspace disease. Crowding of the pulmonary vasculature, accentuated by low lung volumes, without frank pulmonary edema.  No pleural effusions. Heart size is normal.  Mediastinal contours are unremarkable.  IMPRESSION: 1.  New left IJ PermCath appears properly located. 2.  No definite pneumothorax following dialysis catheter placement. 3.  Tip of nasogastric tube is now at the gastroesophageal junction and could be advanced at least 8 cm for more optimal placement. Additional support apparatus, as above.   Original Report Authenticated By: Florencia Reasons, M.D.     I have reviewed the patient's current medications.  Assessment/Plan: 1 AKI oliguric, for HD, acid/base/K ok. Will take vol of that has been given 2 Enceph  ? If will resolve, on sedation 3 Anemia on epo/fe 4 Wound of pressure per surgery 5 nutirition difficult with behavior, follow P HD, K in bath, sedation    LOS: 10 days   Martin Chapman 08/01/2012,7:19 AM

## 2012-08-01 NOTE — Progress Notes (Signed)
[facility] ADULT ICU REPLACEMENT PROTOCOL FOR AM LAB REPLACEMENT ONLY  The patient does not apply for the [facility] Adult ICU Electrolyte Replacment Protocol based on the criteria listed below:   1. Is GFR >/= 50 ml/min? no  Patient's GFR today is 5  2. Is BUN < 30 mg/dL? no  Patient's BUN today is 52 3. Abnormal electrolyte(s): K-3.4   Llana Aliment 08/01/2012 6:23 AM

## 2012-08-01 NOTE — Progress Notes (Signed)
1830   Successfully weaned Precedex gtt off by 1600 today. Discussed increased agitation with Dr. Delford Field. RN instructed to turn precedex gtt back on. Gtt back on at 1.2 per verbal order.

## 2012-08-01 NOTE — Progress Notes (Signed)
SLP Cancellation Note  Patient Details Name: Martin Chapman MRN: [id number] DOB: 09/20/1991   Cancelled treatment:       Reason Eval/Treat Not Completed: Fatigue/lethargy limiting ability to participate (patient being kept sedated due to HD this pm)  Will f/u 10/31 am.   [name] MA, CCC-SLP [phone]    [name] 08/01/2012, 8:36 AM

## 2012-08-01 NOTE — Progress Notes (Signed)
PT Cancellation Note  Patient Details Name: Martin Chapman MRN: [id number] DOB: Nov 16, 1990   Cancelled Treatment:    Reason Eval/Treat Not Completed: Patient at procedure or test/unavailable.  Pt sedated and on HD.  Will f/u tomorrow.     Sunny Schlein, [place] [phone] 08/01/2012, 10:25 AM

## 2012-08-01 NOTE — Consult Note (Addendum)
Wound care follow-up:  Vac dressing changed to right arm.  Sutures intact surrounding wound, beefy red wound bed. Mod pink drainage in cannister, no odor. Wound 19X3X.2cm. Applied Mepitel and one piece black sponge to 75mm cont suction and trackpad placed to upper arm.  Pt has been very agitated and pulled off dressing to posterior arm.  Sutures not approximated, red with mod edema and pink drainage.  Applied Mepitel and one piece black sponge, bridged together to other dressing with 75mm cont suction.  Wound approx 12X2X.2cm  Acewrap applied to cover site to avoid pt pulling dressing off when agitated.  Plan dressing change Friday.   Cammie Mcgee, RN, MSN, [employer]  [phone]

## 2012-08-01 NOTE — Progress Notes (Signed)
Name: Martin Chapman MRN: [id number] DOB: 07-21-1991    LOS: 10  PULMONARY / CRITICAL CARE MEDICINE  HPI:  21 years old male with PMH relevant for heroin addiction, depression, anxiety and panic attacks. He overdosed on heroin and alcohol 10/19 PM  And 10/20 PM he was found unresponsive at about 2:00 pm. Pt now on vent, s/p compartment syndrome and release in OR of R shoulder muscles.   Events since admission: 10/20- Admitted to ICU 10/20 - Surgery with shoulder release and fasciotomy of right arm 10/21 - Started on CVVH>>d/c 10/22 10/22 - Return to OR for I&D with wound vac placement. Bicep excised  10/25 - Vac change 10/26 - diarrhea>>C diff positive 10/27 - extubated 10/27 - agitation, precedex started 10/29 - HD cath placement  Current Status: Continues to have agitation,  Haldol seems to work best  Vital Signs: Temp:  [98.2 F (36.8 C)-100.3 F (37.9 C)] 99 F (37.2 C) (10/30 0400) Pulse Rate:  [82-108] 107  (10/30 0500) Resp:  [5-31] 20  (10/30 0500) BP: (109-144)/(53-97) 137/94 mmHg (10/30 0500) SpO2:  [96 %-100 %] 99 % (10/30 0500) Weight:  [175 lb 0.7 oz (79.4 kg)] 175 lb 0.7 oz (79.4 kg) (10/30 0300)  Physical Examination: General - Asleep in bed, no distress. HEENT - NG in place Cardiac - s1s2 regular Chest - CTAB, no wheeze Abd - deferred Ext - Rt upper arm wrapped in ACE, wound vac in place. Good pulses all extremities. Neuro - Asleep and comfortable, did not awaken due to agitation when awake  Dg Chest [facility] 1 View  07/31/2012  *RADIOLOGY REPORT*  Clinical Data: Evaluate for pneumothorax.  Insertion of dialysis catheter.  PORTABLE CHEST - 1 VIEW  Comparison: Chest x-ray 07/29/2012.  Findings: There is a new left internal jugular PermCath and position with the tips terminating in the right atrium and at the superior cavoatrial junction. There is a left-sided internal jugular central venous catheter with tip terminating in the distal left innominate  vein.  Nasogastric tube has been withdrawn, now with tip near the gastroesophageal junction.  Lung volumes are low. No pneumothorax.  No acute consolidative airspace disease. Crowding of the pulmonary vasculature, accentuated by low lung volumes, without frank pulmonary edema.  No pleural effusions. Heart size is normal.  Mediastinal contours are unremarkable.  IMPRESSION: 1.  New left IJ PermCath appears properly located. 2.  No definite pneumothorax following dialysis catheter placement. 3.  Tip of nasogastric tube is now at the gastroesophageal junction and could be advanced at least 8 cm for more optimal placement. Additional support apparatus, as above.   Original Report Authenticated By: Florencia Reasons, M.D.     ASSESSMENT AND PLAN  PULMONARY No results found for this basename: PHART:5,PCO2:5,PCO2ART:5,PO2ART:5,HCO3:5,O2SAT:5 in the last 168 hours Ventilator Settings:   A:   1) Acute hypoxic respiratory failure likely secondary to aspiration pneumonia / pneumonitis P:   Extubated 10/27 with agitation Adjust oxygen to keep Spo2 > 92% No acute issues  CARDIOVASCULAR No results found for this basename: TROPONINI:5,LATICACIDVEN:5, O2SATVEN:5,PROBNP:5 in the last 168 hours  Lines:  Left IJ placed 07/22/12 >> R femoral HD cath 10/20>>10/30 Art line L radial 10/21>>10/24 Left tunneled IJ HD cath 10/29>>  Echo 10/22>>EF 55 to 60%  A:  1) Hypovolemic/septic shock>>off pressors 10/24. 2) Lactic acidosis likely secondary to shock and rhabdomyolysis>>resolved 3) Right upper extremity injury with compartment syndrome and possible vascular injury Note arch aortogram in OR normal.  No vascular deficit in RUE/shoulder  Elevated troponin P:  Wound care per ortho and wound care team Qtc 450   RENAL  Lab 08/01/12 0500 07/31/12 0500 07/30/12 0500 07/29/12 0456 07/28/12 0453 07/27/12 0450 07/26/12 0743  NA 138 142 138 138 143 -- --  K 3.4* 3.5 -- -- -- -- --  CL 99 102 100 100 104 --  --  CO2 25 27 25 27 28  -- --  BUN 52* 42* 79* 59* 70* -- --  CREATININE 11.90* 9.34* 13.46* 10.41* 12.32* -- --  CALCIUM 8.2* 8.4 8.7 8.6 8.2* -- --  MG -- 2.3 -- -- -- 2.5 2.7*  PHOS 3.9 4.6 6.5* 5.0* 5.0* -- --   Intake/Output      10/29 0701 - 10/30 0700   I.V. (mL/kg) 739.8 (9.3)   NG/GT 550   Total Intake(mL/kg) 1289.8 (16.2)   Drains 400   Stool 300   Total Output 700   Net +589.8        Foley:  07/22/12  A:   1) Acute renal failure s/p CRRT>>transitioned to intermittent HD 2) Rhabdomyolysis 3) Hyperkalemia>>>resolved P:   HD per renal; treatment today tolerating well Perm cath placement 10/29 F/u Renal panel in AM K 3.4 today   GASTROINTESTINAL  Lab 08/01/12 0500 07/31/12 0500 07/30/12 0500 07/29/12 0456 07/28/12 0453 07/27/12 0450  AST -- 116* 148* 166* 253* 550*  ALT -- 98* 102* 116* 140* 200*  ALKPHOS -- 56 58 70 66 84  BILITOT -- 0.3 0.3 0.3 0.4 0.4  PROT -- 5.0* 5.0* 5.3* 4.7* 4.8*  ALBUMIN 2.4* 2.2* 2.1*2.1* 2.2* 2.0* --    A:   1) Elevated LFT's and INR likely shock liver 2) Nutrition 3) Diarrhea 10/26 from C diff P:   Continue tube feeds Advance diet when MS allows, per SLP recs (unable to assess due to MS)  HEMATOLOGIC  Lab 08/01/12 0500 07/31/12 0500 07/30/12 0500 07/29/12 0456 07/28/12 0453  HGB 8.1* 7.6* 7.4* 8.5* 7.9*  HCT 22.6* 22.1* 21.6* 25.0* 22.8*  PLT 378 275 217 185 130*  INR -- -- -- -- --  APTT -- -- -- -- --   A:   1) Elevated INR likely from shock liver>>resolved 2) Anemia of critical illness 3) Thrombocytopenia>>improving P:  F/u CBC Epo and aranesp per renal Transfuse for Hb < 7   INFECTIOUS  Lab 08/01/12 0500 07/31/12 0500 07/30/12 0500 07/29/12 0456 07/28/12 0453  WBC 14.4* 12.9* 11.0* 9.1 9.4  PROCALCITON -- -- -- -- --   Cultures: Blood culture 10/20>>ng Urine culture 10/20>> negative BCx 10/23>> NGTD UCX 10/23 >>negative C diff 10/26>>Positive  Antibiotics: Zosyn 10/20>>10/28 IV Vancomycin  10/20>>10/26 PO vancomycin 10/26>>   A:   1) Aspiration pneumonia. 2) Rt arm compartment syndrome 3) Recurrent fever>>10/23 4) Diarrhea 10/26 from C diff P:   Continue po vancomycin   ENDOCRINE  Lab 08/01/12 0405 07/31/12 2349 07/31/12 1940 07/31/12 1605 07/31/12 1237  GLUCAP 98 104* 122* 104* 111*   A:   1) Hyperglycemia>>improved; no Hx of DM P:   POCT glucose TID   NEUROLOGIC  A:   1) Acute encephalopathy from OD; sedated on vent  P:   - Precedex continued for agitation, wean as tolerated - Scheduled Ativan 2mg  TID - Seroquel started 10/28>> Increase dose to 200mg  - Start Methadone 20mg  given chronic opioid use 10/29>>increase to 40mg  - Con't Haldol 5 q4 hours prn - -head Ct if no progress in 24 h, last 7/13 noted nml - Sitter at bedside,  as available, for agitation and suicide precautions   GLOBAL - Can transition out of ICU once off precedex  BEST PRACTICE / DISPOSITION - Level of Care:  ICU - Primary Service:  PCCM - Consultants:  Vascular surgery, orthopedic surgery, nephrology - Code Status:  Full code - Diet:  NPO - DVT Px:  SCD, Hep SQ - GI Px:  Protonix - Skin Integrity:  Intact - Social / Family:  No family at [employer] M. Hairford, M.D. 08/01/2012 6:26 AM    Care during the described time interval was provided by me and/or other providers on the critical care team.  I have reviewed this patient's available data, including medical history, events of note, physical examination and test results as part of my evaluation  CC time x  32 m  Cyril Mourning MD. Tonny Bollman. [place] [facility] Pager [phone] If no response call 319 [phone]

## 2012-08-02 ENCOUNTER — Encounter (HOSPITAL_COMMUNITY): Payer: Self-pay | Admitting: Surgery

## 2012-08-02 LAB — COMPREHENSIVE METABOLIC PANEL
ALT: 39 U/L (ref 0–53)
Albumin: 2.4 g/dL — ABNORMAL LOW (ref 3.5–5.2)
Calcium: 8.2 mg/dL — ABNORMAL LOW (ref 8.4–10.5)
GFR calc Af Amer: 10 mL/min — ABNORMAL LOW (ref >90)
Glucose, Bld: 97 mg/dL (ref 70–99)
Potassium: 3.8 mEq/L (ref 3.5–5.1)
Sodium: 139 mEq/L (ref 135–145)
Total Protein: 5.2 g/dL — ABNORMAL LOW (ref 6.0–8.3)

## 2012-08-02 LAB — GLUCOSE, CAPILLARY
Glucose-Capillary: 100 mg/dL — ABNORMAL HIGH (ref 70–99)
Glucose-Capillary: 100 mg/dL — ABNORMAL HIGH (ref 70–99)

## 2012-08-02 LAB — CBC
HCT: 21.8 % — ABNORMAL LOW (ref 39.0–52.0)
MCHC: 33.9 g/dL (ref 30.0–36.0)
Platelets: 349 10*3/uL (ref 150–400)
RDW: 15.1 % (ref 11.5–15.5)
WBC: 13.1 10*3/uL — ABNORMAL HIGH (ref 4.0–10.5)

## 2012-08-02 MED ORDER — LORAZEPAM 2 MG/ML IJ SOLN
2.0000 mg | Freq: Once | INTRAMUSCULAR | Status: AC
  Administered 2012-08-02: 2 mg via INTRAVENOUS

## 2012-08-02 MED ORDER — HEPARIN SODIUM (PORCINE) 1000 UNIT/ML DIALYSIS
1000.0000 [IU] | INTRAMUSCULAR | Status: DC | PRN
  Filled 2012-08-02: qty 1

## 2012-08-02 MED ORDER — HEPARIN SODIUM (PORCINE) 1000 UNIT/ML DIALYSIS
100.0000 [IU]/kg | INTRAMUSCULAR | Status: DC | PRN
  Filled 2012-08-02: qty 8

## 2012-08-02 MED ORDER — LIDOCAINE HCL (PF) 1 % IJ SOLN
5.0000 mL | INTRAMUSCULAR | Status: DC | PRN
  Filled 2012-08-02: qty 5

## 2012-08-02 MED ORDER — LIDOCAINE-PRILOCAINE 2.5-2.5 % EX CREA
1.0000 "application " | TOPICAL_CREAM | CUTANEOUS | Status: DC | PRN
  Filled 2012-08-02: qty 5

## 2012-08-02 MED ORDER — SODIUM CHLORIDE 0.9 % IV SOLN: 100.0000 mL | INTRAVENOUS | Status: DC | PRN

## 2012-08-02 MED ORDER — ALTEPLASE 2 MG IJ SOLR
2.0000 mg | Freq: Once | INTRAMUSCULAR | Status: AC | PRN
  Filled 2012-08-02: qty 2

## 2012-08-02 MED ORDER — PENTAFLUOROPROP-TETRAFLUOROETH EX AERO: 1.0000 "application " | INHALATION_SPRAY | CUTANEOUS | Status: DC | PRN

## 2012-08-02 MED ORDER — THIAMINE HCL 100 MG/ML IJ SOLN: 100.0000 mg | Freq: Every day | INTRAMUSCULAR | Status: DC

## 2012-08-02 MED ORDER — NEPRO/CARBSTEADY PO LIQD
237.0000 mL | ORAL | Status: DC | PRN
  Administered 2012-08-05: 237 mL via ORAL
  Filled 2012-08-02: qty 237

## 2012-08-02 MED ORDER — THIAMINE HCL 100 MG/ML IJ SOLN
100.0000 mg | Freq: Every day | INTRAMUSCULAR | Status: DC
  Administered 2012-08-02 – 2012-08-04 (×3): 100 mg via INTRAVENOUS
  Filled 2012-08-02 (×3): qty 1

## 2012-08-02 NOTE — Progress Notes (Signed)
Pt has remained agitated and combative since approximately 0100 this morning. Dr. Delton Coombes was called at 0400 and again at 10. At [phone] pt had pulled out his NG tube, managed to get himself prone and up in the bed. Dr. Delton Coombes ordered for another 2mg  Ativan. Pt is currently in bed with sitter at bedside. Will continue to watch closely and assist the sitter as needed.

## 2012-08-02 NOTE — Progress Notes (Signed)
Subjective: Interval History: none.  Objective: Vital signs in last 24 hours: Temp:  [98.4 F (36.9 C)-101.2 F (38.4 C)] 101.2 F (38.4 C) (10/31 0425) Pulse Rate:  [76-117] 90  (10/31 0600) Resp:  [14-21] 18  (10/31 0600) BP: (103-149)/(56-94) 149/85 mmHg (10/31 0600) SpO2:  [93 %-100 %] 98 % (10/31 0600) FiO2 (%):  [2 %] 2 % (10/30 1500) Weight:  [76.2 kg (167 lb 15.9 oz)-79 kg (174 lb 2.6 oz)] 79 kg (174 lb 2.6 oz) (10/31 0400) Weight change: -0.6 kg (-1 lb 5.2 oz)  Intake/Output from previous day: 10/30 0701 - 10/31 0700 In: 2012.9 [I.V.:697.9; NG/GT:1265; IV Piggyback:50] Out: 2500  Intake/Output this shift: Total I/O In: 980.1 [I.V.:375.1; NG/GT:605] Out: -   General appearance: confused, sedated Resp: rhonchi bilaterally Cardio: regular rate and rhythm and systolic murmur: holosystolic 2/6, blowing at apex GI: soft,pos bs, liver down 4 cm Extremities: PC, 1+ edema.  Lab Results:  Basename 08/02/12 0500 08/01/12 0500  WBC 13.1* 14.4*  HGB 7.4* 8.1*  HCT 21.8* 22.6*  PLT 349 378   BMET:  Basename 08/02/12 0500 08/01/12 0500  NA 139 138  K 3.8 3.4*  CL 102 99  CO2 28 25  GLUCOSE 97 100*  BUN 27* 52*  CREATININE 8.05* 11.90*  CALCIUM 8.2* 8.2*   No results found for this basename: PTH:2 in the last 72 hours Iron Studies: No results found for this basename: IRON,TIBC,TRANSFERRIN,FERRITIN in the last 72 hours  Studies/Results: Dg Chest Port 1 View  07/31/2012  *RADIOLOGY REPORT*  Clinical Data: Evaluate for pneumothorax.  Insertion of dialysis catheter.  PORTABLE CHEST - 1 VIEW  Comparison: Chest x-ray 07/29/2012.  Findings: There is a new left internal jugular PermCath and position with the tips terminating in the right atrium and at the superior cavoatrial junction. There is a left-sided internal jugular central venous catheter with tip terminating in the distal left innominate vein.  Nasogastric tube has been withdrawn, now with tip near the  gastroesophageal junction.  Lung volumes are low. No pneumothorax.  No acute consolidative airspace disease. Crowding of the pulmonary vasculature, accentuated by low lung volumes, without frank pulmonary edema.  No pleural effusions. Heart size is normal.  Mediastinal contours are unremarkable.  IMPRESSION: 1.  New left IJ PermCath appears properly located. 2.  No definite pneumothorax following dialysis catheter placement. 3.  Tip of nasogastric tube is now at the gastroesophageal junction and could be advanced at least 8 cm for more optimal placement. Additional support apparatus, as above.   Original Report Authenticated By: Florencia Reasons, M.D.     I have reviewed the patient's current medications.  Assessment/Plan: 1 AKI no urine & no function. Will do HD in am. Good control of solute, acid/base/K. 2 Enceph no change 3 Substance abuse 4 Anemia on epo/fe 5 Fever if persists, culture P HD in am, epo./fe    LOS: 11 days   Kerith Sherley L 08/02/2012,6:59 AM

## 2012-08-02 NOTE — Consult Note (Signed)
NEURO HOSPITALIST CONSULT NOTE    Reason for Consult:  Change in mental status  HPI:                                                                                                                                          Martin Chapman is an 21 y.o. male with a past history of heroin addiction, depression, anxiety and panic attacks. PAtient was brought to [facility] due to friends finding him unresponsive (around 2PM) overdosed on heroin and alcohol the night before. Per note from PCCM, At admission he was unresponsive with good response to Narcan. He was hypotensive with MAP;s in the 40's despite aggressive IVF resuscitation. Required Narcan drip to stay awake. His Lactate at admission is 10 and was hypothermic. While hospitalized on 10/20 patient had a fasciotomy and angiography by Dr Lajoyce Corners for right arm compartment syndrome with a right peri clavicular hematoma.  On 10/26 patient was diagnosed with C. Diff. On 10/27 patient was extubated and was noted to be agitated requiring Precedex. While hospitalized he has remained confused and agitated.   On consultation patient was initially on Precedex which was turned off. 45 minutes past prior to exam due to significant sedation.    Labs : (10/21-10/31) Creatinine 3.15 5.11,5.10, 9.57 12.32 10.41 13.46 9.34 11.90 8.05  Calcium 7.0 7.6,7.6 8.0 8.2 8.6 8.7 8.4 8.2 8.2  AST 1112 968 550 253 166 148 116 83  ALT 389 290 200 140 116 102 98 39  Blood and Urine culture negative.  Past Medical History  Diagnosis Date  . Anxiety   . Panic attacks   . Depression     Past Surgical History  Procedure Date  . Extraction of wisdom teeth   . Intraoperative arteriogram 07/22/2012    Procedure: INTRA OPERATIVE ARTERIOGRAM;  Surgeon: Sherren Kerns, MD;  Location: [facility] OR;  Service: Vascular;  Laterality: Left;  arch aortagram, second order catheterization right subclavian artery, ultrasound left groin  . Insertion of dialysis catheter  07/22/2012    Procedure: INSERTION OF DIALYSIS CATHETER;  Surgeon: Sherren Kerns, MD;  Location: [facility] OR;  Service: Vascular;  Laterality: Right;  . Fasciotomy 07/22/2012    Procedure: FASCIOTOMY;  Surgeon: Nadara Mustard, MD;  Location: [facility] OR;  Service: Orthopedics;  Laterality: Right;  . I&d extremity 07/24/2012    Procedure: IRRIGATION AND DEBRIDEMENT EXTREMITY;  Surgeon: Nadara Mustard, MD;  Location: [facility] OR;  Service: Orthopedics;  Laterality: Right;  Right Arm Irrigation and Debridement, VAC Application  . Application of wound vac 07/24/2012    Procedure: APPLICATION OF WOUND VAC;  Surgeon: Nadara Mustard, MD;  Location: [facility] OR;  Service: Orthopedics;  Laterality: Right;    Family History  Problem Relation Age of Onset  .  Hypertension Father      Social History:  reports that he has been smoking Cigarettes.  He has been smoking about 1 pack per day. He does not have any smokeless tobacco history on file. He reports that he drinks alcohol. He reports that he uses illicit drugs (Cocaine).  No Known Allergies  MEDICATIONS:                                                                                                                     Prior to Admission:  No prescriptions prior to admission   Scheduled:   . Chlorhexidine Gluconate Cloth  6 each Topical Q0600  . darbepoetin (ARANESP) injection - DIALYSIS  150 mcg Intravenous Q Sat-HD  . famotidine (PEPCID) IV  20 mg Intravenous Q1200  . ferric gluconate (FERRLECIT/NULECIT) IV  125 mg Intravenous Q T,Th,Sa-HD  . heparin subcutaneous  5,000 Units Subcutaneous Q8H  . LORazepam  2 mg Intravenous Once  . LORazepam  2 mg Oral TID  . methadone  40 mg Oral Daily  . multivitamin  1 tablet Oral QHS  . QUEtiapine  200 mg Oral BID  . saccharomyces boulardii  250 mg Oral BID  . vancomycin  500 mg Per Tube Q6H  . white petrolatum         ROS:                                                                                                                                        Unable to attain.   Blood pressure 133/81, pulse 90, temperature 99.7 F (37.6 C), temperature source Oral, resp. rate 0, height 5\' 7"  (1.702 m), weight 79 kg (174 lb 2.6 oz), SpO2 99.00%.   Neurologic Examination:                                                                                                      Mental Status: Patient was initially on Precedex. This was stopped.  Patient was very  hard to arouse, would open his eyes to voice and sternal rub but only briefly.  HE knows he is in the hospital but cannot give me exact date. HE was able to state it was 2013.  When asked how many quarters are in a dollar he was able to answer 4 but this took a prolonged period of thought.  Cranial Nerves: II: Discs difficult to visualize bilaterally; pupils equal, round, reactive to light  III,IV, VI: extra-ocular motions intact bilaterally, though when unconscious they are dysconjugate--will look toward voice V,VII: smile symmetric, winces to pain.  VIII: hearing normal bilaterally IX,X: gag reflex present XI: bilateral shoulder shrug XII: midline tongue extension Motor: Will raise both arms antigravity both to command and purposefully.  With draws legs with verbal command and from noxious stimuli antigravity.  Tone and bulk:normal tone throughout; no atrophy noted Sensory: winces to noxious stimuli bilaterally in upper arms, withdraws from inner thigh noxious stimuli bilaterally.  Able to identify when his feet are touched and does not extinguish.  Deep Tendon Reflexes: 2+ and symmetric throughout. No AJ noted Plantars: Right: downgoing   Left: downgoing Cerebellar: Unable to examine 2/2  CV: pulses palpable throughout     No results found for this basename: cbc, bmp, coags, chol, tri, ldl, hga1c    Results for orders placed during the hospital encounter of 07/22/12 (from the past 48 hour(s))  GLUCOSE, CAPILLARY     Status: Abnormal   Collection  Time   07/31/12  4:05 PM      Component Value Range Comment   Glucose-Capillary 104 (*) 70 - 99 mg/dL   GLUCOSE, CAPILLARY     Status: Abnormal   Collection Time   07/31/12  7:40 PM      Component Value Range Comment   Glucose-Capillary 122 (*) 70 - 99 mg/dL   GLUCOSE, CAPILLARY     Status: Abnormal   Collection Time   07/31/12 11:49 PM      Component Value Range Comment   Glucose-Capillary 104 (*) 70 - 99 mg/dL   GLUCOSE, CAPILLARY     Status: Normal   Collection Time   08/01/12  4:05 AM      Component Value Range Comment   Glucose-Capillary 98  70 - 99 mg/dL   CBC     Status: Abnormal   Collection Time   08/01/12  5:00 AM      Component Value Range Comment   WBC 14.4 (*) 4.0 - 10.5 K/uL    RBC 2.66 (*) 4.22 - 5.81 MIL/uL    Hemoglobin 8.1 (*) 13.0 - 17.0 g/dL    HCT 16.1 (*) 09.6 - 52.0 %    MCV 85.0  78.0 - 100.0 fL    MCH 30.5  26.0 - 34.0 pg    MCHC 35.8  30.0 - 36.0 g/dL    RDW 04.5  40.9 - 81.1 %    Platelets 378  150 - 400 K/uL   RENAL FUNCTION PANEL     Status: Abnormal   Collection Time   08/01/12  5:00 AM      Component Value Range Comment   Sodium 138  135 - 145 mEq/L    Potassium 3.4 (*) 3.5 - 5.1 mEq/L    Chloride 99  96 - 112 mEq/L    CO2 25  19 - 32 mEq/L    Glucose, Bld 100 (*) 70 - 99 mg/dL    BUN 52 (*) 6 - 23 mg/dL  Creatinine, Ser 11.90 (*) 0.50 - 1.35 mg/dL    Calcium 8.2 (*) 8.4 - 10.5 mg/dL    Phosphorus 3.9  2.3 - 4.6 mg/dL    Albumin 2.4 (*) 3.5 - 5.2 g/dL    GFR calc non Af Amer 5 (*) >90 mL/min    GFR calc Af Amer 6 (*) >90 mL/min   GLUCOSE, CAPILLARY     Status: Abnormal   Collection Time   08/01/12  7:41 AM      Component Value Range Comment   Glucose-Capillary 101 (*) 70 - 99 mg/dL   GLUCOSE, CAPILLARY     Status: Abnormal   Collection Time   08/01/12 11:38 AM      Component Value Range Comment   Glucose-Capillary 103 (*) 70 - 99 mg/dL   GLUCOSE, CAPILLARY     Status: Abnormal   Collection Time   08/01/12  3:58 PM       Component Value Range Comment   Glucose-Capillary 113 (*) 70 - 99 mg/dL   GLUCOSE, CAPILLARY     Status: Normal   Collection Time   08/01/12  8:11 PM      Component Value Range Comment   Glucose-Capillary 94  70 - 99 mg/dL   GLUCOSE, CAPILLARY     Status: Abnormal   Collection Time   08/02/12 12:25 AM      Component Value Range Comment   Glucose-Capillary 116 (*) 70 - 99 mg/dL    Comment 1 Documented in Chart      Comment 2 Notify RN     GLUCOSE, CAPILLARY     Status: Abnormal   Collection Time   08/02/12  4:10 AM      Component Value Range Comment   Glucose-Capillary 100 (*) 70 - 99 mg/dL    Comment 1 Documented in Chart      Comment 2 Notify RN     CBC     Status: Abnormal   Collection Time   08/02/12  5:00 AM      Component Value Range Comment   WBC 13.1 (*) 4.0 - 10.5 K/uL    RBC 2.56 (*) 4.22 - 5.81 MIL/uL    Hemoglobin 7.4 (*) 13.0 - 17.0 g/dL    HCT 16.1 (*) 09.6 - 52.0 %    MCV 85.2  78.0 - 100.0 fL    MCH 28.9  26.0 - 34.0 pg    MCHC 33.9  30.0 - 36.0 g/dL    RDW 04.5  40.9 - 81.1 %    Platelets 349  150 - 400 K/uL   PHOSPHORUS     Status: Normal   Collection Time   08/02/12  5:00 AM      Component Value Range Comment   Phosphorus 3.0  2.3 - 4.6 mg/dL   COMPREHENSIVE METABOLIC PANEL     Status: Abnormal   Collection Time   08/02/12  5:00 AM      Component Value Range Comment   Sodium 139  135 - 145 mEq/L    Potassium 3.8  3.5 - 5.1 mEq/L    Chloride 102  96 - 112 mEq/L    CO2 28  19 - 32 mEq/L    Glucose, Bld 97  70 - 99 mg/dL    BUN 27 (*) 6 - 23 mg/dL DELTA CHECK NOTED   Creatinine, Ser 8.05 (*) 0.50 - 1.35 mg/dL    Calcium 8.2 (*) 8.4 - 10.5 mg/dL    Total Protein 5.2 (*)  6.0 - 8.3 g/dL    Albumin 2.4 (*) 3.5 - 5.2 g/dL    AST 83 (*) 0 - 37 U/L    ALT 39  0 - 53 U/L    Alkaline Phosphatase 77  39 - 117 U/L    Total Bilirubin 0.3  0.3 - 1.2 mg/dL    GFR calc non Af Amer 8 (*) >90 mL/min    GFR calc Af Amer 10 (*) >90 mL/min   GLUCOSE, CAPILLARY      Status: Abnormal   Collection Time   08/02/12  8:26 AM      Component Value Range Comment   Glucose-Capillary 100 (*) 70 - 99 mg/dL   GLUCOSE, CAPILLARY     Status: Normal   Collection Time   08/02/12 12:09 PM      Component Value Range Comment   Glucose-Capillary 93  70 - 99 mg/dL      Assessment/Plan:  21 YO male found down and unresponsive, hypothermic and hypotensive S/P OD. Since hospitalization patient has required HD for renal failure, AST/ALT have been elevated secondary to liver shock,  C-diff positive and aspiration pneumonia. I suspect that his AMS is a multifactorial delirium including possible hypoxic encephalopathy, renal failure, and hepatic insult. An MRI may be able to identify areas that suffered hypoxia. I would check for other contributing factors to AMS.   Recommend: 1) Thiamine 100 mg daily 2) Labs: B12, Thiamine, ammonia, TSH 3) Imaging: MRI brain without contrast 4) EEG 5) Continue to treat underlying metabolic, renal, hepatic and infectious processes.    Will continue to follow. Felicie Morn PA-C [facility] [phone]  08/02/2012, 1:27 PM  I have seen and evaluated the patient. I have reviewed the above note and made appropriate changes. MRI, EEG labs as described above.   Ritta Slot, MD [facility] [phone]  If 7pm- 7am, please page neurology on call at [phone].

## 2012-08-02 NOTE — Progress Notes (Signed)
precedex drip interrupted, stopped by MD at approximately 1430 for exam, drip remains off per MD, CVC port flushed, suicide sitter remains at bedside, Berle Mull RN

## 2012-08-02 NOTE — Progress Notes (Signed)
Speech Language Pathology Dysphagia Treatment Patient Details Name: Martin Chapman MRN: [id number] DOB: 01/14/1991 Today's Date: 08/02/2012 Time: 0945-1000 SLP Time Calculation (min): 15 min  Assessment / Plan / Recommendation Clinical Impression  Treatment focused on differential diagnosis of dysphagia to determine readiness for a po diet.  Patient lethargic, confused, restless but able to sustain attention for brief periods of time with clinician cues for clinician provided po trials. Patient able to consume all trials largely without s/s of aspiration. One coughing episode noted with thin liquids out of many trials, likely due to large bolus size. Otherwise, patient appears to be protecting airway. Aspiration risk increased due to fluctuations in mentation, especially with sedation requirements. Recommend initiation of a regular diet when fully alert. SLP will f/u briefly for tolerance.      Diet Recommendation  Initiate / Change Diet: Regular;Thin liquid       Pertinent Vitals/Pain n/a   Swallowing Goals  SLP Swallowing Goals Patient will consume recommended diet without observed clinical signs of aspiration with: Maximum assistance Swallow Study Goal #1 - Progress: Not Met Patient will utilize recommended strategies during swallow to increase swallowing safety with: Maximum assistance Swallow Study Goal #2 - Progress: Not met Goal #3: Patient will tolerate trials of sips/chips without s/s of aspiration,  after oral care when alert and able to cooperate Swallow Study Goal #3 - Progress: Met  General Temperature Spikes Noted: Yes Respiratory Status: Supplemental O2 delivered via (comment) (nasal cannula) Behavior/Cognition: Lethargic;Confused;Impulsive;Distractible;Requires cueing;Decreased sustained attention (restless) Oral Cavity - Dentition: Adequate natural dentition Patient Positioning: Upright in bed  Oral Cavity - Oral Hygiene Does patient have any of the following  "at risk" factors?: Nutritional status - dependent feeder Patient is HIGH RISK - Oral Care Protocol followed (see row info): Yes Patient is AT RISK - Oral Care Protocol followed (see row info): Yes   Dysphagia Treatment Treatment focused on: Upgraded PO texture trials Treatment Methods/Modalities: Skilled observation;Differential diagnosis Patient observed directly with PO's: Yes Type of PO's observed: Regular;Dysphagia 1 (puree);Thin liquids Feeding: Total assist Liquids provided via: Straw Pharyngeal Phase Signs & Symptoms: Suspected delayed swallow initiation;Immediate cough Type of cueing: Tactile Amount of cueing: Maximal   GO   Martin Lango MA, CCC-SLP [phone]   Martin Chapman Martin Chapman 08/02/2012, 10:05 AM

## 2012-08-02 NOTE — Progress Notes (Signed)
PT Cancellation Note  Patient Details Name: Martin Chapman MRN: [id number] DOB: 09-11-1991   Cancelled Treatment:    Reason Eval/Treat Not Completed: Patient's level of consciousness;Patient not medically ready. Pt is currently still on Precedex strip and sedated for agitation. Will attempt evaluation tomorrow pending medical stability.    Milana Kidney 08/02/2012, 9:22 AM

## 2012-08-02 NOTE — Progress Notes (Signed)
Name: Martin Chapman MRN: [id number] DOB: [birth year]    LOS: 11  PULMONARY / CRITICAL CARE MEDICINE  HPI:  21 years old male with PMH relevant for heroin addiction, depression, anxiety and panic attacks. He overdosed on heroin and alcohol 10/19 PM  And 10/20 PM he was found unresponsive at about 2:00 pm. Pt now on vent, s/p compartment syndrome and release in OR of R shoulder muscles.   Events since admission: 10/20- Admitted to ICU 10/20 - Surgery with shoulder release and fasciotomy of right arm 10/21 - Started on CVVH>>d/c 10/22 10/22 - Return to OR for I&D with wound vac placement. Bicep excised  10/25 - Vac change 10/26 - diarrhea>>C diff positive 10/27 - extubated 10/27 - agitation, precedex started 10/29 - HD cath placement  Current Status: Continues to have agitation,on precedex gtt,   Haldol seems to work best  Vital Signs: Temp:  [98.4 F (36.9 C)-101.2 F (38.4 C)] 99.7 F (37.6 C) (10/31 1225) Pulse Rate:  [76-117] 90  (10/31 1200) Resp:  [0-21] 0  (10/31 1200) BP: (105-149)/(56-94) 133/81 mmHg (10/31 1200) SpO2:  [93 %-100 %] 99 % (10/31 1200) FiO2 (%):  [2 %] 2 % (10/30 1500) Weight:  [167 lb 15.9 oz (76.2 kg)-174 lb 2.6 oz (79 kg)] 174 lb 2.6 oz (79 kg) (10/31 0400)  Physical Examination: General - Asleep in bed, no distress. HEENT - NG in place Cardiac - s1s2 regular Chest - CTAB, no wheeze Abd - deferred Ext - Rt upper arm wrapped in ACE, wound vac in place. Good pulses all extremities. Neuro - Asleep and comfortable, did not awaken due to agitation when awake  No results found.  ASSESSMENT AND PLAN  PULMONARY No results found for this basename: PHART:5,PCO2:5,PCO2ART:5,PO2ART:5,HCO3:5,O2SAT:5 in the last 168 hours Ventilator Settings: n/a  A:   1) Acute hypoxic respiratory failure likely secondary to aspiration pneumonia / pneumonitis P:   Extubated 10/27 with agitation  Adjust oxygen to keep Spo2 > 92%   CARDIOVASCULAR No results  found for this basename: TROPONINI:5,LATICACIDVEN:5, O2SATVEN:5,PROBNP:5 in the last 168 hours  Lines:  Left IJ placed 07/22/12 >> R femoral HD cath 10/20>>10/30 Art line L radial 10/21>>10/24 Left tunneled IJ HD cath 10/29>>  Echo 10/22>>EF 55 to 60%  A:  1) Hypovolemic/septic shock>>off pressors 10/24. 2) Lactic acidosis likely secondary to shock and rhabdomyolysis>>resolved 3) Right upper extremity injury with compartment syndrome and possible vascular injury Note arch aortogram in OR normal.  No vascular deficit in RUE/shoulder Elevated troponin P:  Wound care per ortho and wound care team 10/31 Qtc 447   RENAL  Lab 08/02/12 0500 08/01/12 0500 07/31/12 0500 07/30/12 0500 07/29/12 0456 07/27/12 0450  NA 139 138 142 138 138 --  K 3.8 3.4* -- -- -- --  CL 102 99 102 100 100 --  CO2 28 25 27 25 27  --  BUN 27* 52* 42* 79* 59* --  CREATININE 8.05* 11.90* 9.34* 13.46* 10.41* --  CALCIUM 8.2* 8.2* 8.4 8.7 8.6 --  MG -- -- 2.3 -- -- 2.5  PHOS 3.0 3.9 4.6 6.5* 5.0* --   Intake/Output      10/30 0701 - 10/31 0700 10/31 0701 - 11/01 0700   I.V. (mL/kg) 732 (9.3) 184.3 (2.3)   NG/GT 1320 250   IV Piggyback 50 100   Total Intake(mL/kg) 2102 (26.6) 534.3 (6.8)   Drains     Other 2500    Stool     Total Output 2500  Net -398.1 +534.3         Foley:  07/22/12  A:   1) Acute renal failure s/p CRRT>>transitioned to intermittent HD 2) Rhabdomyolysis 3) Hyperkalemia>>>resolved P:   HD per renal Perm cath placement 10/29 F/u Renal panel in AM K 3.8 today   GASTROINTESTINAL  Lab 08/02/12 0500 08/01/12 0500 07/31/12 0500 07/30/12 0500 07/29/12 0456 07/28/12 0453  AST 83* -- 116* 148* 166* 253*  ALT 39 -- 98* 102* 116* 140*  ALKPHOS 77 -- 56 58 70 66  BILITOT 0.3 -- 0.3 0.3 0.3 0.4  PROT 5.2* -- 5.0* 5.0* 5.3* 4.7*  ALBUMIN 2.4* 2.4* 2.2* 2.1*2.1* 2.2* --    A:   1) Elevated LFT's and INR likely shock liver 2) Nutrition 3) Diarrhea 10/26 from C diff P:     Continue tube feeds Advance diet when MS allows, per SLP recs (unable to assess due to MS)  HEMATOLOGIC  Lab 08/02/12 0500 08/01/12 0500 07/31/12 0500 07/30/12 0500 07/29/12 0456  HGB 7.4* 8.1* 7.6* 7.4* 8.5*  HCT 21.8* 22.6* 22.1* 21.6* 25.0*  PLT 349 378 275 217 185  INR -- -- -- -- --  APTT -- -- -- -- --   A:   1) Elevated INR likely from shock liver>>resolved 2) Anemia of critical illness 3) Thrombocytopenia>>improving P:  F/u CBC Epo and aranesp per renal Transfuse for Hb < 7   INFECTIOUS  Lab 08/02/12 0500 08/01/12 0500 07/31/12 0500 07/30/12 0500 07/29/12 0456  WBC 13.1* 14.4* 12.9* 11.0* 9.1  PROCALCITON -- -- -- -- --   Cultures: Blood culture 10/20>>ng Urine culture 10/20>> negative BCx 10/23>> NGTD UCX 10/23 >>negative C diff 10/26>>Positive  Antibiotics: Zosyn 10/20>>10/28 IV Vancomycin 10/20>>10/26 PO vancomycin 10/26>>   A:   1) Aspiration pneumonia. 2) Rt arm compartment syndrome 3) Recurrent fever>>10/23 4) Diarrhea 10/26 from C diff P:   Continue po vancomycin   ENDOCRINE  Lab 08/02/12 1209 08/02/12 0826 08/02/12 0410 08/02/12 0025 08/01/12 2011  GLUCAP 93 100* 100* 116* 94   A:   1) Hyperglycemia>>improved; no Hx of DM P:   POCT glucose TID   NEUROLOGIC  A:   1) Acute encephalopathy from OD; sedated on vent  P:   - Precedex continued for agitation, wean as tolerated - Scheduled Ativan 1-2mg  every hour prn - Seroquel started 10/28>> Increase dose to 200mg  - Start Methadone 20mg  given chronic opioid use 10/29>>con't 40mg  - Con't Haldol 5 q4 hours prn - Head Ct today - Will consult Neuro today for further recs - Sitter at bedside, as available, for agitation and suicide precautions   GLOBAL - Can transition out of ICU once off precedex  BEST PRACTICE / DISPOSITION - Level of Care:  ICU - Primary Service:  PCCM - Consultants:  Vascular surgery, orthopedic surgery, nephrology - Code Status:  Full code - Diet:  NPO -  DVT Px:  SCD, Hep SQ - GI Px:  Protonix - Skin Integrity:  Intact - Social / Family:  No family at [employer] M. Hairford, M.D. 08/02/2012 12:58 PM    Care during the described time interval was provided by me and/or other providers on the critical care team.  I have reviewed this patient's available data, including medical history, events of note, physical examination and test results as part of my evaluation  CC time x  32 m  Cyril Mourning MD. Tonny Bollman. [place] [facility] Pager [phone] If no response call [phone]

## 2012-08-03 ENCOUNTER — Inpatient Hospital Stay (HOSPITAL_COMMUNITY): Payer: BC Managed Care – PPO

## 2012-08-03 DIAGNOSIS — T50901A Poisoning by unspecified drugs, medicaments and biological substances, accidental (unintentional), initial encounter: Secondary | ICD-10-CM

## 2012-08-03 LAB — CBC
HCT: 21.8 % — ABNORMAL LOW (ref 39.0–52.0)
Hemoglobin: 7.3 g/dL — ABNORMAL LOW (ref 13.0–17.0)
MCH: 29.2 pg (ref 26.0–34.0)
MCV: 87.2 fL (ref 78.0–100.0)
RBC: 2.5 MIL/uL — ABNORMAL LOW (ref 4.22–5.81)
WBC: 12.7 10*3/uL — ABNORMAL HIGH (ref 4.0–10.5)

## 2012-08-03 LAB — RENAL FUNCTION PANEL
CO2: 26 mEq/L (ref 19–32)
Calcium: 8.4 mg/dL (ref 8.4–10.5)
Chloride: 102 mEq/L (ref 96–112)
Creatinine, Ser: 11.04 mg/dL — ABNORMAL HIGH (ref 0.50–1.35)
GFR calc non Af Amer: 6 mL/min — ABNORMAL LOW (ref >90)
Glucose, Bld: 119 mg/dL — ABNORMAL HIGH (ref 70–99)

## 2012-08-03 LAB — GLUCOSE, CAPILLARY
Glucose-Capillary: 115 mg/dL — ABNORMAL HIGH (ref 70–99)
Glucose-Capillary: 101 mg/dL — ABNORMAL HIGH (ref 70–99)
Glucose-Capillary: 99 mg/dL (ref 70–99)

## 2012-08-03 MED ORDER — ACETAMINOPHEN 160 MG/5ML PO SOLN
500.0000 mg | Freq: Four times a day (QID) | ORAL | Status: DC | PRN
  Administered 2012-08-07 – 2012-08-20 (×6): 500 mg via ORAL
  Filled 2012-08-03: qty 20.3
  Filled 2012-08-03 (×6): qty 15.6
  Filled 2012-08-03: qty 20.3
  Filled 2012-08-03 (×3): qty 15.6

## 2012-08-03 MED ORDER — FENTANYL CITRATE 0.05 MG/ML IJ SOLN
50.0000 ug | Freq: Once | INTRAMUSCULAR | Status: AC
  Administered 2012-08-03: 50 ug via INTRAVENOUS

## 2012-08-03 MED ORDER — INFLUENZA VIRUS VACC SPLIT PF IM SUSP
0.5000 mL | INTRAMUSCULAR | Status: AC
  Administered 2012-08-04: 0.5 mL via INTRAMUSCULAR
  Filled 2012-08-03: qty 0.5

## 2012-08-03 NOTE — Consult Note (Signed)
Wound care follow-up:  Vac changed to right arm fasciotomy sites to anterior and posterior arm.  Both sites beefy red, mod pink drainage in cannister, no odor.  Mepitel contact layer and one piece black foam applied to each site and bridged together to one track pad at 75mm cont suction.  Plan to change dressing Mon.  Covered with Ace wrap so pt will not pull off dressing again.   Cammie Mcgee, RN, MSN, [employer]  [phone]

## 2012-08-03 NOTE — Progress Notes (Signed)
EEG completed as ordered at bedside with no family members in room just sitter for suicidal precautions.

## 2012-08-03 NOTE — Progress Notes (Signed)
Subjective: Interval History: none.  Objective: Vital signs in last 24 hours: Temp:  [97.5 F (36.4 C)-101 F (38.3 C)] 101 F (38.3 C) (11/01 0424) Pulse Rate:  [73-117] 73  (11/01 0710) Resp:  [0-20] 13  (11/01 0710) BP: (112-141)/(64-87) 118/77 mmHg (11/01 0710) SpO2:  [96 %-100 %] 100 % (11/01 0710) Weight:  [81.2 kg (179 lb 0.2 oz)] 81.2 kg (179 lb 0.2 oz) (11/01 0648) Weight change: 2.4 kg (5 lb 4.7 oz)  Intake/Output from previous day: 10/31 0701 - 11/01 0700 In: 2276.8 [I.V.:816.8; NG/GT:1360; IV Piggyback:100] Out: 20 [Stool:20] Intake/Output this shift:    General appearance: sedated, opens eyes,not purposeful Resp: diminished breath sounds bilaterally and rhonchi bilaterally Chest wall: no tenderness, L IJ Cath Cardio: S1, S2 normal and systolic murmur: holosystolic 2/6, blowing at apex GI: pos bs, liver down 5 cm  Lab Results:  Basename 08/03/12 0459 08/02/12 0500  WBC 12.7* 13.1*  HGB 7.3* 7.4*  HCT 21.8* 21.8*  PLT 381 349   BMET:  Basename 08/03/12 0459 08/02/12 0500  NA 140 139  K 3.9 3.8  CL 102 102  CO2 26 28  GLUCOSE 119* 97  BUN 41* 27*  CREATININE 11.04* 8.05*  CALCIUM 8.4 8.2*   No results found for this basename: PTH:2 in the last 72 hours Iron Studies: No results found for this basename: IRON,TIBC,TRANSFERRIN,FERRITIN in the last 72 hours  Studies/Results: No results found.  I have reviewed the patient's current medications.  Assessment/Plan: 1 AKI oliguric, dialysis dependent.  Mild vol xs. Solute/acidbase/k ok.  HD today 2 OD 3 Enceph still an issue 4 Anemia on epo/fe follow. 5 fever P HD, epo, fe, TF    LOS: 12 days   Gray Maugeri L 08/03/2012,7:19 AM

## 2012-08-03 NOTE — Progress Notes (Addendum)
NEURO HOSPITALIST PROGRESS NOTE   SUBJECTIVE:                                                                                                                        Patient is currently hooked up to dialysis, He will easily awken to voice and to tactile stimuli.  Moving all extremities spontaneous and purposeful. Cannot tell me where he is but continues to state he "should not drive".   OBJECTIVE:                                                                                                                           Vital signs in last 24 hours: Temp:  [97.5 F (36.4 C)-101 F (38.3 C)] 98.2 F (36.8 C) (11/01 0815) Pulse Rate:  [72-117] 87  (11/01 0930) Resp:  [0-19] 12  (11/01 0930) BP: (112-141)/(64-87) 129/78 mmHg (11/01 0930) SpO2:  [96 %-100 %] 100 % (11/01 0930) Weight:  [81.2 kg (179 lb 0.2 oz)] 81.2 kg (179 lb 0.2 oz) (11/01 4540)  Intake/Output from previous day: 10/31 0701 - 11/01 0700 In: 2347.8 [I.V.:832.8; NG/GT:1415; IV Piggyback:100] Out: 20 [Stool:20] Intake/Output this shift: Total I/O In: 71 [I.V.:16; NG/GT:55] Out: -  Nutritional status: NPO  Past Medical History  Diagnosis Date  . Anxiety   . Panic attacks   . Depression     Neurologic ROS negative with exception of above. ENT: he has some small vesicular looking lesions just to the right of his mouth.  Musculoskeletal ROS none  Neurologic Exam:  Mental Status: He will easily awken to voice and to tactile stimuli.  Moving all extremities spontaneous and purposeful. Cannot tell me where he is but continues to state he "should not drive".  Speech fluent without evidence of aphasia.   Cranial Nerves: II: Visual fields grossly shows positive to threat with blinking, pupils equal, round, reactive to light  III,IV, VI: ptosis not present, extra-ocular motions intact bilaterally V,VII: Face symmetric, facial light touch sensation normal bilaterally VIII: hearing  normal bilaterally IX,X: gag reflex present XI: bilateral shoulder shrug XII: midline tongue extension Motor: Moves all extremities purposefully and antigravity.   Sensory: withdraws to pain in all extremities. Today he shows very brisk withdrawl to plantar  stimulation.  Deep Tendon Reflexes: 2+ bilateral UE, 3+ bilateral KJ and AJ.  With quick ankle jerk he shows 3-4 beats clonus in ankle right> left. Plantars: Right: downgoing   Left: downgoing  CV: pulses palpable throughout     Lab Results: Results for orders placed during the hospital encounter of 07/22/12 (from the past 24 hour(s))  GLUCOSE, CAPILLARY     Status: Normal   Collection Time   08/02/12 12:09 PM      Component Value Range   Glucose-Capillary 93  70 - 99 mg/dL  VITAMIN Z61     Status: Normal   Collection Time   08/02/12  4:15 PM      Component Value Range   Vitamin B-12 454  211 - 911 pg/mL  TSH     Status: Normal   Collection Time   08/02/12  4:15 PM      Component Value Range   TSH 4.067  0.350 - 4.500 uIU/mL  GLUCOSE, CAPILLARY     Status: Abnormal   Collection Time   08/02/12  4:26 PM      Component Value Range   Glucose-Capillary 103 (*) 70 - 99 mg/dL  AMMONIA     Status: Normal   Collection Time   08/02/12  6:35 PM      Component Value Range   Ammonia 16  11 - 60 umol/L  GLUCOSE, CAPILLARY     Status: Normal   Collection Time   08/02/12  7:48 PM      Component Value Range   Glucose-Capillary 96  70 - 99 mg/dL  GLUCOSE, CAPILLARY     Status: Normal   Collection Time   08/03/12 12:20 AM      Component Value Range   Glucose-Capillary 99  70 - 99 mg/dL   Comment 1 Documented in Chart     Comment 2 Notify RN    GLUCOSE, CAPILLARY     Status: Abnormal   Collection Time   08/03/12  4:11 AM      Component Value Range   Glucose-Capillary 112 (*) 70 - 99 mg/dL   Comment 1 Documented in Chart     Comment 2 Notify RN    CBC     Status: Abnormal   Collection Time   08/03/12  4:59 AM      Component  Value Range   WBC 12.7 (*) 4.0 - 10.5 K/uL   RBC 2.50 (*) 4.22 - 5.81 MIL/uL   Hemoglobin 7.3 (*) 13.0 - 17.0 g/dL   HCT 09.6 (*) 04.5 - 40.9 %   MCV 87.2  78.0 - 100.0 fL   MCH 29.2  26.0 - 34.0 pg   MCHC 33.5  30.0 - 36.0 g/dL   RDW 81.1 (*) 91.4 - 78.2 %   Platelets 381  150 - 400 K/uL  RENAL FUNCTION PANEL     Status: Abnormal   Collection Time   08/03/12  4:59 AM      Component Value Range   Sodium 140  135 - 145 mEq/L   Potassium 3.9  3.5 - 5.1 mEq/L   Chloride 102  96 - 112 mEq/L   CO2 26  19 - 32 mEq/L   Glucose, Bld 119 (*) 70 - 99 mg/dL   BUN 41 (*) 6 - 23 mg/dL   Creatinine, Ser 95.62 (*) 0.50 - 1.35 mg/dL   Calcium 8.4  8.4 - 13.0 mg/dL   Phosphorus 6.8 (*) 2.3 - 4.6 mg/dL  Albumin 2.5 (*) 3.5 - 5.2 g/dL   GFR calc non Af Amer 6 (*) >90 mL/min   GFR calc Af Amer 7 (*) >90 mL/min  GLUCOSE, CAPILLARY     Status: Abnormal   Collection Time   08/03/12  8:41 AM      Component Value Range   Glucose-Capillary 115 (*) 70 - 99 mg/dL   Lipid Panel No results found for this basename: CHOL,TRIG,HDL,CHOLHDL,VLDL,LDLCALC in the last 72 hours  Studies/Results: No results found.  MEDICATIONS                                                                                                                        Scheduled:   . Chlorhexidine Gluconate Cloth  6 each Topical Q0600  . darbepoetin (ARANESP) injection - DIALYSIS  150 mcg Intravenous Q Sat-HD  . famotidine (PEPCID) IV  20 mg Intravenous Q1200  . ferric gluconate (FERRLECIT/NULECIT) IV  125 mg Intravenous Q T,Th,Sa-HD  . heparin subcutaneous  5,000 Units Subcutaneous Q8H  . LORazepam  2 mg Oral TID  . methadone  40 mg Oral Daily  . multivitamin  1 tablet Oral QHS  . QUEtiapine  200 mg Oral BID  . saccharomyces boulardii  250 mg Oral BID  . thiamine  100 mg Intravenous Daily  . vancomycin  500 mg Per Tube Q6H  . DISCONTD: thiamine  100 mg Intravenous Daily    ASSESSMENT/PLAN:                                                                                                                Patient Active Hospital Problem List:  Encephalopathy (07/30/2012)   21 YO male found down and unresponsive, hypothermic and hypotensive S/P OD. Suspect that his AMS is a multifactorial delirium including possible hypoxic encephalopathy, renal failure, and hepatic insult.MRI and EEG are pending.  Today he is easier to arouse and moving more purposefully on exam. TSH, B12, ammonia all normal. 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 Given this in the setting of AMS, I feel that ruling out HSV encephalitis is warrented.   Recommend:   1) Imaging: MRI brain without contrast  2) EEG --after dialysis 3) Continue to treat underlying metabolic, renal, hepatic and infectious processes 4) Would consider LP, though my index of suspicion is relatively low. I would confirm no contraindication for LP with coags.     Felicie Morn PA-C [facility] [phone]  08/03/2012, 9:54 AM    I have seen and evaluated the patient. I have reviewed the above note and made appropriate changes. AMS in the setting of hypoxia, renal failure, hepatic insult, and infection. MRI, EEG, LP if not contraindicated.   Ritta Slot, MD [facility] [phone]  If 7pm- 7am, please page neurology on call at [phone].

## 2012-08-03 NOTE — Progress Notes (Signed)
Nutrition Follow-up  Intervention:    Continue Nepro via NG tube at 55 ml/h to provide 2376 kcals, 107 gm protein, 960 ml free water daily.  Assessment:   Discussed patient in rounds this morning.  Tolerating TF well at goal rate.  Wound VAC is in place.  WOC RN is following for wound care.  Patient continues to receive HD and it is suspected that he will need this for the long term.  Patient remains confused and disoriented.  Unable to safely take PO's at this time.    Diet Order: NPO; Nepro at 55 ml/h providing 2376 kcals, 107 gm protein, 960 ml free water daily  Meds: Scheduled Meds:    . Chlorhexidine Gluconate Cloth  6 each Topical Q0600  . darbepoetin (ARANESP) injection - DIALYSIS  150 mcg Intravenous Q Sat-HD  . famotidine (PEPCID) IV  20 mg Intravenous Q1200  . ferric gluconate (FERRLECIT/NULECIT) IV  125 mg Intravenous Q T,Th,Sa-HD  . heparin subcutaneous  5,000 Units Subcutaneous Q8H  . influenza  inactive virus vaccine  0.5 mL Intramuscular Tomorrow-1000  . LORazepam  2 mg Oral TID  . methadone  40 mg Oral Daily  . multivitamin  1 tablet Oral QHS  . QUEtiapine  200 mg Oral BID  . saccharomyces boulardii  250 mg Oral BID  . thiamine  100 mg Intravenous Daily  . vancomycin  500 mg Per Tube Q6H  . DISCONTD: thiamine  100 mg Intravenous Daily   Continuous Infusions:    . dexmedetomidine 0.748 mcg/kg/hr (08/03/12 0936)  . dextrose 5 % and 0.9% NaCl 10 mL/hr at 07/29/12 0914  . feeding supplement (NEPRO CARB STEADY) 1,000 mL (07/30/12 1700)   PRN Meds:.sodium chloride, sodium chloride, sodium chloride, sodium chloride, sodium chloride, sodium chloride, sodium chloride, alteplase, feeding supplement (NEPRO CARB STEADY), fentaNYL, haloperidol lactate, heparin, heparin, heparin, heparin, heparin, heparin, heparin, heparin, lidocaine, lidocaine-prilocaine, LORazepam, ondansetron, pentafluoroprop-tetrafluoroeth  Labs:  CMP     Component Value Date/Time   NA 140 08/03/2012  0459   K 3.9 08/03/2012 0459   CL 102 08/03/2012 0459   CO2 26 08/03/2012 0459   GLUCOSE 119* 08/03/2012 0459   BUN 41* 08/03/2012 0459   CREATININE 11.04* 08/03/2012 0459   CALCIUM 8.4 08/03/2012 0459   PROT 5.2* 08/02/2012 0500   ALBUMIN 2.5* 08/03/2012 0459   AST 83* 08/02/2012 0500   ALT 39 08/02/2012 0500   ALKPHOS 77 08/02/2012 0500   BILITOT 0.3 08/02/2012 0500   GFRNONAA 6* 08/03/2012 0459   GFRAA 7* 08/03/2012 0459    Sodium  Date/Time Value Range Status  08/03/2012  4:59 AM 140  135 - 145 mEq/L Final  08/02/2012  5:00 AM 139  135 - 145 mEq/L Final  08/01/2012  5:00 AM 138  135 - 145 mEq/L Final    Potassium  Date/Time Value Range Status  08/03/2012  4:59 AM 3.9  3.5 - 5.1 mEq/L Final  08/02/2012  5:00 AM 3.8  3.5 - 5.1 mEq/L Final  08/01/2012  5:00 AM 3.4* 3.5 - 5.1 mEq/L Final    Phosphorus  Date/Time Value Range Status  08/03/2012  4:59 AM 6.8* 2.3 - 4.6 mg/dL Final  40/98/1191  4:78 AM 3.0  2.3 - 4.6 mg/dL Final  29/56/2130  8:65 AM 3.9  2.3 - 4.6 mg/dL Final    Magnesium  Date/Time Value Range Status  07/31/2012  5:00 AM 2.3  1.5 - 2.5 mg/dL Final  78/46/9629  5:28 AM 2.5  1.5 - 2.5  mg/dL Final  30/86/5784  6:96 AM 2.7* 1.5 - 2.5 mg/dL Final    CBG (last 3)   Basename 08/03/12 1153 08/03/12 0841 08/03/12 0411  GLUCAP 101* 115* 112*      Intake/Output Summary (Last 24 hours) at 08/03/12 1228 Last data filed at 08/03/12 1111  Gross per 24 hour  Intake 2046.5 ml  Output   4020 ml  Net -1973.5 ml    Weight Status:  81.2 kg up from 80.2 kg on 10/28; fluctuating with fluids  Estimated needs:  2200-2400 kcals, 96-112 gm protein, ~1.2 liters fluid daily  Nutrition Dx:  Inadequate oral intake r/t inability to eat AEB NPO status, ongoing.  Goal:  Intake to meet >90% of estimated nutrition needs, met.  Monitor:  TF tolerance/adequacy, weight trend, labs, I/O   Joaquin Courts, RD, LDN, CNSC Pager# [phone] After Hours Pager# [phone]

## 2012-08-03 NOTE — Progress Notes (Signed)
Name: RULON ABDALLA MRN: [id number] DOB: 05-12-1991    LOS: 12  PULMONARY / CRITICAL CARE MEDICINE  HPI:  21 years old male with PMH relevant for heroin addiction, depression, anxiety and panic attacks. He overdosed on heroin and alcohol 10/19 PM  And 10/20 PM he was found unresponsive at about 2:00 pm. Patient was on vent but extubated 10/27, s/p compartment syndrome and release in OR of R shoulder muscles; Currently with renal failure on HD; Now with C. Diff colitis   Events since admission: 10/20- Admitted to ICU 10/20 - Surgery with shoulder release and fasciotomy of right arm 10/21 - Started on CVVH>>d/c 10/22 10/22 - Return to OR for I&D with wound vac placement. Bicep excised  10/25 - Vac change 10/26 - diarrhea>>C diff positive 10/27 - extubated 10/27 - agitation, precedex started 10/29 - HD cath placement  Current Status: Continues to have agitation,on precedex gtt, fentanyl PRN and  Haldol PRN, which seems to work best  Vital Signs: Temp:  [97.5 F (36.4 C)-101 F (38.3 C)] 101 F (38.3 C) (11/01 0424) Pulse Rate:  [73-117] 73  (11/01 0710) Resp:  [0-20] 13  (11/01 0710) BP: (112-141)/(64-87) 118/77 mmHg (11/01 0710) SpO2:  [96 %-100 %] 100 % (11/01 0710) Weight:  [179 lb 0.2 oz (81.2 kg)] 179 lb 0.2 oz (81.2 kg) (11/01 2956)  Physical Examination: General - Asleep in bed, no distress. HEENT - NG in place Cardiac - s1s2 regular Chest - Tunneled HD catheter on left; CTAB, no wheeze Abd - deferred Ext - Rt upper arm wrapped in ACE, wound vac in place. Good pulses all extremities. Neuro - Asleep and comfortable, did not awaken due to agitation when awake  No results found.  ASSESSMENT AND PLAN  PULMONARY No results found for this basename: PHART:5,PCO2:5,PCO2ART:5,PO2ART:5,HCO3:5,O2SAT:5 in the last 168 hours Ventilator Settings: n/a  A:   1) Acute hypoxic respiratory failure likely secondary to aspiration pneumonia / pneumonitis P:   Extubated  10/27 with agitation  Adjust oxygen to keep Spo2 > 92%   CARDIOVASCULAR No results found for this basename: TROPONINI:5,LATICACIDVEN:5, O2SATVEN:5,PROBNP:5 in the last 168 hours  Lines:  Left IJ placed 07/22/12 >> R femoral HD cath 10/20>>10/30 Art line L radial 10/21>>10/24 Left tunneled IJ HD cath 10/29>>  Echo 10/22>>EF 55 to 60%  A:  1) Hypovolemic/septic shock>>off pressors 10/24. 2) Lactic acidosis likely secondary to shock and rhabdomyolysis>>resolved 3) Right upper extremity injury with compartment syndrome and possible vascular injury Note arch aortogram in OR normal.  No vascular deficit in RUE/shoulder Elevated troponin P:  Wound care per ortho and wound care team 10/31 Qtc 447   RENAL  Lab 08/03/12 0459 08/02/12 0500 08/01/12 0500 07/31/12 0500 07/30/12 0500  NA 140 139 138 142 138  K 3.9 3.8 -- -- --  CL 102 102 99 102 100  CO2 26 28 25 27 25   BUN 41* 27* 52* 42* 79*  CREATININE 11.04* 8.05* 11.90* 9.34* 13.46*  CALCIUM 8.4 8.2* 8.2* 8.4 8.7  MG -- -- -- 2.3 --  PHOS 6.8* 3.0 3.9 4.6 6.5*   Intake/Output      10/31 0701 - 11/01 0700 11/01 0701 - 11/02 0700   I.V. (mL/kg) 816.8 (10.1)    NG/GT 1360    IV Piggyback 100    Total Intake(mL/kg) 2276.8 (28)    Other     Stool 20    Total Output 20    Net +2256.8  Foley:  07/22/12  A:   1) Acute renal failure s/p CRRT>>transitioned to intermittent HD 2) Rhabdomyolysis 3) Hyperkalemia>>>resolved P:   HD per renal Perm cath placement 10/29 K+ stable at 3.9   GASTROINTESTINAL  Lab 08/03/12 0459 08/02/12 0500 08/01/12 0500 07/31/12 0500 07/30/12 0500 07/29/12 0456 07/28/12 0453  AST -- 83* -- 116* 148* 166* 253*  ALT -- 39 -- 98* 102* 116* 140*  ALKPHOS -- 77 -- 56 58 70 66  BILITOT -- 0.3 -- 0.3 0.3 0.3 0.4  PROT -- 5.2* -- 5.0* 5.0* 5.3* 4.7*  ALBUMIN 2.5* 2.4* 2.4* 2.2* 2.1*2.1* -- --    A:   1) Elevated LFT's and INR likely shock liver 2) Nutrition 3) Diarrhea 10/26 from C  diff P:   Continue tube feeds Advance diet when MS allows, per SLP recs (unable to assess due to MS)  HEMATOLOGIC  Lab 08/03/12 0459 08/02/12 0500 08/01/12 0500 07/31/12 0500 07/30/12 0500  HGB 7.3* 7.4* 8.1* 7.6* 7.4*  HCT 21.8* 21.8* 22.6* 22.1* 21.6*  PLT 381 349 378 275 217  INR -- -- -- -- --  APTT -- -- -- -- --   A:   1) Elevated INR likely from shock liver>>resolved 2) Anemia of critical illness 3) Thrombocytopenia>>improving P:  F/u CBC Epo and aranesp per renal Transfuse for Hb < 7   INFECTIOUS  Lab 08/03/12 0459 08/02/12 0500 08/01/12 0500 07/31/12 0500 07/30/12 0500  WBC 12.7* 13.1* 14.4* 12.9* 11.0*  PROCALCITON -- -- -- -- --   Cultures: Blood culture 10/20>>ng Urine culture 10/20>> negative BCx 10/23>> NGTD UCX 10/23 >>negative C diff 10/26>>Positive BCx 11/1>>  Antibiotics: Zosyn 10/20>>10/28 IV Vancomycin 10/20>>10/26 PO vancomycin 10/26>>   A:   1) Aspiration pneumonia. 2) Rt arm compartment syndrome 3) Recurrent fever>>10/23 4) Diarrhea 10/26 from C diff 5) Recurrent fever >>11/1 P:   Continue po vancomycin Recheck blood cultures x 2 D/C central line as possible infectious source -new fever  ENDOCRINE  Lab 08/03/12 0411 08/03/12 0020 08/02/12 1948 08/02/12 1626 08/02/12 1209  GLUCAP 112* 99 96 103* 93   A:   1) Hyperglycemia>>improved; no Hx of DM P:   POCT glucose TID   NEUROLOGIC  A:   1) Acute encephalopathy  - ? Anoxia vs metabolic, doubt substance withdrawal at this juncture P:   - Precedex continued for agitation, wean as tolerated - Scheduled Ativan 2mg  TID - Seroquel started 10/28>> Increase dose to 200mg  - Start Methadone 20mg  given chronic opioid use 10/29>>con't 40mg  - Con't Haldol 5 q4 hours prn -daily qTc monitoring - Neuro rec Thiamine 100 mg daily (done); also rec MRI , attempted LP - unsuccesful ,  may need fluoro guided but feel low yield here - B12, Thiamine, ammonia, TSH - WNL; obtain MRI and EEG -  Sitter at bedside, as available, for agitation and suicide precautions   GLOBAL - Can transition out of ICU once off precedex  BEST PRACTICE / DISPOSITION - Level of Care:  ICU - Primary Service:  PCCM - Consultants:  Vascular surgery, orthopedic surgery, nephrology - Code Status:  Full code - Diet:  NPO - DVT Px:  SCD, Hep SQ - GI Px:  Protonix - Skin Integrity:  Intact - Social / Family:  No family at bedside   Care during the described time interval was provided by me and/or other providers on the critical care team.  I have reviewed this patient's available data, including medical history, events of note, physical examination  and test results as part of my evaluation  CC time x  35 m  Cyril Mourning MD. [facility]. [facility] Pager [phone] If no response call [phone]

## 2012-08-03 NOTE — Progress Notes (Deleted)
History: 21 yo M with altered mental status after heroin overdose.   Background: There is a moderately sustained posterior dominant rhythm of 8.5 Hz that attenuates with eye opening. There is generalized irregular delta and theta activity that persists throughout the recording.   Photic stimulation: not performed  EEG Diagnosis: 1) Generalized slow activity  Clinical Interpretation: This abnormal EEG is recorded in the waking and drowsy state. There is evidence of a mild nonspecific generalized cerebral dysfunction(encephalopathy) There was no seizure or seizure predisposition recorded on this study.   Ritta Slot, MD [facility] [phone]  If 7pm- 7am, please page neurology on call at [phone].

## 2012-08-03 NOTE — Progress Notes (Signed)
PT Cancellation Note  Patient Details Name: Martin Chapman MRN: [id number] DOB: 08-20-1991   Cancelled Treatment:    Reason Eval/Treat Not Completed: Patient at procedure or test/unavailable. Pt currently getting EEG and RN stated that pt will be in/out of tests the rest of the day. Will attempt evaluation on Monday pending medical stability.   Milana Kidney 08/03/2012, 3:34 PM

## 2012-08-03 NOTE — Procedures (Signed)
I was present at this session.  I have reviewed the session itself and made appropriate changes.  HD via L IJ cath. tol well  Ji Fairburn L 11/1/20137:23 AM

## 2012-08-03 NOTE — Progress Notes (Signed)
Attempted SLP f/u however patient in the middle of a procedure. Will f/u 11/2.   [name] MA, CCC-SLP [phone]

## 2012-08-03 NOTE — Progress Notes (Signed)
Attempted to call dad for update -but unable to reach Have ordered fluoro guided LP by IR  Martin Chapman,Martin Chapman.

## 2012-08-04 LAB — GLUCOSE, CAPILLARY
Glucose-Capillary: 96 mg/dL (ref 70–99)
Glucose-Capillary: 108 mg/dL — ABNORMAL HIGH (ref 70–99)
Glucose-Capillary: 117 mg/dL — ABNORMAL HIGH (ref 70–99)
Glucose-Capillary: 100 mg/dL — ABNORMAL HIGH (ref 70–99)

## 2012-08-04 LAB — CBC
MCH: 29 pg (ref 26.0–34.0)
MCV: 87.4 fL (ref 78.0–100.0)
Platelets: 427 10*3/uL — ABNORMAL HIGH (ref 150–400)
RDW: 16.3 % — ABNORMAL HIGH (ref 11.5–15.5)
WBC: 13.3 10*3/uL — ABNORMAL HIGH (ref 4.0–10.5)

## 2012-08-04 LAB — RENAL FUNCTION PANEL
Albumin: 2.6 g/dL — ABNORMAL LOW (ref 3.5–5.2)
BUN: 32 mg/dL — ABNORMAL HIGH (ref 6–23)
Creatinine, Ser: 8.44 mg/dL — ABNORMAL HIGH (ref 0.50–1.35)
GFR calc non Af Amer: 8 mL/min — ABNORMAL LOW (ref >90)
Phosphorus: 5.4 mg/dL — ABNORMAL HIGH (ref 2.3–4.6)
Potassium: 4.1 mEq/L (ref 3.5–5.1)

## 2012-08-04 MED ORDER — FAMOTIDINE 20 MG PO TABS
20.0000 mg | ORAL_TABLET | Freq: Every day | ORAL | Status: DC
  Administered 2012-08-05 – 2012-08-20 (×15): 20 mg via ORAL
  Filled 2012-08-04 (×17): qty 1

## 2012-08-04 MED ORDER — VITAMIN B-1 100 MG PO TABS
100.0000 mg | ORAL_TABLET | Freq: Every day | ORAL | Status: DC
  Administered 2012-08-05 – 2012-08-06 (×2): 100 mg via ORAL
  Filled 2012-08-04 (×2): qty 1

## 2012-08-04 MED ORDER — METHADONE HCL 10 MG PO TABS
5.0000 mg | ORAL_TABLET | Freq: Four times a day (QID) | ORAL | Status: DC
  Administered 2012-08-04 – 2012-08-07 (×12): 5 mg via ORAL
  Filled 2012-08-04 (×12): qty 1

## 2012-08-04 NOTE — Progress Notes (Signed)
Name: Martin Chapman MRN: [id number] DOB: October 04, 1990    LOS: 13  PULMONARY / CRITICAL CARE MEDICINE  HPI:  21 years old male with PMH relevant for heroin addiction, depression, anxiety and panic attacks. He overdosed on heroin and alcohol 10/19 PM  And 10/20 PM he was found unresponsive at about 2:00 pm. Patient was on vent but extubated 10/27, s/p compartment syndrome and release in OR of R shoulder muscles; Currently with renal failure on HD; Now with C. Diff colitis   Events since admission: 10/20- Admitted to ICU 10/20 - Surgery with shoulder release and fasciotomy of right arm 10/21 - Started on CVVH>>d/c 10/22 10/22 - Return to OR for I&D with wound vac placement. Bicep excised  10/25 - Vac change 10/26 - diarrhea>>C diff positive 10/27 - extubated 10/27 - agitation, precedex started 10/29 - HD cath placement  Current Status: encpeh, remains on prec   Vital Signs: Temp:  [98.7 F (37.1 C)-100.8 F (38.2 C)] 98.7 F (37.1 C) (11/02 0747) Pulse Rate:  [79-130] 84  (11/02 1000) Resp:  [10-19] 14  (11/02 1000) BP: (115-146)/(62-94) 119/64 mmHg (11/02 1000) SpO2:  [93 %-100 %] 93 % (11/02 1000) Weight:  [76.2 kg (167 lb 15.9 oz)] 76.2 kg (167 lb 15.9 oz) (11/02 0300)  Physical Examination: General - Asleep in bed, no distress. HEENT - NG in place Cardiac - s1s2 regular Chest - Tunneled HD catheter on left; CTAB, no wheeze Abd - deferred Ext - Rt upper arm wrapped in ACE, wound vac in place. Good pulses all extremities. Neuro - Asleep and comfortable, did not awaken due to agitation when awake  [name] Contrast  08/04/2012  *RADIOLOGY REPORT*  Clinical Data: Polysubstance abuse.  Altered mental status.  Renal failure.  MRI HEAD WITHOUT CONTRAST  Technique:  Multiplanar, multiecho pulse sequences of the brain and surrounding structures were obtained according to standard protocol without intravenous contrast.  Comparison: CT head 04/08/2012 (normal)  Findings:  The patient had difficulty remaining motionless for the study.  Images are suboptimal.  Small or subtle lesions could be overlooked.  There is no evidence for acute infarction, intracranial hemorrhage, mass lesion, hydrocephalus, or extra-axial fluid.  There may be mild premature cerebral and cerebellar atrophy for age.  There is no visible white matter disease.  No evidence for hypoxic ischemic encephalopathy.  No basal ganglia T1 shortening to suggest early hepatic encephalopathy.  Normal pituitary and cerebellar tonsils. Major intracranial flow voids are preserved.  Chronic sinus disease right division sphenoid sinus.  Slight dependent mastoid fluid bilaterally.  Because of renal failure, Gadolinium contrast could not be administered.  Meningeal enhancement or early changes of meningoencephalitis could be inapparent on this noncontrast examination.  IMPRESSION: Except for mild premature cerebral and cerebellar atrophy, there is no visible acute intracranial abnormality.  No signs of hypoxic ischemic encephalopathy or acute/subacute infarction.  No foci of chronic hemorrhage.  No evidence for hepatic encephalopathy metabolic changes.   Original Report Authenticated By: Davonna Belling, M.D.     ASSESSMENT AND PLAN  PULMONARY No results found for this basename: PHART:5,PCO2:5,PCO2ART:5,PO2ART:5,HCO3:5,O2SAT:5 in the last 168 hours Ventilator Settings: n/a  A:   1) Acute hypoxic respiratory failure likely secondary to aspiration pneumonia / pneumonitis.  Extubated 10/27 with agitation  P:   Adjust oxygen to keep Spo2 > 92% Aspiration precautions  CARDIOVASCULAR No results found for this basename: TROPONINI:5,LATICACIDVEN:5, O2SATVEN:5,PROBNP:5 in the last 168 hours  Lines:  Left IJ placed 07/22/12 >> R femoral HD  cath 10/20>>10/30 Art line L radial 10/21>>10/24 Left tunneled IJ HD cath 10/29>>  Echo 10/22>>EF 55 to 60%  A:  1) Hypovolemic/septic shock>>off pressors 10/24. 2) Lactic acidosis  likely secondary to shock and rhabdomyolysis>>resolved 3) Right upper extremity injury with compartment syndrome and possible vascular injury Note arch aortogram in OR normal.  No vascular deficit in RUE/shoulder 4) Elevated troponin P:  Wound care per ortho and wound care team Cont routine monitoring tele on precedex  RENAL  Lab 08/04/12 0448 08/03/12 0459 08/02/12 0500 08/01/12 0500 07/31/12 0500  NA 139 140 139 138 142  K 4.1 3.9 -- -- --  CL 100 102 102 99 102  CO2 23 26 28 25 27   BUN 32* 41* 27* 52* 42*  CREATININE 8.44* 11.04* 8.05* 11.90* 9.34*  CALCIUM 8.5 8.4 8.2* 8.2* 8.4  MG -- -- -- -- 2.3  PHOS 5.4* 6.8* 3.0 3.9 4.6   Intake/Output      11/01 0701 - 11/02 0700 11/02 0701 - 11/03 0700   I.V. (mL/kg) 945.7 (12.4) 112.3 (1.5)   Other  20   NG/GT 1265 225   IV Piggyback 50    Total Intake(mL/kg) 2260.7 (29.7) 357.3 (4.7)   Drains 150    Other 4000    Stool 50    Total Output 4200    Net -1939.3 +357.3         Foley:  07/22/12  A:   1) Acute renal failure s/p CRRT>>transitioned to intermittent HD 2) Rhabdomyolysis (resolved) P:   HD per renal Perm cath placement 10/29  GASTROINTESTINAL  Lab 08/04/12 0448 08/03/12 0459 08/02/12 0500 08/01/12 0500 07/31/12 0500 07/30/12 0500 07/29/12 0456  AST -- -- 83* -- 116* 148* 166*  ALT -- -- 39 -- 98* 102* 116*  ALKPHOS -- -- 77 -- 56 58 70  BILITOT -- -- 0.3 -- 0.3 0.3 0.3  PROT -- -- 5.2* -- 5.0* 5.0* 5.3*  ALBUMIN 2.6* 2.5* 2.4* 2.4* 2.2* -- --    A:   1) Elevated LFT's and INR likely shock liver (improving) 2) Nutrition 3) Diarrhea 10/26 from C diff P:   Continue tube feeds Advance diet when MS allows, per SLP recs (unable to assess due to MS)  HEMATOLOGIC  Lab 08/04/12 0448 08/03/12 0459 08/02/12 0500 08/01/12 0500 07/31/12 0500  HGB 7.6* 7.3* 7.4* 8.1* 7.6*  HCT 22.9* 21.8* 21.8* 22.6* 22.1*  PLT 427* 381 349 378 275  INR -- -- -- -- --  APTT -- -- -- -- --   A:   1) Elevated INR likely from  shock liver>>resolved 2) Anemia of critical illness 3) Thrombocytopenia>>improving P:  F/u CBC Epo and aranesp per renal Transfuse for Hb < 7  INFECTIOUS  Lab 08/04/12 0448 08/03/12 0459 08/02/12 0500 08/01/12 0500 07/31/12 0500  WBC 13.3* 12.7* 13.1* 14.4* 12.9*  PROCALCITON -- -- -- -- --   Cultures: Blood culture 10/20>>ng Urine culture 10/20>> negative BCx 10/23>> NGTD UCX 10/23 >>negative C diff 10/26>>Positive BCx 11/1>>  Antibiotics: Zosyn 10/20>>10/28 IV Vancomycin 10/20>>10/26 PO vancomycin 10/26>>  A:   1) Aspiration pneumonia. 2) Rt arm compartment syndrome 3) Recurrent fever>>10/23 4) Diarrhea 10/26 from C diff 5) Recurrent fever >>11/1 P:   Continue po vancomycin, establish stop date  Avoid empiric abx Recheck blood cultures x 2 D/C'd central line as possible infectious source -new fever  ENDOCRINE  Lab 08/04/12 0731 08/04/12 0439 08/04/12 0010 08/03/12 1652 08/03/12 1153  GLUCAP 96 99 107* 99 101*  A:   1) Hyperglycemia>>improved; no Hx of DM P:   POCT glucose TID  NEUROLOGIC EEG: mild encephalopathy MRI brain 11/1: negative - B12, Thiamine, ammonia, TSH - WNL A:   1) Acute encephalopathy  - favor metabolic, doubt substance withdrawal at this juncture. MR brain reassuring P:   - Precedex continued for agitation, wean as tolerated - Scheduled Ativan 2mg  TID - Seroquel 200mg  - Staredt Methadone 20mg  given chronic opioid use 10/29>>concenr QTC and fact was binge of heroic, change to 5 q6h, may have valleys and peeks - Con't Haldol 5 q4 hours prn -daily qTc monitoring - Neuro rec Thiamine 100 mg daily (done); also rec MRI , attempted LP - unsuccesful ,  may need fluoro guided but feel low yield here -awaiting IR eval for LP - Sitter at bedside, as available, for agitation and suicide precautions Daily ECG for qtc  GLOBAL -s/p OD  on heroin and alcohol 10/19 PM  And 10/20 PM he was found unresponsive at about 2:00 pm. Patient was on vent  but extubated 10/27, s/p compartment syndrome and release in OR of R shoulder muscles; Currently with renal failure on HD; Now with C. Diff colitis. Active issues include persistent encephalopathy, dialysis dependent renal failure, C diff infection. He is hemodynamically stable. LP pending IR. Will send: cell count with diff, protein, glucose, hsv by pcr, but Agree that this is not likely infection   BEST PRACTICE / DISPOSITION - Level of Care:  ICU - Primary Service:  PCCM - Consultants:  Vascular surgery, orthopedic surgery, nephrology - Code Status:  Full code - Diet:  NPO - DVT Px:  SCD, Hep SQ - GI Px:  Protonix - Skin Integrity:  Intact - Social / Family:  No family at bedside   Ccm time 30 min   Care during the described time interval was provided by me and/or other providers on the critical care team.  I have reviewed this patient's available data, including medical history, events of note, physical examination and test results as part of my evaluation  Mcarthur Rossetti. Tyson Alias, MD, FACP Pgr: [phone] [facility]    BABCOCK,PETE

## 2012-08-04 NOTE — Progress Notes (Signed)
Subjective: Interval History: none.  Objective: Vital signs in last 24 hours: Temp:  [98.2 F (36.8 C)-100.8 F (38.2 C)] 100.2 F (37.9 C) (11/02 0458) Pulse Rate:  [41-130] 81  (11/02 0600) Resp:  [10-19] 16  (11/02 0500) BP: (115-146)/(62-94) 121/69 mmHg (11/02 0600) SpO2:  [69 %-100 %] 95 % (11/02 0600) Weight:  [76.2 kg (167 lb 15.9 oz)] 76.2 kg (167 lb 15.9 oz) (11/02 0300) Weight change: -5 kg (-11 lb 0.4 oz)  Intake/Output from previous day: 11/01 0701 - 11/02 0700 In: 2161.6 [I.V.:901.6; NG/GT:1210; IV Piggyback:50] Out: 4200 [Drains:150; Stool:50] Intake/Output this shift:    General appearance: answers "Good Morning", but sedated Resp: diminished breath sounds bilaterally and rhonchi bilaterally Chest wall: no tenderness, LIJ cath Cardio: systolic murmur: holosystolic 2/6, blowing at apex GI: pos bs,liver down 5 cm, soft Extremities: edema 1+  Lab Results:  Basename 08/04/12 0448 08/03/12 0459  WBC 13.3* 12.7*  HGB 7.6* 7.3*  HCT 22.9* 21.8*  PLT 427* 381   BMET:  Basename 08/04/12 0448 08/03/12 0459  NA 139 140  K 4.1 3.9  CL 100 102  CO2 23 26  GLUCOSE 109* 119*  BUN 32* 41*  CREATININE 8.44* 11.04*  CALCIUM 8.5 8.4   No results found for this basename: PTH:2 in the last 72 hours Iron Studies: No results found for this basename: IRON,TIBC,TRANSFERRIN,FERRITIN in the last 72 hours  Studies/Results: No results found.  I have reviewed the patient's current medications.  Assessment/Plan: 1 AKI no recovery.  Will follow chem in am as is catabolic and may need extra HD 2 Nutrition variable 3 Enceph requiring multiple meds to control, ? If will get a lot better 4 Anemia on epo, fe 5  Drug OD/Substance abuse 6 Fever follow P Follow chem, extra HD if needed, TF    LOS: 13 days   Daylene Vandenbosch L 08/04/2012,7:43 AM

## 2012-08-04 NOTE — Progress Notes (Signed)
Sedated, but easily rouses and interacts.   Exam: Filed Vitals:   08/04/12 1000  BP: 119/64  Pulse: 84  Temp:   Resp: 14   MS: Awakens easily, oriented to person, gives month as October, year as 2014. Does not know location.  Motor: Appears to move all extremities well Sensory: responds to noxious stimuli x 4.    MRI - I see no acute lesions, formal read pending.  EEG - mild encephalopathy  Impression: 21 yo M with persistent delirium in the setting of recent drug overdose, rhabdomyolisis, compartement syndrome s/p surgery. His delirium may be secondary to his general medical condition and may improve slowly over time, however without clear other cause, I feel that it would be reasonable to perform an LP to assess for infectious causes.   1) LP, would send fluid for cell count with diff, protein, glucose, hsv by pcr. I have a very low suspicion for an infectious cause of AMS, but without another clear cause of delirium, I feel it may be worthwhile to pursue workup.  2) minimize sedating medications to the point possible without causing danger to the patient or health care providers.

## 2012-08-04 NOTE — Progress Notes (Signed)
SLP Cancellation Note  Patient Details Name: Martin Chapman MRN: [id number] DOB: 1991-05-29   Cancelled treatment:     Attempt x1 for dysphagia treatment but not completed due to decreased LOA limiting ability to participate.     Moreen Fowler MS, CCC-SLP [phone] [facility] 08/04/2012, 9:10 AM

## 2012-08-05 DIAGNOSIS — D649 Anemia, unspecified: Secondary | ICD-10-CM

## 2012-08-05 LAB — CSF CELL COUNT WITH DIFFERENTIAL
RBC Count, CSF: 710 /mm3 — ABNORMAL HIGH
Tube #: 1

## 2012-08-05 LAB — GLUCOSE, CAPILLARY
Glucose-Capillary: 108 mg/dL — ABNORMAL HIGH (ref 70–99)
Glucose-Capillary: 91 mg/dL (ref 70–99)
Glucose-Capillary: 92 mg/dL (ref 70–99)

## 2012-08-05 LAB — PROTEIN AND GLUCOSE, CSF: Total  Protein, CSF: 39 mg/dL (ref 15–45)

## 2012-08-05 MED ORDER — ALTEPLASE 2 MG IJ SOLR
2.0000 mg | Freq: Once | INTRAMUSCULAR | Status: AC | PRN
  Filled 2012-08-05: qty 2

## 2012-08-05 MED ORDER — SODIUM CHLORIDE 0.9 % IV SOLN
INTRAVENOUS | Status: DC
  Administered 2012-08-05: 07:00:00 via INTRAVENOUS

## 2012-08-05 MED ORDER — SODIUM CHLORIDE 0.9 % IV SOLN: 100.0000 mL | INTRAVENOUS | Status: DC | PRN

## 2012-08-05 MED ORDER — NEPRO/CARBSTEADY PO LIQD
237.0000 mL | ORAL | Status: DC | PRN
  Filled 2012-08-05: qty 237

## 2012-08-05 MED ORDER — LIDOCAINE HCL (PF) 1 % IJ SOLN: 5.0000 mL | INTRAMUSCULAR | Status: DC | PRN

## 2012-08-05 MED ORDER — FENTANYL CITRATE 0.05 MG/ML IJ SOLN
INTRAMUSCULAR | Status: AC
  Filled 2012-08-05: qty 4

## 2012-08-05 MED ORDER — MIDAZOLAM HCL 2 MG/2ML IJ SOLN
INTRAMUSCULAR | Status: AC
  Administered 2012-08-05: 1 mg
  Filled 2012-08-05: qty 2

## 2012-08-05 MED ORDER — HEPARIN SODIUM (PORCINE) 1000 UNIT/ML DIALYSIS
1000.0000 [IU] | INTRAMUSCULAR | Status: DC | PRN
  Filled 2012-08-05: qty 1

## 2012-08-05 MED ORDER — LIDOCAINE-PRILOCAINE 2.5-2.5 % EX CREA
1.0000 "application " | TOPICAL_CREAM | CUTANEOUS | Status: DC | PRN
  Filled 2012-08-05: qty 5

## 2012-08-05 MED ORDER — FENTANYL CITRATE 0.05 MG/ML IJ SOLN
INTRAMUSCULAR | Status: AC
  Administered 2012-08-05: 25 ug
  Filled 2012-08-05: qty 2

## 2012-08-05 MED ORDER — HEPARIN SODIUM (PORCINE) 1000 UNIT/ML DIALYSIS
100.0000 [IU]/kg | INTRAMUSCULAR | Status: DC | PRN
  Filled 2012-08-05: qty 8

## 2012-08-05 MED ORDER — MIDAZOLAM HCL 2 MG/2ML IJ SOLN
INTRAMUSCULAR | Status: AC
  Filled 2012-08-05: qty 4

## 2012-08-05 MED ORDER — PENTAFLUOROPROP-TETRAFLUOROETH EX AERO: 1.0000 "application " | INHALATION_SPRAY | CUTANEOUS | Status: DC | PRN

## 2012-08-05 NOTE — Procedures (Signed)
Lumbar Puncture Procedure Note  Pre-operative Diagnosis: r/o hsv enceph  Post-operative Diagnosis:r/o hsv enceph  Indications: Diagnostic  Procedure Details   Consent: Informed consent was obtained. Risks of the procedure were discussed including: infection, bleeding, pain and headache.  The patient was positioned under sterile conditions. Betadine solution and sterile drapes were utilized. A spinal needle was inserted at the L2 - L3 interspace.  Spinal fluid was obtained and sent to the laboratory.  Findings mL of clear spinal fluid was obtained. Opening Pressure: unable, lowcm H2O pressure. Closing Pressure: lowcm H2O pressure.  Complications:  None; patient tolerated the procedure well.        Condition: stable  Plan Bed rest for 5 hours.  Agitated at end, only got 2 cc Had to terminate procedure  Will send jsv, cell count diff etc  Mcarthur Rossetti. Tyson Alias, MD, FACP Pgr: [phone] [facility]

## 2012-08-05 NOTE — Progress Notes (Signed)
Speech Language Pathology Dysphagia Treatment Patient Details Name: Martin Chapman MRN: [id number] DOB: 1991-09-19 Today's Date: 08/05/2012 Time: 7829-5621 SLP Time Calculation (min): 37 min  Assessment / Plan / Recommendation Clinical Impression  Purpose of diagnostic treatment to follow for diet tolerance of regular consistency and thin liquids.  Patient continues receiving all nutrition/hydration via NG and diet not initiated due to continued lethargy and intermittent LOA.  RN paged treating SLP reporting patient more alert and sitting in recliner requesting coffee.  Patient observed with regular consistency and thin water by spoon and cup. Trials of solids limited as patient refused further trials as he said he would get sick if he had anymore.  Moderate verbal and tactile cues required for patient to maintain LOA during oral prep stage with cracker.  No outward s/s of aspiration noted with thin liquid trials.  Recommend to continue regular consistency diet/thin liquids as tolerated only when patient is fully alert.  Recommend to continue temporary means of nutrition due to continued fluctuating mentation .  ST to follow for diet tolerance.      Diet Recommendation  Continue with Current Diet: Regular;Thin liquid    SLP Plan Continue with current plan of care      Swallowing Goals  SLP Swallowing Goals Swallow Study Goal #1 - Progress: Progressing toward goal Swallow Study Goal #2 - Progress: Progressing toward goal  General Temperature Spikes Noted: No Respiratory Status: Room air Behavior/Cognition: Confused;Agitated;Impulsive;Distractible;Requires cueing;Decreased sustained attention;Lethargic Oral Cavity - Dentition: Adequate natural dentition Patient Positioning: Upright in chair  Oral Cavity - Oral Hygiene Does patient have any of the following "at risk" factors?: None of the above Patient is HIGH RISK - Oral Care Protocol followed (see row info): Yes Patient is AT RISK  - Oral Care Protocol followed (see row info): Yes   Dysphagia Treatment Treatment focused on: Patient/family/caregiver education;Facilitation of pharyngeal phase;Facilitation of oral preparatory phase;Facilitation of oral phase Family/Caregiver Educated: Father Treatment Methods/Modalities: Skilled observation;Differential diagnosis Patient observed directly with PO's: Yes Type of PO's observed: Regular;Thin liquids Feeding: Total assist Liquids provided via: Teaspoon;Cup Oral Phase Signs & Symptoms: Prolonged oral phase Type of cueing: Verbal;Tactile Amount of cueing: Moderate   GO    Moreen Fowler MS, CCC-SLP [phone] [facility] 08/05/2012, 1:57 PM

## 2012-08-05 NOTE — Progress Notes (Signed)
Pt awake and agitated and has wrists restrains in place. Pt trying to hit and Cytogeneticist. Will continue bilat wrist restraints

## 2012-08-05 NOTE — Progress Notes (Addendum)
Pt defecating all in bed and floor and chair. New flexiseal placed. Pt biligerent and twriling self all in stool and pulling out lines and also pulled out NG tube. Called for new orders, see mar and orders. Pt danger to self and healthcare providers. Agitated and angry at times.

## 2012-08-05 NOTE — Progress Notes (Signed)
Name: SHAMON COTHRAN MRN: [id number] DOB: [birth year]    LOS: 14  PULMONARY / CRITICAL CARE MEDICINE  HPI:  21 years old male with PMH relevant for heroin addiction, depression, anxiety and panic attacks. He overdosed on heroin and alcohol 10/19 PM  And 10/20 PM he was found unresponsive at about 2:00 pm. Patient was on vent but extubated 10/27, s/p compartment syndrome and release in OR of R shoulder muscles; Currently with renal failure on HD; Now with C. Diff colitis   Events since admission: 10/20- Admitted to ICU 10/20 - Surgery with shoulder release and fasciotomy of right arm 10/21 - Started on CVVH>>d/c 10/22 10/22 - Return to OR for I&D with wound vac placement. Bicep excised  10/25 - Vac change 10/26 - diarrhea>>C diff positive 10/27 - extubated 10/27 - agitation, precedex started 10/29 - HD cath placement  Current Status: encpeh, remains on precedex, major agitation today noted   Vital Signs: Temp:  [98.2 F (36.8 C)-100.8 F (38.2 C)] 98.2 F (36.8 C) (11/03 1554) Pulse Rate:  [31-117] 98  (11/03 1200) Resp:  [10-18] 12  (11/03 1200) BP: (117-151)/(59-94) 126/69 mmHg (11/03 1200) SpO2:  [89 %-95 %] 95 % (11/03 1200) Weight:  [78.1 kg (172 lb 2.9 oz)] 78.1 kg (172 lb 2.9 oz) (11/03 0500)  Physical Examination: General - Asleep in bed, no distress, just resedated HEENT - NG in place Cardiac - s1s2 regular Chest - Tunneled HD catheter on left; CTAB Abd - soft, bs wnl , no r Ext - Rt upper arm wrapped in ACE, wound vac in place. Good pulses all extremities. Neuro - Just hit multiple RN, now sedated rass -1  [name] Contrast  08/04/2012  *RADIOLOGY REPORT*  Clinical Data: Polysubstance abuse.  Altered mental status.  Renal failure.  MRI HEAD WITHOUT CONTRAST  Technique:  Multiplanar, multiecho pulse sequences of the brain and surrounding structures were obtained according to standard protocol without intravenous contrast.  Comparison: CT head 04/08/2012  (normal)  Findings: The patient had difficulty remaining motionless for the study.  Images are suboptimal.  Small or subtle lesions could be overlooked.  There is no evidence for acute infarction, intracranial hemorrhage, mass lesion, hydrocephalus, or extra-axial fluid.  There may be mild premature cerebral and cerebellar atrophy for age.  There is no visible white matter disease.  No evidence for hypoxic ischemic encephalopathy.  No basal ganglia T1 shortening to suggest early hepatic encephalopathy.  Normal pituitary and cerebellar tonsils. Major intracranial flow voids are preserved.  Chronic sinus disease right division sphenoid sinus.  Slight dependent mastoid fluid bilaterally.  Because of renal failure, Gadolinium contrast could not be administered.  Meningeal enhancement or early changes of meningoencephalitis could be inapparent on this noncontrast examination.  IMPRESSION: Except for mild premature cerebral and cerebellar atrophy, there is no visible acute intracranial abnormality.  No signs of hypoxic ischemic encephalopathy or acute/subacute infarction.  No foci of chronic hemorrhage.  No evidence for hepatic encephalopathy metabolic changes.   Original Report Authenticated By: Davonna Belling, M.D.     ASSESSMENT AND PLAN  PULMONARY No results found for this basename: PHART:5,PCO2:5,PCO2ART:5,PO2ART:5,HCO3:5,O2SAT:5 in the last 168 hours Ventilator Settings: n/a  A:   1) Acute hypoxic respiratory failure likely secondary to aspiration pneumonia / pneumonitis.  Extubated 10/27 with agitation  P:   Adjust oxygen to keep Spo2 > 92% Aspiration precautions Just pulled  NGT, sit upright when able  CARDIOVASCULAR No results found for this basename: TROPONINI:5,LATICACIDVEN:5, O2SATVEN:5,PROBNP:5 in the  last 168 hours  Lines:  Left IJ placed 07/22/12 >> R femoral HD cath 10/20>>10/30 Art line L radial 10/21>>10/24 Left tunneled IJ HD cath 10/29>>  Echo 10/22>>EF 55 to 60%  A:  1)  Hypovolemic/septic shock>>off pressors 10/24. 2) Lactic acidosis likely secondary to shock and rhabdomyolysis>>resolved 3) Right upper extremity injury with compartment syndrome and possible vascular injury Note arch aortogram in OR normal.  No vascular deficit in RUE/shoulder 4) Elevated troponin P:  Wound care per ortho and wound care team Cont routine monitoring tele on precedex Have increased precedex, BP and HR remain tolerable  RENAL  Lab 08/04/12 0448 08/03/12 0459 08/02/12 0500 08/01/12 0500 07/31/12 0500  NA 139 140 139 138 142  K 4.1 3.9 -- -- --  CL 100 102 102 99 102  CO2 23 26 28 25 27   BUN 32* 41* 27* 52* 42*  CREATININE 8.44* 11.04* 8.05* 11.90* 9.34*  CALCIUM 8.5 8.4 8.2* 8.2* 8.4  MG -- -- -- -- 2.3  PHOS 5.4* 6.8* 3.0 3.9 4.6   Intake/Output      11/02 0701 - 11/03 0700 11/03 0701 - 11/04 0700   P.O.  360   I.V. (mL/kg) 738.4 (9.5) 207.6 (2.7)   Other 80 105   NG/GT 830 330   IV Piggyback 50    Total Intake(mL/kg) 1698.4 (21.7) 1002.6 (12.8)   Urine (mL/kg/hr) 400 (0.2)    Drains 100    Other     Stool     Total Output 500    Net +1198.4 +1002.6         Foley:  07/22/12  A:   1) Acute renal failure s/p CRRT>>transitioned to intermittent HD 2) Rhabdomyolysis (resolved) P:   HD per renal in am  Perm cath placement 10/29 Chem in am   GASTROINTESTINAL  Lab 08/04/12 0448 08/03/12 0459 08/02/12 0500 08/01/12 0500 07/31/12 0500 07/30/12 0500  AST -- -- 83* -- 116* 148*  ALT -- -- 39 -- 98* 102*  ALKPHOS -- -- 77 -- 56 58  BILITOT -- -- 0.3 -- 0.3 0.3  PROT -- -- 5.2* -- 5.0* 5.0*  ALBUMIN 2.6* 2.5* 2.4* 2.4* 2.2* --    A:   1) Elevated LFT's and INR likely shock liver (improving) 2) Nutrition 3) Diarrhea 10/26 from C diff P:   Just self dc NGT Re assess him on higher precedex and consider slp then  HEMATOLOGIC  Lab 08/04/12 1315 08/04/12 0448 08/03/12 0459 08/02/12 0500 08/01/12 0500 07/31/12 0500  HGB -- 7.6* 7.3* 7.4* 8.1* 7.6*  HCT  -- 22.9* 21.8* 21.8* 22.6* 22.1*  PLT -- 427* 381 349 378 275  INR 1.24 -- -- -- -- --  APTT -- -- -- -- -- --   A:   1) Elevated INR likely from shock liver>>resolved 2) Anemia of critical illness 3) Thrombocytopenia>>improving P:  F/u CBC needed in am , see trend hct Epo and aranesp per renal Transfuse for Hb < 7  INFECTIOUS  Lab 08/04/12 0448 08/03/12 0459 08/02/12 0500 08/01/12 0500 07/31/12 0500  WBC 13.3* 12.7* 13.1* 14.4* 12.9*  PROCALCITON -- -- -- -- --   Cultures: Blood culture 10/20>>ng Urine culture 10/20>> negative BCx 10/23>> NGTD UCX 10/23 >>negative C diff 10/26>>Positive BCx 11/1>>  Antibiotics: Zosyn 10/20>>10/28 IV Vancomycin 10/20>>10/26 PO vancomycin 10/26>>  A:   1) Aspiration pneumonia. 2) Rt arm compartment syndrome 3) Recurrent fever>>10/23 4) Diarrhea 10/26 from C diff 5) Recurrent fever >>11/1 P:   Continue  po vancomycin, establish stop date for 14 days Avoid empiric abx See HSV on neuro for LP  ENDOCRINE  Lab 08/05/12 1536 08/05/12 1137 08/05/12 0435 08/04/12 2351 08/04/12 2019  GLUCAP 92 91 108* 108* 100*   A:   1) Hyperglycemia>>improved; no Hx of DM P:   POCT glucose TID  NEUROLOGIC EEG: mild encephalopathy MRI brain 11/1: negative - B12, Thiamine, ammonia, TSH - WNL A:   1) Acute encephalopathy  - favor metabolic, doubt substance withdrawal at this juncture. MR brain reassuring P:   - Precedex continued for agitation, increase to 1.7 max, severe agitation - Scheduled Ativan 2mg  TID, maintain - Seroquel 200mg  - Methadone  5 q6h, may have valleys and peeks, avoid daily dose in icu - Con't Haldol 5 q4 hours prn -daily qTc monitoring, today 442 - Neuro rec Thiamine 100 mg daily (done); also rec MRI , attempted LP - unsuccesful ,  may need fluoro guided but feel low yield here -With h/o bneuro hsv 1 lip? , will re consider lp today - Sitter at bedside, as available, for agitation and suicide precautions Daily ECG for  qtc, reviewed  GLOBAL -s/p OD  on heroin and alcohol 10/19 PM  And 10/20 PM he was found unresponsive at about 2:00 pm. Patient was on vent but extubated 10/27, s/p compartment syndrome and release in OR of R shoulder muscles; Currently with renal failure on HD; Now with C. Diff colitis. Active issues include persistent encephalopathy, dialysis dependent renal failure, C diff infection. He is hemodynamically stable. LP reconsidered Will send: cell count with diff, protein, glucose, hsv by pcr, but Agree that this is not likely infection   BEST PRACTICE / DISPOSITION - Level of Care:  ICU - Primary Service:  PCCM - Consultants:  Vascular surgery, orthopedic surgery, nephrology - Code Status:  Full code - Diet:  NPO - DVT Px:  SCD, Hep SQ - GI Px:  Protonix - Skin Integrity:  Intact - Social / Family:  No family at bedside   Ccm time 30 min severe agitation management  Care during the described time interval was provided by me and/or other providers on the critical care team.  I have reviewed this patient's available data, including medical history, events of note, physical examination and test results as part of my evaluation  Mcarthur Rossetti. Tyson Alias, MD, FACP Pgr: [phone] [facility]    Nelda Bucks.

## 2012-08-05 NOTE — Progress Notes (Signed)
Pt combative, impulsive, and uncooperative. Pt displays a complete disassociation with circumstances, orientation, and personal safety.  Safety of staff is also a concern at this time.  2mg  of IV ativan given. Will continue to monitor and protect the safety of the patient and staff.

## 2012-08-05 NOTE — Progress Notes (Signed)
Subjective: Interval History: none.  Objective: Vital signs in last 24 hours: Temp:  [99 F (37.2 C)-100.8 F (38.2 C)] 100.8 F (38.2 C) (11/03 0500) Pulse Rate:  [31-124] 89  (11/03 0700) Resp:  [10-22] 17  (11/03 0700) BP: (115-151)/(59-94) 126/68 mmHg (11/03 0700) SpO2:  [89 %-95 %] 93 % (11/03 0700) Weight:  [78.1 kg (172 lb 2.9 oz)] 78.1 kg (172 lb 2.9 oz) (11/03 0500) Weight change: 1.9 kg (4 lb 3 oz)  Intake/Output from previous day: 11/02 0701 - 11/03 0700 In: 1643.4 [I.V.:738.4; NG/GT:775; IV Piggyback:50] Out: 500 [Urine:400; Drains:100] Intake/Output this shift:    General appearance: mumbles, does obey some commands Resp: diminished breath sounds bilaterally and rhonchi bilaterally Cardio: S1, S2 normal and systolic murmur: holosystolic 2/6, blowing at apex GI: abnormal findings:  pos bs, liver down 4 cm soft Extremities: edema 1+  Lab Results:  Basename 08/04/12 0448 08/03/12 0459  WBC 13.3* 12.7*  HGB 7.6* 7.3*  HCT 22.9* 21.8*  PLT 427* 381   BMET:  Basename 08/04/12 0448 08/03/12 0459  NA 139 140  K 4.1 3.9  CL 100 102  CO2 23 26  GLUCOSE 109* 119*  BUN 32* 41*  CREATININE 8.44* 11.04*  CALCIUM 8.5 8.4   No results found for this basename: PTH:2 in the last 72 hours Iron Studies: No results found for this basename: IRON,TIBC,TRANSFERRIN,FERRITIN in the last 72 hours  Studies/Results: [name] Contrast  08/04/2012  *RADIOLOGY REPORT*  Clinical Data: Polysubstance abuse.  Altered mental status.  Renal failure.  MRI HEAD WITHOUT CONTRAST  Technique:  Multiplanar, multiecho pulse sequences of the brain and surrounding structures were obtained according to standard protocol without intravenous contrast.  Comparison: CT head 04/08/2012 (normal)  Findings: The patient had difficulty remaining motionless for the study.  Images are suboptimal.  Small or subtle lesions could be overlooked.  There is no evidence for acute infarction, intracranial  hemorrhage, mass lesion, hydrocephalus, or extra-axial fluid.  There may be mild premature cerebral and cerebellar atrophy for age.  There is no visible white matter disease.  No evidence for hypoxic ischemic encephalopathy.  No basal ganglia T1 shortening to suggest early hepatic encephalopathy.  Normal pituitary and cerebellar tonsils. Major intracranial flow voids are preserved.  Chronic sinus disease right division sphenoid sinus.  Slight dependent mastoid fluid bilaterally.  Because of renal failure, Gadolinium contrast could not be administered.  Meningeal enhancement or early changes of meningoencephalitis could be inapparent on this noncontrast examination.  IMPRESSION: Except for mild premature cerebral and cerebellar atrophy, there is no visible acute intracranial abnormality.  No signs of hypoxic ischemic encephalopathy or acute/subacute infarction.  No foci of chronic hemorrhage.  No evidence for hepatic encephalopathy metabolic changes.   Original Report Authenticated By: Davonna Belling, M.D.     I have reviewed the patient's current medications.  Assessment/Plan: 1 AKI oliguric ATN.  Check labs in am. 2 Enceph less agitated 3 Anemia 4 OD/substance abuse 5 Nutrition P HD ,check labs, sedate    LOS: 14 days   Joana Nolton L 08/05/2012,8:39 AM

## 2012-08-05 NOTE — Progress Notes (Addendum)
Discussed with Dr De Burrs ST recommendation for regular renal diet with thin liquids and the need to continue tube feed. Order received to not restart TF and to initiate regular renal diet. NGT left in for the time being for medication administration and possible resumption of TF if patient intolerant of regular renal diet. Will continue to assess and monitor patients intake.

## 2012-08-05 NOTE — Progress Notes (Signed)
Continues to be agitated at night. Seems to have his nights and days mixed up.   Exam: Filed Vitals:   08/05/12 1554  BP:   Pulse:   Temp: 98.2 F (36.8 C)  Resp:    MS: Awakens easily, oriented to person, gives correct location.  Motor: Appears to move all extremities well Sensory: responds to noxious stimuli x 4.   Impression: 21 yo M with persistent ICU delirium in the setting of recent drug overdose, rhabdomyolisis, compartement syndrome s/p surgery. His delirium may be secondary to his general medical condition and may improve slowly over time, however without clear other cause, I feel that it would be reasonable to perform an LP to assess for infectious causes.   1) minimize sedating medications to the point possible without causing danger to the patient or health care providers.  2) Consider LP if not improving.  3) Lights on during day, off at night.   Ritta Slot, MD [facility] [phone]  If 7pm- 7am, please page neurology on call at [phone].

## 2012-08-06 ENCOUNTER — Inpatient Hospital Stay (HOSPITAL_COMMUNITY): Payer: BC Managed Care – PPO

## 2012-08-06 LAB — HERPES SIMPLEX VIRUS(HSV) DNA BY PCR
HSV 1 DNA: NOT DETECTED
HSV 2 DNA: NOT DETECTED

## 2012-08-06 LAB — COMPREHENSIVE METABOLIC PANEL
ALT: 12 U/L (ref 0–53)
AST: 32 U/L (ref 0–37)
Albumin: 2.8 g/dL — ABNORMAL LOW (ref 3.5–5.2)
CO2: 20 mEq/L (ref 19–32)
Chloride: 100 mEq/L (ref 96–112)
GFR calc non Af Amer: 4 mL/min — ABNORMAL LOW (ref >90)
Sodium: 138 mEq/L (ref 135–145)
Total Bilirubin: 0.4 mg/dL (ref 0.3–1.2)

## 2012-08-06 LAB — CBC WITH DIFFERENTIAL/PLATELET
Basophils Absolute: 0 10*3/uL (ref 0.0–0.1)
Basophils Relative: 0 % (ref 0–1)
Lymphocytes Relative: 14 % (ref 12–46)
MCHC: 33.5 g/dL (ref 30.0–36.0)
Neutro Abs: 9.6 10*3/uL — ABNORMAL HIGH (ref 1.7–7.7)
Neutrophils Relative %: 73 % (ref 43–77)
Platelets: 467 10*3/uL — ABNORMAL HIGH (ref 150–400)
RDW: 14.9 % (ref 11.5–15.5)
WBC: 13.1 10*3/uL — ABNORMAL HIGH (ref 4.0–10.5)

## 2012-08-06 MED ORDER — SODIUM CHLORIDE 0.9 % IV SOLN
125.0000 mg | INTRAVENOUS | Status: DC
  Administered 2012-08-06 – 2012-08-13 (×4): 125 mg via INTRAVENOUS
  Filled 2012-08-06 (×11): qty 10

## 2012-08-06 MED ORDER — HALOPERIDOL LACTATE 5 MG/ML IJ SOLN
5.0000 mg | Freq: Once | INTRAMUSCULAR | Status: AC
  Administered 2012-08-06: 5 mg via INTRAVENOUS
  Filled 2012-08-06: qty 1

## 2012-08-06 MED ORDER — THIAMINE HCL 100 MG/ML IJ SOLN
100.0000 mg | Freq: Every day | INTRAMUSCULAR | Status: DC
  Administered 2012-08-06 – 2012-08-07 (×2): 100 mg via INTRAVENOUS
  Filled 2012-08-06 (×2): qty 1

## 2012-08-06 MED ORDER — RISPERIDONE 1 MG/ML PO SOLN: 2.0000 mg | Freq: Two times a day (BID) | ORAL | Status: DC

## 2012-08-06 MED ORDER — CLONIDINE ORAL SUSPENSION 10 MCG/ML
0.1000 mg | Freq: Every day | ORAL | Status: DC
  Administered 2012-08-06: 0.1 mg via ORAL
  Filled 2012-08-06 (×4): qty 10

## 2012-08-06 MED ORDER — DIVALPROEX SODIUM 125 MG PO CPSP
500.0000 mg | ORAL_CAPSULE | Freq: Once | ORAL | Status: AC
  Administered 2012-08-06: 500 mg via ORAL
  Filled 2012-08-06: qty 4

## 2012-08-06 MED ORDER — DIVALPROEX SODIUM 125 MG PO CPSP
250.0000 mg | ORAL_CAPSULE | Freq: Two times a day (BID) | ORAL | Status: DC
  Administered 2012-08-06 – 2012-08-20 (×24): 250 mg via ORAL
  Filled 2012-08-06 (×31): qty 2

## 2012-08-06 MED ORDER — HALOPERIDOL LACTATE 5 MG/ML IJ SOLN
5.0000 mg | Freq: Three times a day (TID) | INTRAMUSCULAR | Status: DC
  Administered 2012-08-06 – 2012-08-08 (×6): 5 mg via INTRAVENOUS
  Filled 2012-08-06 (×10): qty 1

## 2012-08-06 NOTE — Progress Notes (Signed)
Patient awake and aware enough today to participate in short conversation today with CSW.  Patric states he did go to "[facility]; later confirmed as [facility].  Patient states that he is aware Cocaine and Heroin played role in recent admission to hospital.   Patient unable to consistently state how long he was sober, how long he was at [facility] or otherwise track conversation.  Will continue to follow and access.  Carney Bern LCSWA 08/06/2012 5:32 PM

## 2012-08-06 NOTE — Evaluation (Signed)
Physical Therapy Evaluation Patient Details Name: Martin Chapman MRN: [id number] DOB: [birth year] Today's Date: 08/06/2012 Time: 1438-1500 PT Time Calculation (min): 22 min  PT Assessment / Plan / Recommendation Clinical Impression  pt adm with heroin and alcohol overdose, R shd /arm compartment syndrome and resulting rhabdo.  Pt can benefit from PT to maximize I.  Will see on acute.    PT Assessment  Patient needs continued PT services    Follow Up Recommendations  Post acute inpatient;Supervision for mobility/OOB    Does the patient have the potential to tolerate intense rehabilitation   Yes, Recommend IP Rehab Screening  Barriers to Discharge        Equipment Recommendations  Other (comment) (TBD)    Recommendations for Other Services Rehab consult   Frequency Min 3X/week    Precautions / Restrictions Precautions Precautions: Fall Restrictions Weight Bearing Restrictions: No   Pertinent Vitals/Pain       Mobility  Bed Mobility Bed Mobility: Not assessed Transfers Transfers: Sit to Stand;Stand to Sit Sit to Stand: 1: +2 Total assist;From chair/3-in-1 Sit to Stand: Patient Percentage: 40% Stand to Sit: 1: +2 Total assist;To chair/3-in-1 Stand to Sit: Patient Percentage: 40% Details for Transfer Assistance: assist to control trunk;  vc for reinforcement of safety Ambulation/Gait Ambulation/Gait Assistance: 1: +2 Total assist Ambulation/Gait: Patient Percentage: 40% (to 50%) Ambulation Distance (Feet): 85 Feet Assistive device: 2 person hand held assist Ambulation/Gait Assistance Details: uncoordinated gait and truncal movements Gait Pattern: Step-through pattern;Scissoring;Ataxic;Narrow base of support Stairs: No Wheelchair Mobility Wheelchair Mobility: No    Shoulder Instructions     Exercises     PT Diagnosis: Difficulty walking;Generalized weakness;Altered mental status  PT Problem List:   PT Treatment Interventions: Gait training;Stair  training;Functional mobility training;Therapeutic activities;Balance training;Patient/family education   PT Goals Acute Rehab PT Goals PT Goal Formulation: Patient unable to participate in goal setting Time For Goal Achievement: 08/20/12 Potential to Achieve Goals: Good Pt will go Supine/Side to Sit: with supervision PT Goal: Supine/Side to Sit - Progress: Goal set today Pt will go Sit to Stand: with supervision PT Goal: Sit to Stand - Progress: Goal set today Pt will go Stand to Sit: with supervision PT Goal: Stand to Sit - Progress: Goal set today Pt will Transfer Bed to Chair/Chair to Bed: with supervision PT Transfer Goal: Bed to Chair/Chair to Bed - Progress: Goal set today Pt will Ambulate: >150 feet;with supervision;with least restrictive assistive device PT Goal: Ambulate - Progress: Goal set today  Visit Information  Last PT Received On: 08/06/12 Assistance Needed: +2    Subjective Data  Subjective: I just party Martin... live with my Dad and my girls of course. Patient Stated Goal: pt did not state.   Prior Functioning  Home Living Lives With: Other (Comment) (Dad, Dad's girlfriend) Available Help at Discharge: Other (Comment) (No family available to complete questions) Prior Function Level of Independence: Independent Able to Take Stairs?: Yes Driving: Yes Vocation: Student Communication Communication: No difficulties (talk as if drugged up)    Cognition  Overall Cognitive Status: Impaired Arousal/Alertness: Other (comment) (snowed) Orientation Level: Other (comment) (unable to determine) Behavior During Session: Restless    Extremity/Trunk Assessment Right Lower Extremity Assessment RLE ROM/Strength/Tone: Unable to fully assess;Due to impaired cognition;WFL for tasks assessed Left Lower Extremity Assessment LLE ROM/Strength/Tone: [facility] for tasks assessed;Unable to fully assess;Due to impaired cognition   Balance Balance Balance Assessed: Yes Static Standing  Balance Static Standing - Balance Support: During functional activity;Bilateral upper extremity supported Static  Standing - Level of Assistance: 1: +2 Total assist  End of Session PT - End of Session Equipment Utilized During Treatment: Gait belt Activity Tolerance: Treatment limited secondary to agitation;Other (comment) (and cognition) Patient left: in chair;with call bell/phone within reach;Other (comment) Psychiatrist) Nurse Communication: Mobility status  GP     Martin Chapman, Martin Chapman 08/06/2012, 6:30 PM  08/06/2012  Martin Chapman, PT [phone] (pager)

## 2012-08-06 NOTE — Consult Note (Signed)
Wound care follow-up:  Vac dressing changed to right arm fasciotomy sites.  Sutures intact, wound to anterior and posterior arm beefy red, mod pink drainage, no odor.  Pt tolerated without c/o.  Mepitel applied to each site and one piece black foam over each wound and bridged together to 75mm cont suction.  Track pad placed to right upper arm.  Ace wrap covering site to prevent pt pulling off dressing.  Plan to change Wed.   Cammie Mcgee, RN, MSN, [employer]  [phone]

## 2012-08-06 NOTE — Progress Notes (Signed)
Speech Language Pathology Dysphagia Treatment Patient Details Name: Martin Chapman MRN: [id number] DOB: [birth year] Today's Date: 08/06/2012 Time: 4098-1191 SLP Time Calculation (min): 15 min  Assessment / Plan / Recommendation Clinical Impression  Diagnostic treatment revealed no overt s/s of aspiration with clinician provided po trials. Oral phase mildly prolongued with regular texture solids, however function, due to AMS. SLP provided intermittent liquid wash which assisted in full oral clearance. Overall, current diet remains appropriate when alert. Once patient consistently alert and able to take adequate po, recommend removal of NG tube to facilitate safety and comfort with swallow. At this time however, NG tube does not appear to be overtly impacting function.  (SLP signing off. No further needs indicated. )    Diet Recommendation  Continue with Current Diet: Regular;Thin liquid    SLP Plan All goals met   Pertinent Vitals/Pain n/a   Swallowing Goals  SLP Swallowing Goals Patient will consume recommended diet without observed clinical signs of aspiration with: Maximum assistance Swallow Study Goal #1 - Progress: Met Patient will utilize recommended strategies during swallow to increase swallowing safety with: Maximum assistance Swallow Study Goal #2 - Progress: Met Goal #3: Patient will tolerate trials of sips/chips without s/s of aspiration,  after oral care when alert and able to cooperate Swallow Study Goal #3 - Progress: Met  General Temperature Spikes Noted: Yes Respiratory Status: Room air Behavior/Cognition: Confused;Agitated;Impulsive;Distractible;Requires cueing;Decreased sustained attention;Lethargic Oral Cavity - Dentition: Adequate natural dentition Patient Positioning: Upright in bed     Dysphagia Treatment Treatment focused on: Skilled observation of diet tolerance Treatment Methods/Modalities: Skilled observation Patient observed directly with PO's:  Yes Type of PO's observed: Regular;Thin liquids Feeding: Total assist (due to AMS, need for restaints) Liquids provided via: Straw Oral Phase Signs & Symptoms: Prolonged oral phase Type of cueing: Verbal Amount of cueing: Minimal   GO   [name] MA, CCC-SLP [phone]   Noriah Osgood Meryl 08/06/2012, 3:33 PM

## 2012-08-06 NOTE — Progress Notes (Addendum)
Name: Martin Chapman MRN: [id number] DOB: 1991-01-17    LOS: 15  PULMONARY / CRITICAL CARE MEDICINE  HPI:  21 years old male with PMH relevant for heroin addiction, depression, anxiety and panic attacks. He overdosed on heroin and alcohol 10/19 PM  And 10/20 PM he was found unresponsive at about 2:00 pm. Patient was on vent but extubated 10/27, s/p compartment syndrome and release in OR of R shoulder muscles; Currently with renal failure on HD; Now with C. Diff colitis   Events since admission: 10/20- Admitted to ICU 10/20 - Surgery with shoulder release and fasciotomy of right arm 10/21 - Started on CVVH>>d/c 10/22 10/22 - Return to OR for I&D with wound vac placement. Bicep excised  10/25 - Vac change 10/26 - diarrhea>>C diff positive 10/27 - extubated 10/27 - agitation, precedex started 10/29 - HD cath placement 11/3 - LP performed  Current Status: encpeh, remains on precedex, major agitation; oliguria   Vital Signs: Temp:  [98.1 F (36.7 C)-100 F (37.8 C)] 100 F (37.8 C) (11/04 0619) Pulse Rate:  [79-140] 85  (11/04 0745) Resp:  [8-25] 14  (11/04 0745) BP: (118-151)/(61-99) 127/75 mmHg (11/04 0745) SpO2:  [93 %-100 %] 98 % (11/04 0745) Weight:  [168 lb 14 oz (76.6 kg)] 168 lb 14 oz (76.6 kg) (11/04 0422)  Physical Examination: General - Asleep in bed, no distress, just resedated HEENT - NG in place Cardiac - s1s2 regular Chest - Tunneled HD catheter on left; CTAB Abd - soft, bs wnl , no r Ext - Rt upper arm wrapped in ACE, wound vac in place and much larger than left; Good pulses all extremities. Neuro - sedated, confused   No results found.  ASSESSMENT AND PLAN  PULMONARY No results found for this basename: PHART:5,PCO2:5,PCO2ART:5,PO2ART:5,HCO3:5,O2SAT:5 in the last 168 hours Ventilator Settings: n/a  A:   1) Acute hypoxic respiratory failure likely secondary to aspiration pneumonia / pneumonitis.  Extubated 10/27 with agitation  P:   Adjust  oxygen to keep Spo2 > 92% Aspiration precautions precedex without resp depression higher dose  CARDIOVASCULAR No results found for this basename: TROPONINI:5,LATICACIDVEN:5, O2SATVEN:5,PROBNP:5 in the last 168 hours  Lines:  Left IJ placed 07/22/12 >> R femoral HD cath 10/20>>10/30 Art line L radial 10/21>>10/24 Left tunneled IJ HD cath 10/29>>  Echo 10/22>>EF 55 to 60%  A:  1) Hypovolemic/septic shock>>off pressors 10/24. 2) Lactic acidosis likely secondary to shock and rhabdomyolysis>>resolved 3) Right upper extremity injury with compartment syndrome and possible vascular injury Note arch aortogram in OR normal.  No vascular deficit in RUE/shoulder 4) Elevated troponin P:  Wound care per ortho and wound care team Have increased precedex, BP and HR remain tolerable  RENAL  Lab 08/06/12 0500 08/04/12 0448 08/03/12 0459 08/02/12 0500 08/01/12 0500 07/31/12 0500  NA 138 139 140 139 138 --  K 5.2* 4.1 -- -- -- --  CL 100 100 102 102 99 --  CO2 20 23 26 28 25  --  BUN 74* 32* 41* 27* 52* --  CREATININE 14.61* 8.44* 11.04* 8.05* 11.90* --  CALCIUM 8.7 8.5 8.4 8.2* 8.2* --  MG -- -- -- -- -- 2.3  PHOS 7.9* 5.4* 6.8* 3.0 3.9 --   Intake/Output      11/03 0701 - 11/04 0700 11/04 0701 - 11/05 0700   P.O. 720    I.V. (mL/kg) 898.1 (11.7)    Other 105    NG/GT 390    IV Piggyback     Total  Intake(mL/kg) 2113.1 (27.6)    Urine (mL/kg/hr)     Drains     Total Output     Net +2113.1          Foley:  07/22/12  A:   1) Acute renal failure s/p CRRT>>transitioned to intermittent HD 2) Rhabdomyolysis (resolved) P:   HD per renal in am  Perm cath placement 10/29 Chem in am  With such severe metabolic likely enceph, can we daily HD?  GASTROINTESTINAL  Lab 08/06/12 0500 08/04/12 0448 08/03/12 0459 08/02/12 0500 08/01/12 0500 07/31/12 0500  AST 32 -- -- 83* -- 116*  ALT 12 -- -- 39 -- 98*  ALKPHOS 81 -- -- 77 -- 56  BILITOT 0.4 -- -- 0.3 -- 0.3  PROT 5.8* -- -- 5.2* --  5.0*  ALBUMIN 2.8* 2.6* 2.5* 2.4* 2.4* --    A:   1) Elevated LFT's and INR likely shock liver (improving) 2) Nutrition 3) Diarrhea 10/26 from C diff P:   Slp, diet  HEMATOLOGIC  Lab 08/06/12 0500 08/04/12 1315 08/04/12 0448 08/03/12 0459 08/02/12 0500 08/01/12 0500  HGB 7.5* -- 7.6* 7.3* 7.4* 8.1*  HCT 22.4* -- 22.9* 21.8* 21.8* 22.6*  PLT 467* -- 427* 381 349 378  INR -- 1.24 -- -- -- --  APTT -- -- -- -- -- --   A:   1) Elevated INR likely from shock liver>>resolved 2) Anemia of critical illness 3) Thrombocytopenia>>Thrombocytosis P:  Avoid phlebotomy Epo and aranesp per renal Transfuse for Hb < 7  INFECTIOUS  Lab 08/06/12 0500 08/04/12 0448 08/03/12 0459 08/02/12 0500 08/01/12 0500  WBC 13.1* 13.3* 12.7* 13.1* 14.4*  PROCALCITON -- -- -- -- --   Cultures: Blood culture 10/20>>ng Urine culture 10/20>> negative BCx 10/23>> NGTD UCX 10/23 >>negative C diff 10/26>>Positive BCx 11/1>> LP 11/3>>  Antibiotics: Zosyn 10/20>>10/28 IV Vancomycin 10/20>>10/26 PO vancomycin 10/26>>  A:   1) Aspiration pneumonia. 2) Rt arm compartment syndrome 3) Recurrent fever>>10/23 4) Diarrhea 10/26 from C diff 5) Recurrent fever >>11/1 P:   Continue po vancomycin, establish stop date for 14 days Avoid empiric abx Follow HSV pcr, low suspcion  ENDOCRINE  Lab 08/06/12 0416 08/06/12 0014 08/05/12 2006 08/05/12 1536 08/05/12 1137  GLUCAP 87 98 99 92 91   A:   1) Hyperglycemia>>improved; no Hx of DM P:   POCT glucose TID  NEUROLOGIC EEG: mild encephalopathy MRI brain 11/1: negative qtc- 442 11/4 - B12, Thiamine, ammonia, TSH - WNL HIV-  A:   1) Acute encephalopathy  - favor metabolic, doubt substance withdrawal at this juncture. MR brain reassuring At risk W-K syndrome Baseline depresion, anxiety P:   - continue thiamine, consider high dose IV -assess HIV -Maintain precedex, lower goal to 1 -has not responded well to ser, may need  change to Risperdal, dc all  for now -methadone, ESRD, low dose maintain -schedule haldol 5 q6h -ECG daily for qtc and continous -add 0.1 clonidine q12h -goal reduction ativan in duture -continue prn versed -rpr -Can we HD more aggressive Refractory delirium, consider Depakote load  GLOBAL -attempting new approach on neuro treatment, see above, diet started   BEST PRACTICE / DISPOSITION - Level of Care:  ICU - Primary Service:  PCCM - Consultants:  Vascular surgery, orthopedic surgery, nephrology - Code Status:  Full code - Diet:  passedd swallow - DVT Px:  SCD, Hep SQ - GI Px:  Protonix - Skin Integrity:  Intact - Social / Family:  No family at bedside  Ccm time 30 min severe agitation management  Care during the described time interval was provided by me and/or other providers on the critical care team.  I have reviewed this patient's available data, including medical history, events of note, physical examination and test results as part of my evaluation  Mcarthur Rossetti. Tyson Alias, MD, FACP Pgr: [phone] [facility]  Mat Carne

## 2012-08-06 NOTE — Progress Notes (Signed)
Attempted check of diet tolerance. Pt with large NG in place, just completed HD. Pt was lethargic, not currently aware of attempts to present PO. SLP will defer check of tolerance at this time. Discussed with RN who reports pt has had sips of water and bites of puree and oral meds crushed and as done well with no choking. Pt has not yet been offered a meal today. If pt can sustain attention to meal and accept oral meds, consider d/c of NG tube. SLP will re-attempt diet check today if possible. Harlon Ditty, MA CCC-SLP [phone].

## 2012-08-06 NOTE — Procedures (Signed)
.   I have seen and examined this patient and agree with the plan of care. Patient is comfortable with his dialysis treatment though did require sedation with haldol. Izayah Miner W 08/06/2012, 7:52 AM

## 2012-08-07 DIAGNOSIS — R799 Abnormal finding of blood chemistry, unspecified: Secondary | ICD-10-CM

## 2012-08-07 MED ORDER — MORPHINE SULFATE 2 MG/ML IJ SOLN
1.0000 mg | INTRAMUSCULAR | Status: DC | PRN
  Filled 2012-08-07: qty 1

## 2012-08-07 MED ORDER — SILVER SULFADIAZINE 1 % EX CREA
TOPICAL_CREAM | Freq: Every day | CUTANEOUS | Status: DC
  Administered 2012-08-07 – 2012-08-18 (×11): via TOPICAL
  Filled 2012-08-07 (×3): qty 85

## 2012-08-07 MED ORDER — CLONIDINE HCL 0.1 MG PO TABS
0.1000 mg | ORAL_TABLET | Freq: Every day | ORAL | Status: DC
  Administered 2012-08-07 – 2012-08-10 (×4): 0.1 mg via ORAL
  Filled 2012-08-07 (×6): qty 1

## 2012-08-07 MED ORDER — HEPARIN SODIUM (PORCINE) 1000 UNIT/ML DIALYSIS
20.0000 [IU]/kg | INTRAMUSCULAR | Status: DC | PRN
  Filled 2012-08-07: qty 2

## 2012-08-07 MED ORDER — THIAMINE HCL 100 MG/ML IJ SOLN
200.0000 mg | Freq: Every day | INTRAMUSCULAR | Status: DC
  Administered 2012-08-08: 200 mg via INTRAVENOUS
  Filled 2012-08-07: qty 2

## 2012-08-07 MED ORDER — OXYCODONE HCL 5 MG PO TABS
5.0000 mg | ORAL_TABLET | Freq: Four times a day (QID) | ORAL | Status: DC | PRN
  Administered 2012-08-07 – 2012-08-20 (×31): 5 mg via ORAL
  Filled 2012-08-07 (×30): qty 1

## 2012-08-07 MED ORDER — METHADONE HCL 5 MG PO TABS
5.0000 mg | ORAL_TABLET | Freq: Three times a day (TID) | ORAL | Status: DC
  Administered 2012-08-07 – 2012-08-08 (×2): 5 mg via ORAL
  Administered 2012-08-08: 10 mg via ORAL
  Administered 2012-08-08 – 2012-08-09 (×2): 5 mg via ORAL
  Filled 2012-08-07 (×4): qty 1
  Filled 2012-08-07: qty 2

## 2012-08-07 NOTE — Progress Notes (Signed)
eLink Physician-Brief Progress Note Patient Name: Martin Chapman DOB: 02-10-1991 MRN: [id number]  Date of Service  08/07/2012   HPI/Events of Note   Headache  eICU Interventions  Start Morphine prn      [name] 08/07/2012, 6:25 PM

## 2012-08-07 NOTE — Progress Notes (Signed)
Rehab Admissions Coordinator Note:  Patient was screened by Roseanna Rainbow for appropriateness for an Inpatient Acute Rehab Consult.  Noted patient requiring a sitter and restraints at times due to confusion and agitation. Spoke with pt's father and mother re: plans for discharge. They report that there is no caregiver available after discharge. Due to pt's insurance limitations, pt would be best served at a long term rehab facility. At this time, we are recommending LTACH.   Meryl Dare 08/07/2012, 9:15 AM  I can be reached at [phone].

## 2012-08-07 NOTE — Progress Notes (Signed)
Name: Martin Chapman MRN: [id number] DOB: [birth year]    LOS: 16  PULMONARY / CRITICAL CARE MEDICINE  HPI:  21 years old male with PMH relevant for heroin addiction, depression, anxiety and panic attacks. He overdosed on heroin and alcohol 10/19 PM  And 10/20 PM he was found unresponsive at about 2:00 pm. Patient was on vent but extubated 10/27, s/p compartment syndrome and release in OR of R shoulder muscles; Currently with renal failure on HD; Now with C. Diff colitis   Events since admission: 10/20- Admitted to ICU 10/20 - Surgery with shoulder release and fasciotomy of right arm 10/21 - Started on CVVH>>d/c 10/22 10/22 - Return to OR for I&D with wound vac placement. Bicep excised  10/25 - Vac change 10/26 - diarrhea>>C diff positive 10/27 - extubated 10/27 - agitation, precedex started 10/29 - HD cath placement 11/3 - LP performed  Current Status: depakote given , less precedex  Vital Signs: Temp:  [97.7 F (36.5 C)-99.5 F (37.5 C)] 98.4 F (36.9 C) (11/05 0346) Pulse Rate:  [25-127] 74  (11/05 0700) Resp:  [12-23] 14  (11/05 0700) BP: (104-154)/(59-130) 129/76 mmHg (11/05 0700) SpO2:  [80 %-100 %] 96 % (11/05 0700) Weight:  [162 lb 14.7 oz (73.9 kg)] 162 lb 14.7 oz (73.9 kg) (11/04 1030)  Intake/Output Summary (Last 24 hours) at 08/07/12 0819 Last data filed at 08/07/12 0700  Gross per 24 hour  Intake 1189.87 ml  Output   3828 ml  Net -2638.13 ml    Physical Examination: General - Asleep in bed, no distress, just resedated HEENT - NG in place Cardiac - s1s2 regular Chest - Tunneled HD catheter on left; CTAB Abd - soft, bs wnl , no r Ext - Rt upper arm wrapped in ACE, wound vac removed, 10 mc well healing surgical incision with good granulation tissue generalized edema of RUE; Good pulses all extremities. Neuro - sedated, confused   No results found.  ASSESSMENT AND PLAN  PULMONARY No results found for this basename:  PHART:5,PCO2:5,PCO2ART:5,PO2ART:5,HCO3:5,O2SAT:5 in the last 168 hours Ventilator Settings: n/a  A:   1) Acute hypoxic respiratory failure likely secondary to aspiration pneumonia / pneumonitis.  Extubated 10/27 with agitation  P:   Aspiration precautions precedex without resp depression higher dose  CARDIOVASCULAR No results found for this basename: TROPONINI:5,LATICACIDVEN:5, O2SATVEN:5,PROBNP:5 in the last 168 hours  Lines:  Left IJ placed 07/22/12 >> R femoral HD cath 10/20>>10/30 Art line L radial 10/21>>10/24 Left tunneled IJ HD cath 10/29>>  Echo 10/22>>EF 55 to 60%  A:  1) Hypovolemic/septic shock>>off pressors 10/24. 2) Lactic acidosis likely secondary to shock and rhabdomyolysis>>resolved 3) Right upper extremity injury with compartment syndrome and possible vascular injury Note arch aortogram in OR normal.  No vascular deficit in RUE/shoulder 4) Elevated troponin P:  Wound care per ortho and wound care team Try to decrease precedex since now on new psych regimen  RENAL  Lab 08/06/12 0500 08/04/12 0448 08/03/12 0459 08/02/12 0500 08/01/12 0500  NA 138 139 140 139 138  K 5.2* 4.1 -- -- --  CL 100 100 102 102 99  CO2 20 23 26 28 25   BUN 74* 32* 41* 27* 52*  CREATININE 14.61* 8.44* 11.04* 8.05* 11.90*  CALCIUM 8.7 8.5 8.4 8.2* 8.2*  MG -- -- -- -- --  PHOS 7.9* 5.4* 6.8* 3.0 3.9   Intake/Output      11/04 0701 - 11/05 0700 11/05 0701 - 11/06 0700   P.O. 400  I.V. (mL/kg) 834 (11.3)    Other     NG/GT 0    IV Piggyback 110    Total Intake(mL/kg) 1344 (18.2)    Urine (mL/kg/hr) 325 (0.2)    Other 3500    Stool 3    Total Output 3828    Net -2484         Stool Occurrence 2 x     Foley:  07/22/12  A:   1) Acute renal failure s/p CRRT>>transitioned to intermittent HD 2) Rhabdomyolysis (resolved) P:   HD per renal in am  Perm cath placement 10/29 Renal team does not believe mental status related to uremia, so no indication for daily  HD  GASTROINTESTINAL  Lab 08/06/12 0500 08/04/12 0448 08/03/12 0459 08/02/12 0500 08/01/12 0500  AST 32 -- -- 83* --  ALT 12 -- -- 39 --  ALKPHOS 81 -- -- 77 --  BILITOT 0.4 -- -- 0.3 --  PROT 5.8* -- -- 5.2* --  ALBUMIN 2.8* 2.6* 2.5* 2.4* 2.4*    A:   1) Elevated LFT's and INR likely shock liver (improving) 2) Nutrition 3) Diarrhea 10/26 from C diff P:   Slp, renal diet Follow closelt mentation to limit diet  HEMATOLOGIC  Lab 08/06/12 0500 08/04/12 1315 08/04/12 0448 08/03/12 0459 08/02/12 0500 08/01/12 0500  HGB 7.5* -- 7.6* 7.3* 7.4* 8.1*  HCT 22.4* -- 22.9* 21.8* 21.8* 22.6*  PLT 467* -- 427* 381 349 378  INR -- 1.24 -- -- -- --  APTT -- -- -- -- -- --   A:   1) Elevated INR likely from shock liver>>resolved 2) Anemia of critical illness, dilutional  3) Thrombocytopenia>>Thrombocytosis P:  Avoid phlebotomy Epo and aranesp per renal Transfuse for Hb < 7 Neg balance , follow cbc  INFECTIOUS  Lab 08/06/12 0500 08/04/12 0448 08/03/12 0459 08/02/12 0500 08/01/12 0500  WBC 13.1* 13.3* 12.7* 13.1* 14.4*  PROCALCITON -- -- -- -- --   Cultures: Blood culture 10/20>>ng Urine culture 10/20>> negative BCx 10/23>> NGTD UCX 10/23 >>negative C diff 10/26>>Positive BCx 11/1>> LP 11/3>> negative HSV in CSF HIV neg RPR neg   Antibiotics: Zosyn 10/20>>10/28 IV Vancomycin 10/20>>10/26 PO vancomycin 10/26>>  A:   1) Aspiration pneumonia. 2) Rt arm compartment syndrome 3) Recurrent fever>>10/23 4) Diarrhea 10/26 from C diff 5) Recurrent fever >>11/1 P:   Continue po vancomycin, establish stop date for 14 days Avoid empiric abx HSV neg  ENDOCRINE  Lab 08/06/12 2027 08/06/12 1608 08/06/12 1118 08/06/12 0821 08/06/12 0416  GLUCAP 97 100* 87 94 87   A:   1) Hyperglycemia>>improved; no Hx of DM P:   POCT glucose TID  NEUROLOGIC EEG: mild encephalopathy MRI brain 11/1: negative qtc- 442 11/4 - B12, Thiamine, ammonia, TSH - WNL HIV- neg rpr neg A:    1) Acute encephalopathy  - favor metabolic, doubt substance withdrawal at this juncture. MR brain reassuring At risk W-K syndrome Baseline depresion, anxiety P:   - continue thiamine, consider high dose IV, today 200 -assess HIV - neg -Maintain precedex, lower goal to dc today -methadone, ESRD, low dose maintain, reduce 5 q8h -schedule haldol 5 q6h, if needed double haldol to 10, again goal to dc precedex -ECG daily for qtc and continous - 472 today 11/5 -Tolerating 0.1 clonidine q12h -goal reduction ativan in future -continue prn versed - Refractory delirium Continue Depakote Dep level in 3 days  GLOBAL -Have made progress with precedex redcution   BEST PRACTICE / DISPOSITION -  Level of Care:  ICU - Primary Service:  PCCM - Consultants:  Vascular surgery, orthopedic surgery, nephrology - Code Status:  Full code - Diet:  passedd swallow - DVT Px:  SCD, Hep SQ - GI Px:  Protonix - Skin Integrity:  Intact - Social / Family:  No family at bedside   Si Raider. Clinton Sawyer, MD, MBA 08/07/2012, 8:18 AM Family Medicine Resident, PGY-2 [phone] pager   Mat Carne  Ccm time 30 min   I have fully examined this patient and agree with above findings.    And edited in full; history obtain ed personally.  Mcarthur Rossetti. Tyson Alias, MD, FACP Pgr: [phone] [facility] \

## 2012-08-07 NOTE — Progress Notes (Signed)
Physical Therapy Treatment Patient Details Name: Martin Chapman MRN: [id number] DOB: 24-Sep-1991 Today's Date: 08/07/2012 Time: 6213-0865 PT Time Calculation (min): 27 min  PT Assessment / Plan / Recommendation Comments on Treatment Session  Much improved mobility; still confused intermittentluy    Follow Up Recommendations  SNF;Supervision/Assistance - 24 hour     Does the patient have the potential to tolerate intense rehabilitation     Barriers to Discharge        Equipment Recommendations       Recommendations for Other Services    Frequency Min 3X/week   Plan Frequency remains appropriate;Discharge plan needs to be updated    Precautions / Restrictions Precautions Precautions: Fall Restrictions Weight Bearing Restrictions: No   Pertinent Vitals/Pain 6-7/10 R UE    Mobility  Bed Mobility Bed Mobility: Supine to Sit;Sitting - Scoot to [place] of Bed;Sit to Supine Supine to Sit: 4: Min assist Sitting - Scoot to [place] of Bed: 4: Min assist Sit to Supine: 4: Min guard Details for Bed Mobility Assistance: minimal assist to support for painful limbs Transfers Transfers: Sit to Stand;Stand to Sit Sit to Stand: 1: +2 Total assist;With upper extremity assist;From bed Sit to Stand: Patient Percentage: 70% Stand to Sit: 3: Mod assist;To bed Details for Transfer Assistance: assist to control trunk;  vc for reinforcement of safety Ambulation/Gait Ambulation/Gait Assistance: 1: +2 Total assist Ambulation/Gait: Patient Percentage: 70% Ambulation Distance (Feet): 80 Feet Assistive device: 2 person hand held assist Ambulation/Gait Assistance Details: progressively heavier tremors, uncoordinated gait, but much improved over 11/4 Gait Pattern: Step-through pattern;Decreased step length - right;Decreased step length - left;Decreased stride length (limp on R) Stairs: No Wheelchair Mobility Wheelchair Mobility: No    Exercises     PT Diagnosis:    PT Problem List:   PT  Treatment Interventions:     PT Goals Acute Rehab PT Goals Time For Goal Achievement: 08/20/12 Potential to Achieve Goals: Good PT Goal: Supine/Side to Sit - Progress: Progressing toward goal PT Goal: Sit to Stand - Progress: Progressing toward goal PT Goal: Stand to Sit - Progress: Progressing toward goal PT Transfer Goal: Bed to Chair/Chair to Bed - Progress: Progressing toward goal PT Goal: Ambulate - Progress: Progressing toward goal  Visit Information  Last PT Received On: 08/07/12 Assistance Needed: +2 PT/OT Co-Evaluation/Treatment: Yes    Subjective Data  Subjective: my R arm hurts 6-7 when I move it, but my other shoulder hurts too   Cognition  Overall Cognitive Status: Impaired Area of Impairment: Attention;Memory;Safety/judgement;Awareness of deficits;Problem solving Arousal/Alertness: Other (comment) Behavior During Session: Other (comment) (decr attension, confused intermittently)    Balance  Balance Balance Assessed: Yes Dynamic Standing Balance Dynamic Standing - Balance Support: Bilateral upper extremity supported;During functional activity Dynamic Standing - Level of Assistance: 1: +2 Total assist;Patient percentage (comment) (pt 70%)  End of Session PT - End of Session Activity Tolerance: Patient limited by fatigue Patient left: in bed;with call bell/phone within reach Nurse Communication: Mobility status   GP     Shermeka Rutt, Eliseo Gum 08/07/2012, 4:05 PM 08/07/2012  Seward Bing, PT [phone] (pager)

## 2012-08-07 NOTE — Progress Notes (Signed)
NEURO HOSPITALIST PROGRESS NOTE   SUBJECTIVE:                                                                                                                         Patient is on Precedex due to agitation.  Per nurse he has been up all night.  OBJECTIVE:                                                                                                                           Vital signs in last 24 hours: Temp:  [97.7 F (36.5 C)-99.5 F (37.5 C)] 98.4 F (36.9 C) (11/05 0346) Pulse Rate:  [25-127] 74  (11/05 0700) Resp:  [12-23] 14  (11/05 0700) BP: (104-154)/(59-130) 129/76 mmHg (11/05 0700) SpO2:  [80 %-100 %] 96 % (11/05 0700) Weight:  [73.9 kg (162 lb 14.7 oz)] 73.9 kg (162 lb 14.7 oz) (11/04 1030)  Intake/Output from previous day: 11/04 0701 - 11/05 0700 In: 1344 [P.O.:400; I.V.:834; IV Piggyback:110] Out: 3828 [Urine:325; Stool:3] Intake/Output this shift:   Nutritional status: Renal  Past Medical History  Diagnosis Date  . Anxiety   . Panic attacks   . Depression     Neurologic Exam:  Mental Status: He is sedated, follow no commands.  Cranial Nerves: II: blink to threat bilaterally,  pupils equal, round, reactive to light and accommodation III,IV, VI: positive doll's, EOMI V,VII: Face symmetric, VIII: opens eyes breifly IX,X: gag reflex present XI: bilateral shoulder shrug XII: midline tongue extension Motor: Withdraws to pain all 4 extremities. Does not follow commands due to highly sedated.  Sensory: withdraws to pain Deep Tendon Reflexes: 2+ and symmetric throughout with nonsustained clonus bilateral achilles.  Plantars: Right: downgoing   Left: downgoing CV: pulses palpable throughout     Lab Results: No results found for this basename: cbc, bmp, coags, chol, tri, ldl, hga1c   Lipid Panel No results found for this basename: CHOL,TRIG,HDL,CHOLHDL,VLDL,LDLCALC in the last 72 hours  Studies/Results: No results  found.  MEDICATIONS  Scheduled:   . Chlorhexidine Gluconate Cloth  6 each Topical Q0600  . cloNIDine  0.1 mg Oral QHS  . darbepoetin (ARANESP) injection - DIALYSIS  150 mcg Intravenous Q Sat-HD  . [COMPLETED] divalproex  500 mg Oral Once   Followed by  . divalproex  250 mg Oral Q12H  . famotidine  20 mg Oral Daily  . ferric gluconate (FERRLECIT/NULECIT) IV  125 mg Intravenous Q M,W,F-HD  . haloperidol lactate  5 mg Intravenous Q8H  . heparin subcutaneous  5,000 Units Subcutaneous Q8H  . LORazepam  2 mg Oral TID  . methadone  5 mg Oral Q6H  . multivitamin  1 tablet Oral QHS  . saccharomyces boulardii  250 mg Oral BID  . silver sulfADIAZINE   Topical Daily  . thiamine  100 mg Intravenous Daily  . vancomycin  500 mg Per Tube Q6H  . [DISCONTINUED] QUEtiapine  200 mg Oral BID  . [DISCONTINUED] risperiDONE  2 mg Oral q12n4p  . [DISCONTINUED] thiamine  100 mg Oral Daily   MRI brain:  IMPRESSION:  Except for mild premature cerebral and cerebellar atrophy, there is  no visible acute intracranial abnormality. No signs of hypoxic  ischemic encephalopathy or acute/subacute infarction. No foci of  chronic hemorrhage. No evidence for hepatic encephalopathy  metabolic changes.  ASSESSMENT/PLAN:                                                                                                               Patient Active Hospital Problem List:   Encephalopathy (07/30/2012)   Assessment: 21 YO male with ICU delirium in setting of drug overdose, rhabdomyolysis, compartment syndrome.  MRI has returned showing no acute infarct, LP shows no HSV infection, 4 WBC, few neutrophils, rare lymphocytes and rare monocytes. At this time feel likely cause of confusion is persistent ICU delirium.   Plan:  1)Continue to minimize sedation 2) Continue to keep alert during the day and allow  to rest at night  No further neurological recommendations.   Neurology will S/O   Felicie Morn PA-C [facility] [phone]  08/07/2012, 8:49 AM    I have seen and evaluated the patient. I have reviewed the above note and made appropriate changes. Will sign off at this time.   Ritta Slot, MD [facility] [phone]  If 7pm- 7am, please page neurology on call at [phone].

## 2012-08-07 NOTE — Progress Notes (Signed)
Called to pt room due to complaints of worsening RUE pain.  Pt reports difficulty getting comfortable.  Pain in hand, tips of fingers and Upper arm where the stitches are. On eval, pt with swelling R>L UE, bandage in place on R upper arm.  R UE = Diminished grip strength and difficulty full extending fingers.  Pain with movements but able to move.  Pain not out of proportion to known injury.  Neurovascularly intact with <1sec capillary refill.  Pt mental status appears to be significantly better and this is confirmed by parents and nursing.    A: Increased RUE pain s/p fasciotomy 2o/2 compartment syndrome, with resolving delirium.  Following acute OD from Heroin.   Pt with clearing mentation now noting increasing pain especially following both PT and OT. On Methadone 5mg  TID Will add Oxycodone 5mg  IR q6o prn for breakthrough pain.  Hold for worsening mentation.    Andrena Mews, DO [facility] Resident - PGY-2 08/07/2012 6:03 PM

## 2012-08-07 NOTE — Progress Notes (Signed)
Patient ID: Martin Chapman, male   DOB: November 25, 1990, 21 y.o.   MRN: [id number] Wound VAC removed this morning. Wounds showed good beefy granulation tissue plan to start Silvadene dressing changes daily to the right arm. Occupational therapy to work aggressively with range of motion of the shoulder elbow right upper extremity

## 2012-08-07 NOTE — Evaluation (Signed)
Occupational Therapy Evaluation Patient Details Name: MATAIO MELE MRN: [id number] DOB: 27-Sep-1991 Today's Date: 08/07/2012 Time: 1478-2956 OT Time Calculation (min): 27 min  OT Assessment / Plan / Recommendation Clinical Impression  This 21 y.o. admitted for Heroine and ETOH overdose.  Pt. with compartment syndrome and underwent fasciotomy Lt. UE.  pt. demonstrates the below listed deficits and will benefit from OT to maximize safety and independence with BADLs to allow pt. to returm to modified independent level after post acute rehab    OT Assessment  Patient needs continued OT Services    Follow Up Recommendations  CIR;Supervision/Assistance - 24 hour    Barriers to Discharge Decreased caregiver support    Equipment Recommendations  Tub/shower seat    Recommendations for Other Services Rehab consult  Frequency  Min 2X/week    Precautions / Restrictions Precautions Precautions: Fall Restrictions Weight Bearing Restrictions: No   Pertinent Vitals/Pain     ADL  Eating/Feeding: Set up Where Assessed - Eating/Feeding: Bed level Grooming: Wash/dry hands;Wash/dry face;Teeth care;Moderate assistance Where Assessed - Grooming: Unsupported sitting Upper Body Bathing: Maximal assistance Where Assessed - Upper Body Bathing: Unsupported sitting Lower Body Bathing: Maximal assistance Where Assessed - Lower Body Bathing: Supported sit to stand Upper Body Dressing: +1 Total assistance Where Assessed - Upper Body Dressing: Unsupported sitting Lower Body Dressing: Maximal assistance Where Assessed - Lower Body Dressing: Supported sit to Pharmacist, hospital: +2 Total assistance Toilet Transfer: Patient Percentage: 70% Toilet Transfer Method: Sit to stand;Stand pivot Acupuncturist: Bedside commode Toileting - Clothing Manipulation and Hygiene: Maximal assistance Where Assessed - Glass blower/designer Manipulation and Hygiene: Standing Transfers/Ambulation Related  to ADLs: total A +2 (pt ~70%) ADL Comments: Pt. able to doff socks, but max A to don.  Does not attempt to use Rt. UE    OT Diagnosis: Generalized weakness;Acute pain  OT Problem List: Decreased strength;Decreased range of motion;Decreased activity tolerance;Impaired balance (sitting and/or standing);Decreased coordination;Decreased cognition;Decreased safety awareness;Decreased knowledge of use of DME or AE;Pain;Impaired UE functional use OT Treatment Interventions: Self-care/ADL training;Therapeutic exercise;Neuromuscular education;DME and/or AE instruction;Therapeutic activities;Cognitive remediation/compensation;Patient/family education;Balance training   OT Goals Acute Rehab OT Goals OT Goal Formulation: Patient unable to participate in goal setting Time For Goal Achievement: 08/21/12 Potential to Achieve Goals: Good ADL Goals Pt Will Perform Grooming: with min assist;Standing at sink (with RUE) ADL Goal: Grooming - Progress: Goal set today Pt Will Perform Upper Body Bathing: with set-up;Sitting, chair;Sitting, edge of bed ADL Goal: Upper Body Bathing - Progress: Goal set today Pt Will Perform Lower Body Bathing: with supervision;Sit to stand from chair;Sit to stand from bed ADL Goal: Lower Body Bathing - Progress: Goal set today Pt Will Perform Lower Body Dressing: with supervision;Sit to stand from chair;Sit to stand from bed ADL Goal: Lower Body Dressing - Progress: Goal set today Pt Will Transfer to Toilet: with supervision;Ambulation;with DME ADL Goal: Toilet Transfer - Progress: Goal set today Pt Will Perform Toileting - Clothing Manipulation: with supervision;Standing ADL Goal: Toileting - Clothing Manipulation - Progress: Goal set today Additional ADL Goal #1: Pt. will demonstrate full AROM Lt. UE as needed for ADLs ADL Goal: Additional Goal #1 - Progress: Goal set today  Visit Information  Last OT Received On: 08/07/12 Assistance Needed: +2    Subjective Data   Subjective: "I've gotta lay down" Patient Stated Goal: Pt unable to state   Prior Functioning     Home Living Lives With: Other (Comment);Family (mother) Available Help at Discharge:  (No family available) Additional  Comments: Pt unable to answer questions correctly Prior Function Level of Independence: Independent Able to Take Stairs?: Yes Driving: Yes Vocation: Student Communication Communication: No difficulties Dominant Hand: Right         Vision/Perception Vision - Assessment Vision Assessment: Vision not tested Perception Perception: Within Functional Limits Praxis Praxis: Intact   Cognition  Overall Cognitive Status: Impaired Area of Impairment: Attention;Memory;Safety/judgement;Awareness of deficits;Problem solving Arousal/Alertness: Other (comment) Orientation Level: Disoriented to;Time (intermittently confused/disoriented) Behavior During Session: Other (comment) (decr attension, confused intermittently) Current Attention Level: Sustained Memory Deficits: Unable to recall earlier events Safety/Judgement: Decreased safety judgement for tasks assessed;Impulsive;Decreased awareness of need for assistance Problem Solving: max A for novel tasks    Extremity/Trunk Assessment Right Upper Extremity Assessment RUE ROM/Strength/Tone: Deficits RUE ROM/Strength/Tone Deficits: Rt. UE 1+/5; AAROM ~75% shoulder and elbow RUE Coordination: Deficits RUE Coordination Deficits: gross grasp/release Left Upper Extremity Assessment LUE ROM/Strength/Tone: Within functional levels LUE Coordination: WFL - gross/fine motor Trunk Assessment Trunk Assessment: Other exceptions Trunk Exceptions: Pt. maintains head/neck laterally flexed to Lt.  with cues/prompting will correct     Mobility Bed Mobility Bed Mobility: Supine to Sit;Sitting - Scoot to [place] of Bed;Sit to Supine Supine to Sit: 4: Min assist Sitting - Scoot to [place] of Bed: 4: Min assist Sit to Supine: 4: Min  guard Details for Bed Mobility Assistance: minimal assist to support for painful limbs Transfers Transfers: Sit to Stand;Stand to Sit Sit to Stand: 1: +2 Total assist;With upper extremity assist;From bed Sit to Stand: Patient Percentage: 70% Stand to Sit: 3: Mod assist;To bed Stand to Sit: Patient Percentage: 70% Details for Transfer Assistance: assist to control trunk;  vc for reinforcement of safety     Shoulder Instructions     Exercise General Exercises - Upper Extremity Shoulder Flexion: AAROM;Left;Seated (8 reps) Elbow Flexion: AAROM;Left;Seated (2 reps) Elbow Extension: AAROM;Left;Seated (2 reps)   Balance Balance Balance Assessed: Yes Dynamic Standing Balance Dynamic Standing - Balance Support: Bilateral upper extremity supported;During functional activity Dynamic Standing - Level of Assistance: 1: +2 Total assist;Patient percentage (comment) (pt 70%)   End of Session OT - End of Session Activity Tolerance: Patient limited by fatigue;Patient limited by pain Patient left: in bed;with call bell/phone within reach Nurse Communication: Mobility status  GO     Jeani Hawking M 08/07/2012, 4:38 PM

## 2012-08-07 NOTE — Progress Notes (Signed)
[name] ROUNDING NOTE   Subjective:   Interval History:resting without complaints. Tolerated dialysis treatment 11/4  Objective:  Vital signs in last 24 hours:  Temp:  [97.7 F (36.5 C)-99.5 F (37.5 C)] 98.4 F (36.9 C) (11/05 0346) Pulse Rate:  [25-127] 74  (11/05 0700) Resp:  [12-23] 14  (11/05 0700) BP: (104-154)/(59-130) 129/76 mmHg (11/05 0700) SpO2:  [80 %-100 %] 96 % (11/05 0700) Weight:  [73.9 kg (162 lb 14.7 oz)] 73.9 kg (162 lb 14.7 oz) (11/04 1030)  Weight change: -2.7 kg (-5 lb 15.2 oz) Filed Weights   08/05/12 0500 08/06/12 0422 08/06/12 1030  Weight: 78.1 kg (172 lb 2.9 oz) 76.6 kg (168 lb 14 oz) 73.9 kg (162 lb 14.7 oz)    Intake/Output: I/O last 3 completed shifts: In: 1863.2 [P.O.:400; I.V.:1353.2; IV Piggyback:110] Out: 3828 [Urine:325; Other:3500; Stool:3]   Intake/Output this shift:     General appearance: mumbles, does obey some commands  Resp: diminished breath sounds bilaterally and rhonchi bilaterally  Cardio: S1, S2 normal and systolic murmur: holosystolic 2/6, blowing at apex  GI: abnormal findings: pos bs, liver down 4 cm soft  Extremities: edema 1+    Basic Metabolic Panel:  Lab 08/06/12 4098 08/04/12 0448 08/03/12 0459 08/02/12 0500 08/01/12 0500  NA 138 139 140 139 138  K 5.2* 4.1 3.9 3.8 3.4*  CL 100 100 102 102 99  CO2 20 23 26 28 25   GLUCOSE 92 109* 119* 97 100*  BUN 74* 32* 41* 27* 52*  CREATININE 14.61* 8.44* 11.04* 8.05* 11.90*  CALCIUM 8.7 8.5 8.4 -- --  MG -- -- -- -- --  PHOS 7.9* 5.4* 6.8* 3.0 3.9    Liver Function Tests:  Lab 08/06/12 0500 08/04/12 0448 08/03/12 0459 08/02/12 0500 08/01/12 0500  AST 32 -- -- 83* --  ALT 12 -- -- 39 --  ALKPHOS 81 -- -- 77 --  BILITOT 0.4 -- -- 0.3 --  PROT 5.8* -- -- 5.2* --  ALBUMIN 2.8* 2.6* 2.5* 2.4* 2.4*   No results found for this basename: LIPASE:5,AMYLASE:5 in the last 168 hours  Lab 08/02/12 1835  AMMONIA 16    CBC:  Lab 08/06/12 0500  08/04/12 0448 08/03/12 0459 08/02/12 0500 08/01/12 0500  WBC 13.1* 13.3* 12.7* 13.1* 14.4*  NEUTROABS 9.6* -- -- -- --  HGB 7.5* 7.6* 7.3* 7.4* 8.1*  HCT 22.4* 22.9* 21.8* 21.8* 22.6*  MCV 85.5 87.4 87.2 85.2 85.0  PLT 467* 427* 381 349 378    Cardiac Enzymes: No results found for this basename: CKTOTAL:5,CKMB:5,CKMBINDEX:5,TROPONINI:5 in the last 168 hours  BNP: No components found with this basename: POCBNP:5  CBG:  Lab 08/06/12 2027 08/06/12 1608 08/06/12 1118 08/06/12 0821 08/06/12 0416  GLUCAP 97 100* 87 94 87    Microbiology: Results for orders placed during the hospital encounter of 07/22/12  URINE CULTURE     Status: Normal   Collection Time   07/22/12  4:32 PM      Component Value Range Status Comment   Specimen Description URINE, CATHETERIZED   Final    Special Requests NONE   Final    Culture  Setup Time 07/23/2012 11:13   Final    Colony Count NO GROWTH   Final    Culture NO GROWTH   Final    Report Status 07/24/2012 FINAL   Final   CULTURE, BLOOD (ROUTINE X 2)     Status: Normal   Collection Time   07/22/12  6:42  PM      Component Value Range Status Comment   Specimen Description BLOOD LEFT ARM   Final    Special Requests BOTTLES DRAWN AEROBIC ONLY   Final    Culture  Setup Time 07/23/2012 10:16   Final    Culture NO GROWTH 5 DAYS   Final    Report Status 07/29/2012 FINAL   Final   CULTURE, BLOOD (ROUTINE X 2)     Status: Normal   Collection Time   07/22/12  6:51 PM      Component Value Range Status Comment   Specimen Description BLOOD RIGHT ARM   Final    Special Requests BOTTLES DRAWN AEROBIC ONLY   Final    Culture  Setup Time 07/23/2012 10:16   Final    Culture NO GROWTH 5 DAYS   Final    Report Status 07/29/2012 FINAL   Final   MRSA PCR SCREENING     Status: Normal   Collection Time   07/23/12 12:30 AM      Component Value Range Status Comment   MRSA by PCR NEGATIVE  NEGATIVE Final   CULTURE, BLOOD (ROUTINE X 2)     Status: Normal    Collection Time   07/25/12  8:30 AM      Component Value Range Status Comment   Specimen Description BLOOD LEFT HAND   Final    Special Requests BOTTLES DRAWN AEROBIC AND ANAEROBIC 10CC   Final    Culture  Setup Time 07/25/2012 13:56   Final    Culture NO GROWTH 5 DAYS   Final    Report Status 07/31/2012 FINAL   Final   CULTURE, BLOOD (ROUTINE X 2)     Status: Normal   Collection Time   07/25/12  8:40 AM      Component Value Range Status Comment   Specimen Description BLOOD RIGHT HAND   Final    Special Requests BOTTLES DRAWN AEROBIC AND ANAEROBIC 10CC   Final    Culture  Setup Time 07/25/2012 13:56   Final    Culture NO GROWTH 5 DAYS   Final    Report Status 07/31/2012 FINAL   Final   URINE CULTURE     Status: Normal   Collection Time   07/25/12  8:49 AM      Component Value Range Status Comment   Specimen Description URINE, CATHETERIZED   Final    Special Requests NONE   Final    Culture  Setup Time 07/25/2012 15:30   Final    Colony Count NO GROWTH   Final    Culture NO GROWTH   Final    Report Status 07/26/2012 FINAL   Final   CLOSTRIDIUM DIFFICILE BY PCR     Status: Abnormal   Collection Time   07/28/12  6:15 AM      Component Value Range Status Comment   C difficile by pcr POSITIVE (*) NEGATIVE Final   CULTURE, BLOOD (ROUTINE X 2)     Status: Normal (Preliminary result)   Collection Time   08/03/12  2:37 PM      Component Value Range Status Comment   Specimen Description BLOOD RIGHT HAND   Final    Special Requests BOTTLES DRAWN AEROBIC AND ANAEROBIC 10CC   Final    Culture  Setup Time 08/03/2012 21:42   Final    Culture     Final    Value:        BLOOD CULTURE  RECEIVED NO GROWTH TO DATE CULTURE WILL BE HELD FOR 5 DAYS BEFORE ISSUING A FINAL NEGATIVE REPORT   Report Status PENDING   Incomplete   CULTURE, BLOOD (ROUTINE X 2)     Status: Normal (Preliminary result)   Collection Time   08/03/12  3:51 PM      Component Value Range Status Comment   Specimen Description  BLOOD ARM LEFT   Final    Special Requests BOTTLES DRAWN AEROBIC ONLY 10CC   Final    Culture  Setup Time 08/03/2012 21:42   Final    Culture     Final    Value:        BLOOD CULTURE RECEIVED NO GROWTH TO DATE CULTURE WILL BE HELD FOR 5 DAYS BEFORE ISSUING A FINAL NEGATIVE REPORT   Report Status PENDING   Incomplete   CSF CULTURE     Status: Normal (Preliminary result)   Collection Time   08/05/12  5:50 PM      Component Value Range Status Comment   Specimen Description CSF   Final    Special Requests NONE   Final    Gram Stain     Final    Value: CYTOSPIN NO WBC SEEN     NO ORGANISMS SEEN   Culture PENDING   Incomplete    Report Status PENDING   Incomplete     Coagulation Studies:  Basename 08/04/12 1315  LABPROT 15.4*  INR 1.24    Urinalysis: No results found for this basename: COLORURINE:2,APPERANCEUR:2,LABSPEC:2,PHURINE:2,GLUCOSEU:2,HGBUR:2,BILIRUBINUR:2,KETONESUR:2,PROTEINUR:2,UROBILINOGEN:2,NITRITE:2,LEUKOCYTESUR:2 in the last 72 hours    Imaging: No results found.   Medications:      . sodium chloride 20 mL/hr at 08/05/12 0645  . dexmedetomidine 1.1 mcg/kg/hr (08/07/12 0414)  . dextrose 5 % and 0.9% NaCl 10 mL/hr at 07/29/12 0914  . feeding supplement (NEPRO CARB STEADY) 1,000 mL (08/04/12 0244)      . Chlorhexidine Gluconate Cloth  6 each Topical Q0600  . cloNIDine  0.1 mg Oral QHS  . darbepoetin (ARANESP) injection - DIALYSIS  150 mcg Intravenous Q Sat-HD  . [COMPLETED] divalproex  500 mg Oral Once   Followed by  . divalproex  250 mg Oral Q12H  . famotidine  20 mg Oral Daily  . ferric gluconate (FERRLECIT/NULECIT) IV  125 mg Intravenous Q M,Chapman,F-HD  . haloperidol lactate  5 mg Intravenous Q8H  . heparin subcutaneous  5,000 Units Subcutaneous Q8H  . LORazepam  2 mg Oral TID  . methadone  5 mg Oral Q6H  . multivitamin  1 tablet Oral QHS  . saccharomyces boulardii  250 mg Oral BID  . silver sulfADIAZINE   Topical Daily  . thiamine  100 mg Intravenous  Daily  . vancomycin  500 mg Per Tube Q6H  . [DISCONTINUED] QUEtiapine  200 mg Oral BID  . [DISCONTINUED] risperiDONE  2 mg Oral q12n4p  . [DISCONTINUED] thiamine  100 mg Oral Daily   sodium chloride, sodium chloride, acetaminophen (TYLENOL) oral liquid 160 mg/5 mL, feeding supplement (NEPRO CARB STEADY), feeding supplement (NEPRO CARB STEADY), heparin, heparin, heparin, heparin, heparin, heparin, heparin, heparin, heparin, heparin, lidocaine, lidocaine, lidocaine-prilocaine, lidocaine-prilocaine, LORazepam, ondansetron, pentafluoroprop-tetrafluoroeth, pentafluoroprop-tetrafluoroeth  Assessment/ Plan:  pt adm with heroin and alcohol overdose, R shd /arm compartment syndrome and resulting rhabdo   10/20- Admitted to ICU  10/20 - Surgery with shoulder release and fasciotomy of right arm  10/21 - Started on CVVH>>d/c 10/22  10/22 - Return to OR for I&D with wound vac placement. Bicep excised  10/25 - Vac change  10/26 - diarrhea>>C diff positive  10/27 - extubated  10/27 - agitation, precedex started  10/29 - HD cath placement   Acute Renal failure secondary to ATN and Rhabdomyolysis. There appears to be no sign of recovery of renal function at present. Urine output marginal. Will follow. Plan dialysis in AM   LOS: 16 Martin Chapman @TODAY @8 :02 AM

## 2012-08-07 NOTE — Procedures (Signed)
History: 21 yo M with altered mental status after heroin overdose.   Background: There is a moderately sustained posterior dominant rhythm of 8.5 Hz that attenuates with eye opening. There is generalized irregular delta and theta activity that persists throughout the recording.   Photic stimulation: not performed  EEG Diagnosis: 1) Generalized slow activity  Clinical Interpretation: This abnormal EEG is recorded in the waking and drowsy state. There is evidence of a mild nonspecific generalized cerebral dysfunction(encephalopathy) There was no seizure or seizure predisposition recorded on this study.   Brenten Janney, MD [facility] [phone]  If 7pm- 7am, please page neurology on call at [phone].   

## 2012-08-08 ENCOUNTER — Inpatient Hospital Stay (HOSPITAL_COMMUNITY): Payer: BC Managed Care – PPO

## 2012-08-08 LAB — CBC
HCT: 20.7 % — ABNORMAL LOW (ref 39.0–52.0)
Hemoglobin: 7 g/dL — ABNORMAL LOW (ref 13.0–17.0)
MCH: 28.8 pg (ref 26.0–34.0)
MCHC: 33.8 g/dL (ref 30.0–36.0)
RDW: 14.2 % (ref 11.5–15.5)

## 2012-08-08 LAB — RENAL FUNCTION PANEL
Albumin: 2.7 g/dL — ABNORMAL LOW (ref 3.5–5.2)
BUN: 57 mg/dL — ABNORMAL HIGH (ref 6–23)
Calcium: 8.4 mg/dL (ref 8.4–10.5)
Creatinine, Ser: 12.01 mg/dL — ABNORMAL HIGH (ref 0.50–1.35)
Glucose, Bld: 88 mg/dL (ref 70–99)
Phosphorus: 8.2 mg/dL — ABNORMAL HIGH (ref 2.3–4.6)

## 2012-08-08 MED ORDER — VITAMIN B-1 100 MG PO TABS
100.0000 mg | ORAL_TABLET | Freq: Every day | ORAL | Status: DC
  Administered 2012-08-09 – 2012-08-20 (×11): 100 mg via ORAL
  Filled 2012-08-08 (×14): qty 1

## 2012-08-08 MED ORDER — BOOST / RESOURCE BREEZE PO LIQD
1.0000 | Freq: Three times a day (TID) | ORAL | Status: DC
  Administered 2012-08-08 – 2012-08-12 (×7): 1 via ORAL
  Administered 2012-08-14: 0.9875 via ORAL
  Administered 2012-08-15 – 2012-08-16 (×3): 1 via ORAL

## 2012-08-08 MED ORDER — HALOPERIDOL LACTATE 5 MG/ML IJ SOLN
5.0000 mg | Freq: Two times a day (BID) | INTRAMUSCULAR | Status: DC
  Administered 2012-08-08 – 2012-08-09 (×2): 5 mg via INTRAVENOUS
  Filled 2012-08-08 (×2): qty 1

## 2012-08-08 NOTE — Progress Notes (Signed)
Name: Martin Chapman MRN: [id number] DOB: 1991/07/19    LOS: 17  PULMONARY / CRITICAL CARE MEDICINE  HPI:  21 years old male with PMH relevant for heroin addiction, depression, anxiety and panic attacks. He overdosed on heroin and alcohol 10/19 PM  And 10/20 PM he was found unresponsive at about 2:00 pm. Patient was on vent but extubated 10/27, s/p compartment syndrome and release in OR of R shoulder muscles; Currently with renal failure on HD; Now with C. Diff colitis; Prolonged delirium of 14 days starting to improve   Events since admission: 10/20- Admitted to ICU 10/20 - Surgery with shoulder release and fasciotomy of right arm 10/21 - Started on CVVH>>d/c 10/22 10/22 - Return to OR for I&D with wound vac placement. Bicep excised  10/25 - Vac change 10/26 - diarrhea>>C diff positive 10/27 - extubated 10/27 - agitation, precedex started 10/29 - HD cath placement 11/1 - MRI shows no acute changes, premature cerebral atrophy 11/3 - LP performed  Current Status: markedly improved mentation and behavior in last 24 hours, depakote given , less precedex; repeatedly febrile; increased pain in RUE  Vital Signs: Temp:  [97.8 F (36.6 C)-101.2 F (38.4 C)] 100.5 F (38.1 C) (11/06 0700) Pulse Rate:  [66-116] 105  (11/06 0745) Resp:  [7-24] 15  (11/06 0745) BP: (120-157)/(71-100) 147/81 mmHg (11/06 0745) SpO2:  [93 %-100 %] 97 % (11/06 0745) Weight:  [166 lb 10.7 oz (75.6 kg)-172 lb 2.9 oz (78.1 kg)] 172 lb 2.9 oz (78.1 kg) (11/06 0700)  Intake/Output Summary (Last 24 hours) at 08/08/12 0754 Last data filed at 08/08/12 0600  Gross per 24 hour  Intake 414.05 ml  Output      1 ml  Net 413.05 ml    Physical Examination: General - fatigued but awakes to stimulation, alert and oriented x 2 (does know location or date), marked improvement HEENT - NG in place Cardiac - s1s2 regular Chest - Tunneled HD catheter on left; CTAB Abd - soft, bs wnl , no r Ext - Rt upper arm  wrapped in ACE, wound vac removed, 10 mc well healing surgical incision with good granulation tissue generalized edema of RUE; Good pulses all extremities; LUE without swelling  Neuro - improved mentation and behavior, responds appropriately to questions  No results found.  ASSESSMENT AND PLAN  PULMONARY No results found for this basename: PHART:5,PCO2:5,PCO2ART:5,PO2ART:5,HCO3:5,O2SAT:5 in the last 168 hours Ventilator Settings: n/a  A:   1) Acute hypoxic respiratory failure likely secondary to aspiration pneumonia / pneumonitis.  Extubated 10/27 with agitation  P:   Aspiration precautions Fever- pcxr  CARDIOVASCULAR No results found for this basename: TROPONINI:5,LATICACIDVEN:5, O2SATVEN:5,PROBNP:5 in the last 168 hours  Lines:  Left IJ placed 07/22/12 >> R femoral HD cath 10/20>>10/30 Art line L radial 10/21>>10/24 Left tunneled IJ HD cath 10/29>>  Echo 10/22>>EF 55 to 60%  A:  1) Hypovolemic/septic shock>>off pressors 10/24. 2) Lactic acidosis likely secondary to shock and rhabdomyolysis>>resolved 3) Elevated troponin P:  precedex remains off Consider dc tele  RENAL  Lab 08/06/12 0500 08/04/12 0448 08/03/12 0459 08/02/12 0500  NA 138 139 140 139  K 5.2* 4.1 -- --  CL 100 100 102 102  CO2 20 23 26 28   BUN 74* 32* 41* 27*  CREATININE 14.61* 8.44* 11.04* 8.05*  CALCIUM 8.7 8.5 8.4 8.2*  MG -- -- -- --  PHOS 7.9* 5.4* 6.8* 3.0   Intake/Output      11/05 0701 - 11/06 0700 11/06 0701 -  11/07 0700   P.O.     I.V. (mL/kg) 304.1 (3.9)    NG/GT 110    IV Piggyback     Total Intake(mL/kg) 414.1 (5.3)    Urine (mL/kg/hr)     Emesis/NG output 1    Other     Stool     Total Output 1    Net +413.1         Stool Occurrence 2 x     Foley:  07/22/12  A:   1) Acute renal failure s/p CRRT>>transitioned to intermittent HD 2) Rhabdomyolysis (resolved) P:   HD per renal in am  Perm cath placement 10/29  GASTROINTESTINAL  Lab 08/06/12 0500 08/04/12 0448  08/03/12 0459 08/02/12 0500  AST 32 -- -- 83*  ALT 12 -- -- 39  ALKPHOS 81 -- -- 77  BILITOT 0.4 -- -- 0.3  PROT 5.8* -- -- 5.2*  ALBUMIN 2.8* 2.6* 2.5* 2.4*    A:   1) Elevated LFT's and INR likely shock liver (improving) 2) Nutrition 3) Diarrhea 10/26 from C diff P:   renal diet Stop date PO vanc  HEMATOLOGIC  Lab 08/06/12 0500 08/04/12 1315 08/04/12 0448 08/03/12 0459 08/02/12 0500  HGB 7.5* -- 7.6* 7.3* 7.4*  HCT 22.4* -- 22.9* 21.8* 21.8*  PLT 467* -- 427* 381 349  INR -- 1.24 -- -- --  APTT -- -- -- -- --   A:   1) Elevated INR likely from shock liver>>resolved 2) Anemia of critical illness, dilutional  3) Thrombocytopenia>>Thrombocytosis P:  Avoid phlebotomy Epo and aranesp per renal Transfuse for Hb < 7, limit blood draws  INFECTIOUS  Lab 08/06/12 0500 08/04/12 0448 08/03/12 0459 08/02/12 0500  WBC 13.1* 13.3* 12.7* 13.1*  PROCALCITON -- -- -- --   Cultures: Blood culture 10/20>>ng Urine culture 10/20>> negative BCx 10/23>> NGTD UCX 10/23 >>negative C diff 10/26>>Positive BCx 11/1>> LP 11/3>> negative HSV in CSF, negative fungal cx HIV neg RPR neg   Antibiotics: Zosyn 10/20>>10/28 IV Vancomycin 10/20>>10/26 PO vancomycin 10/26>>  A:   1) Aspiration pneumonia. 2) Rt arm compartment syndrome 3) Recurrent fever>>10/23 4) Diarrhea 10/26 from C diff 5) Recurrent fever intermittent, LIne? P:   Continue po vancomycin, establish stop date for 14 days Consider repeat blood cx for recurrent fever pcxr KUB UA if urine noted Follow wound evaluation May need empiric vanc, ceftaz  ENDOCRINE  Lab 08/06/12 2027 08/06/12 1608 08/06/12 1118 08/06/12 0821 08/06/12 0416  GLUCAP 97 100* 87 94 87   A:   1) Hyperglycemia>>improved; no Hx of DM P:   POCT glucose TID  NEUROLOGIC EEG: mild encephalopathy MRI brain 11/1: negative qtc- 442 11/4 - B12, Thiamine, ammonia, TSH - WNL HIV- neg rpr neg A:   1) Acute encephalopathy  - favor metabolic,  doubt substance withdrawal at this juncture. MR brain reassuring At risk W-K syndrome Baseline depresion, anxiety MAJOR IMPROVED WITH DEPAKOTE  P:   - continue thiamine, consider high dose IV, today 200, reduce to oral -methadone, remain - stable on haldol q6, consider reducing to q12 -Tolerating 0.1 clonidine q12h, remain, dc over 1 week -goal reduction ativan in future, over next 4 days - Continue Depakote, Dep level in 2 days, he would benefit from continued use, and outpt psych eval for duration of use -continue prn versed - ECG daily for qtc and continous - 450 today 11/6  MSK: A:  Right upper extremity injury with compartment syndrome and possible vascular injury Note arch aortogram in  OR normal.  No vascular deficit in RUE/shoulde P: Care for RUE per Dr. Lajoyce Corners and wound care - no wound vac, continue Silvadene dressing, pain management with methadone and PRN norco   GLOBAL -Have made progress with precedex reduction and use of antipsychotics; likely appropriate for moving to the floor; patient still dealing with renal failure on HD, recurrent fevers, and C. Diff colitis depakote was likley the rx that improved him  BEST PRACTICE / DISPOSITION - Level of Care:  ICU to floor - Primary Service:  PCCM- to triad - Consultants:  Vascular surgery, orthopedic surgery, nephrology - Code Status:  Full code - Diet:  passedd swallow - DVT Px:  SCD, Hep SQ - GI Px:  Protonix - Skin Integrity:  Intact - Social / Family:  No family at bedside   [employer]. Clinton Sawyer, MD, MBA 08/08/2012, 7:54 AM Family Medicine Resident, PGY-2 [phone] pager   Mat Carne  I have fully examined this patient and agree with above findings.    And edited in full; history obtain ed personally.  Mcarthur Rossetti. Tyson Alias, MD, FACP Pgr: [phone] [facility]

## 2012-08-08 NOTE — Progress Notes (Signed)
08/08/12 1600  Clinical Encounter Type  Visited With Health care provider  Visit Type Follow-up   I spoke to the head nurse to get an update on the patient. Nurse said the patient will be moved from [facility]-ICU to a regular room on 08/09/12. His condition has improved. No family members were present. Veryl Speak

## 2012-08-08 NOTE — Progress Notes (Signed)
Occupational Therapy Treatment Patient Details Name: Martin Chapman MRN: [id number] DOB: 26-Feb-1991 Today's Date: 08/08/2012 Time: 4098-1191 OT Time Calculation (min): 32 min  OT Assessment / Plan / Recommendation Comments on Treatment Session Pt. improving.  Demonstrates improved AROM and AAROM Rt. UE.  Pt. with increasing functional use of Rt. UE, but still will initiate all activities with Lt. although he is Rt. handed.      Follow Up Recommendations  CIR;Supervision/Assistance - 24 hour    Barriers to Discharge       Equipment Recommendations  Tub/shower seat    Recommendations for Other Services    Frequency Min 2X/week   Plan Discharge plan remains appropriate    Precautions / Restrictions Precautions Precautions: Fall Restrictions Weight Bearing Restrictions: No   Pertinent Vitals/Pain     ADL  Lower Body Dressing: Moderate assistance (min A for socks) Where Assessed - Lower Body Dressing: Supported sit to stand Toilet Transfer: Minimal assistance;Moderate assistance Toilet Transfer Method: Sit to stand;Stand pivot (mod A ambulation) Toilet Transfer Equipment: Comfort height toilet Transfers/Ambulation Related to ADLs: min A for transfer, mod A for ambulation ADL Comments: Pt. able to don socks with min A while EOB    OT Diagnosis:    OT Problem List:   OT Treatment Interventions:     OT Goals ADL Goals ADL Goal: Lower Body Dressing - Progress: Progressing toward goals ADL Goal: Toilet Transfer - Progress: Progressing toward goals ADL Goal: Additional Goal #1 - Progress: Progressing toward goals  Visit Information  Last OT Received On: 08/08/12 Assistance Needed: +1    Subjective Data      Prior Functioning       Cognition  Overall Cognitive Status: Impaired Area of Impairment: Attention;Memory;Safety/judgement;Awareness of deficits;Problem solving Arousal/Alertness: Lethargic Orientation Level: Disoriented to;Time;Situation (knows why he's  here, but not what happened to his arm) Behavior During Session: Flat affect Current Attention Level: Selective Memory Deficits: Decreased recall of how to use phone to call father.  Unable to recall what happened to his Rt. UE Safety/Judgement: Decreased safety judgement for tasks assessed;Impulsive;Decreased awareness of need for assistance Problem Solving: max A for novel tasks    Mobility  Shoulder Instructions Bed Mobility Bed Mobility: Supine to Sit;Sitting - Scoot to Edge of Bed;Sit to Supine Supine to Sit: 4: Min assist Sitting - Scoot to Edge of Bed: 4: Min assist Sit to Supine: 4: Min guard Details for Bed Mobility Assistance: Cues for safety and sequencing.  Pt. impulsive Transfers Transfers: Sit to Stand;Stand to Sit Sit to Stand: 4: Min assist;From bed;From chair/3-in-1 Stand to Sit: 4: Min assist;To bed;To chair/3-in-1 Details for Transfer Assistance: Pt. with posterior bias.  requires physical asist to right posture       Exercises  General Exercises - Upper Extremity Shoulder Flexion: AAROM;AROM;Right;10 reps;Supine (10 reps AROM to 90; 10 reps AAROM full ROM) Shoulder Extension: AAROM;AROM;10 reps;Right;Supine Shoulder Horizontal ABduction: AAROM;Right;10 reps;Supine Shoulder Horizontal ADduction: AAROM;Right;10 reps;Supine Elbow Flexion: AAROM;Right;10 reps;Supine Elbow Extension: AAROM;Right;Seated (c/o significant pain with end range extension) Digit Composite Flexion: AROM;Right;10 reps;Supine Composite Extension: AROM;10 reps;Supine Hand Exercises Forearm Supination: AAROM;10 reps;Right;Supine Forearm Pronation: AAROM;Right;15 reps;Supine Other Exercises Other Exercises: Pt. spontaneously attempted to use Rt. UE today as a gross assist while donning socks   Balance Balance Balance Assessed: Yes Dynamic Sitting Balance Dynamic Sitting - Balance Support: No upper extremity supported Dynamic Sitting - Level of Assistance: 5: Stand by assistance Dynamic  Sitting - Balance Activities: Other (comment) (donning/doffing socks) Static Standing Balance Static  Standing - Balance Support: Bilateral upper extremity supported Static Standing - Level of Assistance: 3: Mod assist   End of Session OT - End of Session Activity Tolerance: Patient limited by fatigue Patient left: in chair;with call bell/phone within reach Nurse Communication: Mobility status  GO     Safir Michalec, Ursula Alert M 08/08/2012, 12:43 PM

## 2012-08-08 NOTE — Progress Notes (Signed)
Nutrition Follow-up  Intervention:    Change diet to Regular to help improve oral intake.  Resource Breeze PO TID to maximize oral intake.  Assessment:   Discussed patient in rounds this morning.  Wound VAC was removed.  Patient continues to receive HD.  Diet has been advanced to Renal diet and patient does not like it.  Has been eating very poorly per RN.  Only ate a few bites of his lunch today.  Family is bringing in a milkshake from [employer] today.  Okay to liberalize diet per discussion with CCM.  Patient with increased needs for HD.  Diet Order: Renal 60/70-2-2 with 1200 ml fluid restriction  Meds: Scheduled Meds:    . Chlorhexidine Gluconate Cloth  6 each Topical Q0600  . cloNIDine  0.1 mg Oral QHS  . darbepoetin (ARANESP) injection - DIALYSIS  150 mcg Intravenous Q Sat-HD  . divalproex  250 mg Oral Q12H  . famotidine  20 mg Oral Daily  . ferric gluconate (FERRLECIT/NULECIT) IV  125 mg Intravenous Q M,W,F-HD  . haloperidol lactate  5 mg Intravenous q12n4p  . heparin subcutaneous  5,000 Units Subcutaneous Q8H  . LORazepam  2 mg Oral TID  . methadone  5 mg Oral Q8H  . multivitamin  1 tablet Oral QHS  . saccharomyces boulardii  250 mg Oral BID  . silver sulfADIAZINE   Topical Daily  . thiamine  100 mg Oral Daily  . vancomycin  500 mg Per Tube Q6H  . [DISCONTINUED] cloNIDine  0.1 mg Oral QHS  . [DISCONTINUED] haloperidol lactate  5 mg Intravenous Q8H  . [DISCONTINUED] thiamine  200 mg Intravenous Daily   Continuous Infusions:    . sodium chloride 20 mL/hr at 08/05/12 0645  . dextrose 5 % and 0.9% NaCl 10 mL/hr at 07/29/12 0914  . [DISCONTINUED] dexmedetomidine Stopped (08/07/12 1100)  . [DISCONTINUED] feeding supplement (NEPRO CARB STEADY) 1,000 mL (08/04/12 0244)   PRN Meds:.sodium chloride, sodium chloride, acetaminophen (TYLENOL) oral liquid 160 mg/5 mL, feeding supplement (NEPRO CARB STEADY), heparin, heparin, heparin, heparin, heparin, heparin, heparin, heparin,  heparin, heparin, heparin, lidocaine, lidocaine, lidocaine-prilocaine, lidocaine-prilocaine, ondansetron, oxyCODONE, pentafluoroprop-tetrafluoroeth, pentafluoroprop-tetrafluoroeth, [DISCONTINUED] feeding supplement (NEPRO CARB STEADY) [DISCONTINUED] LORazepam, [DISCONTINUED]  morphine injection  Labs:  CMP     Component Value Date/Time   NA 135 08/08/2012 0904   K 4.3 08/08/2012 0904   CL 93* 08/08/2012 0904   CO2 23 08/08/2012 0904   GLUCOSE 88 08/08/2012 0904   BUN 57* 08/08/2012 0904   CREATININE 12.01* 08/08/2012 0904   CALCIUM 8.4 08/08/2012 0904   PROT 5.8* 08/06/2012 0500   ALBUMIN 2.7* 08/08/2012 0904   AST 32 08/06/2012 0500   ALT 12 08/06/2012 0500   ALKPHOS 81 08/06/2012 0500   BILITOT 0.4 08/06/2012 0500   GFRNONAA 5* 08/08/2012 0904   GFRAA 6* 08/08/2012 0904    Sodium  Date/Time Value Range Status  08/08/2012  9:04 AM 135  135 - 145 mEq/L Final  08/06/2012  5:00 AM 138  135 - 145 mEq/L Final  08/04/2012  4:48 AM 139  135 - 145 mEq/L Final    Potassium  Date/Time Value Range Status  08/08/2012  9:04 AM 4.3  3.5 - 5.1 mEq/L Final  08/06/2012  5:00 AM 5.2* 3.5 - 5.1 mEq/L Final  08/04/2012  4:48 AM 4.1  3.5 - 5.1 mEq/L Final    Phosphorus  Date/Time Value Range Status  08/08/2012  9:04 AM 8.2* 2.3 - 4.6 mg/dL Final  08/11/1477  5:00 AM 7.9* 2.3 - 4.6 mg/dL Final  16/10/958  4:54 AM 5.4* 2.3 - 4.6 mg/dL Final    Magnesium  Date/Time Value Range Status  07/31/2012  5:00 AM 2.3  1.5 - 2.5 mg/dL Final  09/81/1914  7:82 AM 2.5  1.5 - 2.5 mg/dL Final  95/62/1308  6:57 AM 2.7* 1.5 - 2.5 mg/dL Final    CBG (last 3)   Basename 08/06/12 2027 08/06/12 1608 08/06/12 1118  GLUCAP 97 100* 87     Intake/Output Summary (Last 24 hours) at 08/08/12 1502 Last data filed at 08/08/12 1200  Gross per 24 hour  Intake    450 ml  Output   4101 ml  Net  -3651 ml    Weight Status:  78.1 kg down from 81.2 kg (11/1) with negative fluid status  Estimated needs:  2350-2700 kcals, 95-115 gm  protein, ~1.2 liters fluid daily  Nutrition Dx:  Inadequate oral intake now r/t dislike of renal diet AEB refusal of meals, ongoing.  Goal:  Intake to meet >90% of estimated nutrition needs, unmet.  Monitor:  PO intake, weight trend, labs, I/O.   Joaquin Courts, RD, LDN, CNSC Pager# [phone] After Hours Pager# [phone]

## 2012-08-08 NOTE — Consult Note (Signed)
Wound care follow-up:  Dr Lajoyce Corners in yesterday to assess right arm.  Vac has been d/ced and Dr Lajoyce Corners following for assessment and plan of care. Will not plan to follow further unless re-consulted.  [address], RN, MSN, [employer]  [phone]

## 2012-08-08 NOTE — Progress Notes (Signed)
Attempted to speak with patient in his room but patient was sleeping and would arouse only momentarily before resuming sleep.  CSW spoke with patient's nurse, case manager and PT staff member who had previously worked with patient.  Hopefully patient will be candidate for CIR.  Will continue to follow.   Carney Bern, LCSWA Clinical Social Worker [phone]

## 2012-08-08 NOTE — Consult Note (Signed)
Physical Medicine and Rehabilitation Consult Reason for Consult: Encephalopathy. Referring Physician: Dr. Tyson Alias   HPI: Martin Chapman is a 21 y.o. male with a past history of heroin addiction, depression, anxiety and panic attacks. PAtient was brought to [facility] on 07/22/12 due to friends finding him unresponsive (around 2PM) overdosed on heroin and alcohol the night before. He responded to narcan but was hypotensive with SBP 40-80 on levophed.  He was noted to have ischemic RUE with right arm compartment syndrome and right periclavicular hematoma, rhabdomyolysis with profound metabolic acidosis/hyperkalemia with elevated lactate due to acute renal failure. He was taken to OR for arch aortogram and placement of L-femoral dialysis catheter by Dr. Darrick Penna and RUE fasciotomy by Dr. Lajoyce Corners. He required I and D with excision of biceps muscle (complete nonviable necrotic muscle) and neurolysis of median nerve with VAC placement on 10/22. Intubated and sedated but has had problems with agitation. Treated with IV antibiotics for aspiration PNA and right arm wound.   On HD for renal failure and permanent cath placed on 10/28.  Did develop C-diff colitis treated with enteral vancomycin. Confusion and agitation treated with precedex.  Extubated on 10/28 and on suicide precautions.  Neurology consulted for input on confusion. MRI brain done 11/01 showing no signs of hypoxic/ ischemic encephalopathy and no acute/subacute infarct. EEG with generalized slowing with evidence of nonspecific encephalopathy. LP done 11/3 and shows no growth. Neurology feels that's patient's persistent confusion/agitation is due to delirium. Started on Depakote for delirium.  Lethargy improving and patient showing improved participation in therapy. OT/MD recommending CIR.  Review of Systems  Unable to perform ROS: mental acuity   Past Medical History  Diagnosis Date  . Anxiety   . Panic attacks   . Depression    Past Surgical  History  Procedure Date  . Extraction of wisdom teeth   . Intraoperative arteriogram 07/22/2012    Procedure: INTRA OPERATIVE ARTERIOGRAM;  Surgeon: Sherren Kerns, MD;  Location: [facility] OR;  Service: Vascular;  Laterality: Left;  arch aortagram, second order catheterization right subclavian artery, ultrasound left groin  . Insertion of dialysis catheter 07/22/2012    Procedure: INSERTION OF DIALYSIS CATHETER;  Surgeon: Sherren Kerns, MD;  Location: [facility] OR;  Service: Vascular;  Laterality: Right;  . Fasciotomy 07/22/2012    Procedure: FASCIOTOMY;  Surgeon: Nadara Mustard, MD;  Location: [facility] OR;  Service: Orthopedics;  Laterality: Right;  . I&d extremity 07/24/2012    Procedure: IRRIGATION AND DEBRIDEMENT EXTREMITY;  Surgeon: Nadara Mustard, MD;  Location: [facility] OR;  Service: Orthopedics;  Laterality: Right;  Right Arm Irrigation and Debridement, VAC Application  . Application of wound vac 07/24/2012    Procedure: APPLICATION OF WOUND VAC;  Surgeon: Nadara Mustard, MD;  Location: [facility] OR;  Service: Orthopedics;  Laterality: Right;  . Insertion of dialysis catheter 07/31/2012    Procedure: INSERTION OF DIALYSIS CATHETER;  Surgeon: Nada Libman, MD;  Location: [facility] OR;  Service: Vascular;  Laterality: Left;  internal jugular   Family History  Problem Relation Age of Onset  . Hypertension Father    Social History: Lives with mother? Was in drug rehab recently? He reports that he has been smoking [id number].  He has been smoking about 1 pack per day. He does not have any smokeless tobacco history on file. He reports that he drinks alcohol. He reports that he uses illicit drugs a bundle of Cocaine.  Allergies: No Known Allergies No prescriptions prior to admission  Home: Home Living Lives With: Other (Comment);Family (mother) Available Help at Discharge:  (No family available) Additional Comments: Pt unable to answer questions correctly  Functional History: Prior Function Able to Take  Stairs?: Yes Driving: Yes Vocation: Student Functional Status:  Mobility: Bed Mobility Bed Mobility: Supine to Sit;Sitting - Scoot to Edge of Bed;Sit to Supine Supine to Sit: 4: Min assist Sitting - Scoot to Edge of Bed: 4: Min assist Sit to Supine: 4: Min guard Transfers Transfers: Sit to Stand;Stand to Sit Sit to Stand: 4: Min assist;From bed;From chair/3-in-1 Sit to Stand: Patient Percentage: 70% Stand to Sit: 4: Min assist;To bed;To chair/3-in-1 Stand to Sit: Patient Percentage: 70% Ambulation/Gait Ambulation/Gait Assistance: 1: +2 Total assist Ambulation/Gait: Patient Percentage: 70% Ambulation Distance (Feet): 80 Feet Assistive device: 2 person hand held assist Ambulation/Gait Assistance Details: progressively heavier tremors, uncoordinated gait, but much improved over 11/4 Gait Pattern: Step-through pattern;Decreased step length - right;Decreased step length - left;Decreased stride length (limp on R) Stairs: No Wheelchair Mobility Wheelchair Mobility: No  ADL: ADL Eating/Feeding: Set up Where Assessed - Eating/Feeding: Bed level Grooming: Wash/dry hands;Wash/dry face;Teeth care;Moderate assistance Where Assessed - Grooming: Unsupported sitting Upper Body Bathing: Maximal assistance Where Assessed - Upper Body Bathing: Unsupported sitting Lower Body Bathing: Maximal assistance Where Assessed - Lower Body Bathing: Supported sit to stand Upper Body Dressing: +1 Total assistance Where Assessed - Upper Body Dressing: Unsupported sitting Lower Body Dressing: Moderate assistance (min A for socks) Where Assessed - Lower Body Dressing: Supported sit to stand Toilet Transfer: Minimal assistance;Moderate assistance Toilet Transfer Method: Sit to stand;Stand pivot (mod A ambulation) Toilet Transfer Equipment: Comfort height toilet Transfers/Ambulation Related to ADLs: min A for transfer, mod A for ambulation ADL Comments: Pt. able to don socks with min A while  EOB  Cognition: Cognition Arousal/Alertness: Lethargic Orientation Level: Oriented to person;Oriented to place;Oriented to time;Oriented to situation Cognition Overall Cognitive Status: Impaired Area of Impairment: Attention;Memory;Safety/judgement;Awareness of deficits;Problem solving Arousal/Alertness: Lethargic Orientation Level: Disoriented to;Time;Situation (knows why he's here, but not what happened to his arm) Behavior During Session: Flat affect Current Attention Level: Selective Memory Deficits: Decreased recall of how to use phone to call father.  Unable to recall what happened to his Rt. UE Safety/Judgement: Decreased safety judgement for tasks assessed;Impulsive;Decreased awareness of need for assistance Problem Solving: max A for novel tasks  Blood pressure 138/89, pulse 140, temperature 99.5 F (37.5 C), temperature source Oral, resp. rate 17, height 5\' 7"  (1.702 m), weight 78.1 kg (172 lb 2.9 oz), SpO2 97.00%. Physical Exam  Nursing note and vitals reviewed. Constitutional: He appears well-developed and well-nourished. He appears lethargic.  HENT:  Head: Normocephalic and atraumatic.  Neck: Normal range of motion. Neck supple.  Cardiovascular: Normal rate and regular rhythm.   Pulmonary/Chest: Effort normal and breath sounds normal.  Abdominal: Soft. Bowel sounds are normal.  Neurological: He appears lethargic.       Oriented to self and place "hospital". Confused and lethargic.   Difficulty keeping eyes open and to engage in conversation.  Able to move BLE and LUE without difficulty.   Psychiatric: His affect is blunt. His speech is delayed and tangential. He is slowed and withdrawn. He expresses inappropriate judgment. He exhibits abnormal recent memory.       Recently received Haldol He is inattentive.  4/5 in the right deltoid, biceps, triceps, grip 4+ in bilateral hip flexors knee extensors ankle dorsiflexors and plantar flexors. Left upper extremity not tested due  to dialysis in progress Sensation intact  to light touch in bilateral upper and lower extremities. No ataxia on finger nose to finger testing on the right upper extremity Cranial nerves are intact  Results for orders placed during the hospital encounter of 07/22/12 (from the past 24 hour(s))  CBC     Status: Abnormal   Collection Time   08/08/12  9:04 AM      Component Value Range   WBC 9.9  4.0 - 10.5 K/uL   RBC 2.43 (*) 4.22 - 5.81 MIL/uL   Hemoglobin 7.0 (*) 13.0 - 17.0 g/dL   HCT 16.1 (*) 09.6 - 04.5 %   MCV 85.2  78.0 - 100.0 fL   MCH 28.8  26.0 - 34.0 pg   MCHC 33.8  30.0 - 36.0 g/dL   RDW 40.9  81.1 - 91.4 %   Platelets 497 (*) 150 - 400 K/uL  RENAL FUNCTION PANEL     Status: Abnormal   Collection Time   08/08/12  9:04 AM      Component Value Range   Sodium 135  135 - 145 mEq/L   Potassium 4.3  3.5 - 5.1 mEq/L   Chloride 93 (*) 96 - 112 mEq/L   CO2 23  19 - 32 mEq/L   Glucose, Bld 88  70 - 99 mg/dL   BUN 57 (*) 6 - 23 mg/dL   Creatinine, Ser 78.29 (*) 0.50 - 1.35 mg/dL   Calcium 8.4  8.4 - 56.2 mg/dL   Phosphorus 8.2 (*) 2.3 - 4.6 mg/dL   Albumin 2.7 (*) 3.5 - 5.2 g/dL   GFR calc non Af Amer 5 (*) >90 mL/min   GFR calc Af Amer 6 (*) >90 mL/min   Dg Chest Port 1 View  08/08/2012  *RADIOLOGY REPORT*  Clinical Data: Fever  PORTABLE CHEST - 1 VIEW  Comparison: 07/31/2012  Findings: Left large bore catheter tips project over the mid/distal SVC.  Heart size upper normal to mildly enlarged.  Mild central vascular congestion. Mild peribronchial thickening. The above findings may be accentuated by hypoaeration. No confluent airspace opacity.  No pleural effusion or pneumothorax.  No acute osseous finding.  IMPRESSION: Mild peribronchial thickening is nonspecific, can be seen with bronchitis or mild edema.  No confluent airspace opacity.   Original Report Authenticated By: Jearld Lesch, M.D.     Assessment/Plan: Diagnosis: Deconditioning after multisystem failure related to  narcotic and alcohol overdose. 1. Does the need for close, 24 hr/day medical supervision in concert with the patient's rehab needs make it unreasonable for this patient to be served in a less intensive setting? Potentially 2. Co-Morbidities requiring supervision/potential complications: Delirium, drug addiction, acute renal failure, shock liver, anemia 3. Due to bladder management, bowel management, safety, skin/wound care, disease management, medication administration, pain management and patient education, does the patient require 24 hr/day rehab nursing? Potentially 4. Does the patient require coordinated care of a physician, rehab nurse, PT (1-2 hrs/day, 5 days/week), OT (1-2 hrs/day, 5 days/week) and SLP (0.5-1 hrs/day, 5 days/week) to address physical and functional deficits in the context of the above medical diagnosis(es)? Potentially Addressing deficits in the following areas: balance, endurance, locomotion, strength, transferring, bowel/bladder control, bathing, dressing, feeding, grooming, toileting and cognition 5. Can the patient actively participate in an intensive therapy program of at least 3 hrs of therapy per day at least 5 days per week? No 6. The potential for patient to make measurable gains while on inpatient rehab is fair 7. Anticipated functional outcomes upon discharge from inpatient  rehab are Supervision mobility with PT, Supervision to mod I ADLs with OT, 100% orientation, safety awareness with SLP. 8. Estimated rehab length of stay to reach the above functional goals is: 2 weeks 9. Does the patient have adequate social supports to accommodate these discharge functional goals? No 10. Anticipated D/C setting: Unknown 11. Anticipated post D/C treatments: Outpt therapy 12. Overall Rehab/Functional Prognosis: fair  RECOMMENDATIONS: This patient's condition is appropriate for continued rehabilitative care in the following setting: Currently lacks the endurance to participate in  rehabilitation program. Also has no discharge disposition Patient has agreed to participate in recommended program. Potentially Note that insurance prior authorization may be required for reimbursement for recommended care.  Comment:Would recommend psychiatric consult. Will need addiction medicine followup and  an inpatient program    08/08/2012

## 2012-08-09 LAB — CULTURE, BLOOD (ROUTINE X 2): Culture: NO GROWTH

## 2012-08-09 LAB — CSF CULTURE W GRAM STAIN: Culture: NO GROWTH

## 2012-08-09 MED ORDER — METHADONE HCL 10 MG PO TABS: 5.0000 mg | ORAL_TABLET | Freq: Two times a day (BID) | ORAL | Status: DC

## 2012-08-09 MED ORDER — HALOPERIDOL LACTATE 5 MG/ML IJ SOLN
5.0000 mg | Freq: Two times a day (BID) | INTRAMUSCULAR | Status: DC | PRN
  Administered 2012-08-10 – 2012-08-13 (×3): 5 mg via INTRAVENOUS
  Filled 2012-08-09 (×6): qty 1

## 2012-08-09 MED ORDER — LORAZEPAM 1 MG PO TABS
2.0000 mg | ORAL_TABLET | Freq: Two times a day (BID) | ORAL | Status: DC
  Administered 2012-08-09 – 2012-08-20 (×23): 2 mg via ORAL
  Filled 2012-08-09: qty 1
  Filled 2012-08-09 (×3): qty 2
  Filled 2012-08-09 (×2): qty 1
  Filled 2012-08-09 (×2): qty 2
  Filled 2012-08-09: qty 1
  Filled 2012-08-09 (×8): qty 2
  Filled 2012-08-09: qty 1
  Filled 2012-08-09: qty 2
  Filled 2012-08-09: qty 1
  Filled 2012-08-09 (×6): qty 2

## 2012-08-09 MED ORDER — HEPARIN SODIUM (PORCINE) 1000 UNIT/ML DIALYSIS
20.0000 [IU]/kg | INTRAMUSCULAR | Status: DC | PRN
  Administered 2012-08-10: 1600 [IU] via INTRAVENOUS_CENTRAL
  Filled 2012-08-09: qty 2

## 2012-08-09 MED ORDER — METHADONE HCL 5 MG PO TABS
5.0000 mg | ORAL_TABLET | Freq: Two times a day (BID) | ORAL | Status: DC
  Administered 2012-08-09 – 2012-08-10 (×3): 5 mg via ORAL
  Filled 2012-08-09 (×3): qty 1

## 2012-08-09 NOTE — Progress Notes (Signed)
Name: Martin Chapman MRN: [id number] DOB: 1991/02/01    LOS: 18  PULMONARY / CRITICAL CARE MEDICINE  HPI:  21 years old male with PMH relevant for heroin addiction, depression, anxiety and panic attacks. He overdosed on heroin and alcohol 10/19 PM  And 10/20 PM he was found unresponsive at about 2:00 pm. Patient was on vent but extubated 10/27, s/p compartment syndrome and release in OR of R shoulder muscles; Currently with renal failure on HD; Now with C. Diff colitis; Prolonged delirium that has improved   Events since admission: 10/20- Admitted to ICU 10/20 - Surgery with shoulder release and fasciotomy of right arm 10/21 - Started on CVVH>>d/c 10/22 10/22 - Return to OR for I&D with wound vac placement. Bicep excised  10/25 - Vac change 10/26 - diarrhea>>C diff positive 10/27 - extubated 10/27 - agitation, precedex started 10/29 - HD cath placement 11/1 - MRI shows no acute changes, premature cerebral atrophy 11/3 - LP performed 11/5 - Delirium improved   Current Status: markedly improved mentation, depakote given , off precedex, persistent fever, appropriate for transfer   Vital Signs: Temp:  [98.9 F (37.2 C)-100.8 F (38.2 C)] 100.8 F (38.2 C) (11/07 0447) Pulse Rate:  [34-140] 140  (11/06 1217) Resp:  [11-20] 11  (11/06 1300) BP: (120-160)/(69-97) 120/97 mmHg (11/06 1500) SpO2:  [64 %-99 %] 97 % (11/06 1200)  Intake/Output Summary (Last 24 hours) at 08/09/12 0736 Last data filed at 08/08/12 1800  Gross per 24 hour  Intake    600 ml  Output   4701 ml  Net  -4101 ml    Physical Examination: General - fatigued but awakes to stimulation, alert and oriented x 2 (does know location or date), marked improvement HEENT - NG in place Cardiac - s1s2 regular Chest - Tunneled HD catheter on left; CTAB Abd - soft, bs wnl , no r Ext - Rt upper arm wrapped in ACE, wound vac removed, 10 mc well healing surgical incision with good granulation tissue generalized edema  of RUE; Good pulses all extremities; LUE without swelling  Neuro - improved mentation and behavior, responds appropriately to questions  Dg Chest Port 1 View  08/08/2012  *RADIOLOGY REPORT*  Clinical Data: Fever  PORTABLE CHEST - 1 VIEW  Comparison: 07/31/2012  Findings: Left large bore catheter tips project over the mid/distal SVC.  Heart size upper normal to mildly enlarged.  Mild central vascular congestion. Mild peribronchial thickening. The above findings may be accentuated by hypoaeration. No confluent airspace opacity.  No pleural effusion or pneumothorax.  No acute osseous finding.  IMPRESSION: Mild peribronchial thickening is nonspecific, can be seen with bronchitis or mild edema.  No confluent airspace opacity.   Original Report Authenticated By: Jearld Lesch, M.D.     ASSESSMENT AND PLAN  PULMONARY No results found for this basename: PHART:5,PCO2:5,PCO2ART:5,PO2ART:5,HCO3:5,O2SAT:5 in the last 168 hours Ventilator Settings: n/a  A:   1) Acute hypoxic respiratory failure likely secondary to aspiration pneumonia / pneumonitis.  Extubated 10/27 with agitation  P:   Aspiration precautions   CARDIOVASCULAR No results found for this basename: TROPONINI:5,LATICACIDVEN:5, O2SATVEN:5,PROBNP:5 in the last 168 hours  Lines:  Left IJ placed 07/22/12 >>11/1 R femoral HD cath 10/20>>10/30 Art line L radial 10/21>>10/24 Left tunneled IJ HD cath 10/29>>  Echo 10/22>>EF 55 to 60%  A:  1) Hypovolemic/septic shock>>off pressors 10/24. 2) Lactic acidosis likely secondary to shock and rhabdomyolysis>>resolved 3) Elevated troponin P:  precedex remains off Consider dc tele  RENAL  Lab 08/08/12 0904 08/06/12 0500 08/04/12 0448 08/03/12 0459  NA 135 138 139 140  K 4.3 5.2* -- --  CL 93* 100 100 102  CO2 23 20 23 26   BUN 57* 74* 32* 41*  CREATININE 12.01* 14.61* 8.44* 11.04*  CALCIUM 8.4 8.7 8.5 8.4  MG -- -- -- --  PHOS 8.2* 7.9* 5.4* 6.8*   Intake/Output      11/06 0701 -  11/07 0700 11/07 0701 - 11/08 0700   P.O. 600    I.V. (mL/kg)     NG/GT     Total Intake(mL/kg) 600 (7.7)    Urine (mL/kg/hr) 600 (0.3)    Emesis/NG output     Other 4100    Stool 1    Total Output 4701    Net -4101          Foley:  07/22/12  A:   1) Acute renal failure s/p CRRT>>transitioned to intermittent HD 2) Rhabdomyolysis (resolved) P:   HD per renal in am  Perm cath placement 10/29 Needs to be on renal diet  GASTROINTESTINAL  Lab 08/08/12 0904 08/06/12 0500 08/04/12 0448 08/03/12 0459  AST -- 32 -- --  ALT -- 12 -- --  ALKPHOS -- 81 -- --  BILITOT -- 0.4 -- --  PROT -- 5.8* -- --  ALBUMIN 2.7* 2.8* 2.6* 2.5*    A:   1) Elevated LFT's and INR likely shock liver (improving) 2) Nutrition 3) Diarrhea 10/26 from C diff P:   Renal diet   HEMATOLOGIC  Lab 08/08/12 0904 08/06/12 0500 08/04/12 1315 08/04/12 0448 08/03/12 0459  HGB 7.0* 7.5* -- 7.6* 7.3*  HCT 20.7* 22.4* -- 22.9* 21.8*  PLT 497* 467* -- 427* 381  INR -- -- 1.24 -- --  APTT -- -- -- -- --   A:   1) Elevated INR likely from shock liver>>resolved 2) Anemia of critical illness, dilutional  3) Thrombocytopenia>>Thrombocytosis P:  Avoid phlebotomy Epo and aranesp per renal Transfuse for Hb < 7, limit blood draws  INFECTIOUS  Lab 08/08/12 0904 08/06/12 0500 08/04/12 0448 08/03/12 0459  WBC 9.9 13.1* 13.3* 12.7*  PROCALCITON -- -- -- --   Cultures: Blood culture 10/20>>ng Urine culture 10/20>> negative BCx 10/23>> NGTD UCX 10/23 >>negative C diff 10/26>>Positive BCx 11/1>> LP 11/3>> negative HSV in CSF, negative fungal cx HIV neg RPR neg  Blood Cx 11/6>>  Antibiotics: Zosyn 10/20>>10/28 IV Vancomycin 10/20>>10/26 PO vancomycin 10/26>>  A:   1) Aspiration pneumonia. 2) Rt arm compartment syndrome 3) Recurrent fever>>10/23 4) Diarrhea 10/26 from C diff 5) Recurrent fever intermittent, LIne? P:   Continue po vancomycin, establish stop date for 14 days -11/8 fu repeat blood  cx for recurrent fever Follow wound evaluation Hold off empiric abx  ENDOCRINE  Lab 08/06/12 2027 08/06/12 1608 08/06/12 1118 08/06/12 0821 08/06/12 0416  GLUCAP 97 100* 87 94 87   A:   1) Hyperglycemia>>improved; no Hx of DM P:   POCT glucose TID  NEUROLOGIC EEG: mild encephalopathy MRI brain 11/1: negative qtc- 442 11/4 - B12, Thiamine, ammonia, TSH - WNL HIV- neg rpr neg A:   1) Acute encephalopathy  - favor metabolic, doubt substance withdrawal at this juncture. MR brain reassuring At risk W-K syndrome Baseline depresion, anxiety MAJOR IMPROVED WITH DEPAKOTE  P:   - continue thiamine oral -methadone bid - taper to off - haldol q6, reducing to q12 prn -Tolerating 0.1 clonidine q12h, remain, dc over 1 week -goal reduction ativan in  future, over next 4 days - Continue Depakote, Dep level in 2 days, he would benefit from continued use, and  psych eval for duration of use -tele daily for qtc -  MSK: A:  Right upper extremity injury with compartment syndrome and possible vascular injury Note arch aortogram in OR normal.  No vascular deficit in RUE/shoulde P: Care for RUE per Dr. Lajoyce Corners and wound care - no wound vac, continue Silvadene dressing, pain management with methadone and PRN norco   GLOBAL -Have made progress with precedex reduction and use of antipsychotics; move to floor patient still dealing with renal failure on HD, recurrent fevers, and C. Diff colitis depakote was likley the rx that improved him  BEST PRACTICE / DISPOSITION - Level of Care:  ICU to floor - Primary Service:  PCCM- to [facility] - Consultants:  Vascular surgery, orthopedic surgery, nephrology - Code Status:  Full code - Diet:  passedd swallow - DVT Px:  SCD, Hep SQ - GI Px:  Protonix - Skin Integrity:  Intact - Social / Family:  No family at bedside   [employer]. Clinton Sawyer, MD, MBA 08/09/2012, 7:36 AM Family Medicine Resident, PGY-2 [phone] pager  OK to transfer to tele, [facility] to  take over ina m, Rehab consult pending  Swade Shonka V.

## 2012-08-09 NOTE — Progress Notes (Signed)
2000 CSW aware of patient transfer to 2000 unit and evaluation for CIR earlier today.  Higher education careers adviser if needed. Carney Bern, LCSWA Clinical Social Worker [phone]

## 2012-08-09 NOTE — Progress Notes (Signed)
Occupational Therapy Treatment Patient Details Name: Martin Chapman MRN: [id number] DOB: November 23, 1990 Today's Date: 08/09/2012 Time: 2130-8657 OT Time Calculation (min): 26 min  OT Assessment / Plan / Recommendation Comments on Treatment Session Pt continuing to make some progress.  Able to exhibit 80% of full AROM for right elbow flexion and 100% for elbow extension.  Exhibits approximately 80 % of full supination as well.  Still with limited digit flexion with the wrist positioned in neutral, able to perform approximately 75% of full grasp.  Noted decreased flexion in index and middle fingers.  Still needs consistent mod assist for balance with mobility    Follow Up Recommendations  CIR       Equipment Recommendations  Tub/shower seat    Recommendations for Other Services Rehab consult     Plan Discharge plan remains appropriate    Precautions / Restrictions Precautions Precautions: Fall Restrictions Weight Bearing Restrictions: No   Pertinent Vitals/Pain HR increased to 122 BPM, and O2 sats maintained 96% on room air    ADL  Lower Body Dressing: Maximal assistance;Other (comment) (max assist to donn socks using bilateral UEs) Where Assessed - Lower Body Dressing: Unsupported sitting Toilet Transfer: Simulated;Moderate assistance Toilet Transfer Method: Other (comment) (Ambulate without assistive device.) Equipment Used: Gait belt Transfers/Ambulation Related to ADLs: Pt required mod assist for mobility without assistive device.  Frequent LOB to the right when walking and with turning. ADL Comments: Pt attempted to use the LUE as an active assist for donning his gripper sock but needed max assist to perform.  Also needed max facilitation to tie his gown.      OT Goals ADL Goals ADL Goal: Lower Body Dressing - Progress: Progressing toward goals ADL Goal: Toilet Transfer - Progress: Progressing toward goals ADL Goal: Toileting - Clothing Manipulation - Progress:  Progressing toward goals Additional ADL Goal #1: Pt. will demonstrate full AROM Rt. UE as needed for ADLs. ADL Goal: Additional Goal #1 - Progress: Progressing toward goals  Visit Information  Last OT Received On: 08/09/12 Assistance Needed: +1    Subjective Data  Subjective: "I would just get up and go to the bathroom."   (when asked what he would do if he needed to go to the bathroom) Patient Stated Goal: Did not state during the session.      Cognition  Overall Cognitive Status: Impaired Area of Impairment: Attention;Memory;Safety/judgement;Problem solving Arousal/Alertness: Lethargic Orientation Level: Time;Place Behavior During Session: Lethargic Current Attention Level: Sustained Safety/Judgement: Decreased safety judgement for tasks assessed Safety/Judgement - Other Comments: Pt with decreased awreness that he should not get up without assistance.    Mobility  Shoulder Instructions Bed Mobility Bed Mobility: Supine to Sit Supine to Sit: 5: Supervision;HOB flat Transfers Transfers: Sit to Stand Sit to Stand: 4: Min assist;Without upper extremity assist Stand to Sit: 4: Min assist;Without upper extremity assist Details for Transfer Assistance: Pt flopped down to return to sitting.  No use of either UE for sit to stand.          Balance Dynamic Standing Balance Dynamic Standing - Balance Support: No upper extremity supported Dynamic Standing - Level of Assistance: 3: Mod assist   End of Session OT - End of Session Equipment Utilized During Treatment: Gait belt Activity Tolerance: Patient tolerated treatment well Patient left: in bed;with call bell/phone within reach;with bed alarm set Nurse Communication: Mobility status     Kaylen Nghiem OTR/L Pager number [phone] 08/09/2012, 4:16 PM

## 2012-08-09 NOTE — Progress Notes (Signed)
[facility] ROUNDING NOTE   Subjective:   Interval History:Tolerated dialysis 11/6  Objective:  Vital signs in last 24 hours:  Temp:  [98.9 F (37.2 C)-100.8 F (38.2 C)] 100.8 F (38.2 C) (11/07 0447) Pulse Rate:  [74-140] 140  (11/06 1217) Resp:  [11-20] 11  (11/06 1300) BP: (120-150)/(69-97) 120/97 mmHg (11/06 1500) SpO2:  [64 %-99 %] 97 % (11/06 1200)  Weight change:  Filed Weights   08/06/12 1030 08/08/12 0500 08/08/12 0700  Weight: 73.9 kg (162 lb 14.7 oz) 75.6 kg (166 lb 10.7 oz) 78.1 kg (172 lb 2.9 oz)    Intake/Output: I/O last 3 completed shifts: In: 710 [P.O.:600; I.V.:110] Out: 4702 [Urine:600; Emesis/NG output:1; Other:4100; Stool:1]   Intake/Output this shift:     General - fatigued but awakes to stimulation HEENT - NG T  CVS RRR Chest - Tunneled HD catheter on left; Clear Abd - soft, non tender   Ext - Rt upper arm wrapped in ACE, wound vac removed, 10 mc well healing surgical incision with good granulation tissue generalized edema of RUE; Good pulses all extremities; LUE without swelling  Neuro - improved mentation and behavior, responds appropriately to questions    Basic Metabolic Panel:  Lab 08/08/12 1478 08/06/12 0500 08/04/12 0448 08/03/12 0459  NA 135 138 139 140  K 4.3 5.2* 4.1 3.9  CL 93* 100 100 102  CO2 23 20 23 26   GLUCOSE 88 92 109* 119*  BUN 57* 74* 32* 41*  CREATININE 12.01* 14.61* 8.44* 11.04*  CALCIUM 8.4 8.7 8.5 --  MG -- -- -- --  PHOS 8.2* 7.9* 5.4* 6.8*    Liver Function Tests:  Lab 08/08/12 0904 08/06/12 0500 08/04/12 0448 08/03/12 0459  AST -- 32 -- --  ALT -- 12 -- --  ALKPHOS -- 81 -- --  BILITOT -- 0.4 -- --  PROT -- 5.8* -- --  ALBUMIN 2.7* 2.8* 2.6* 2.5*   No results found for this basename: LIPASE:5,AMYLASE:5 in the last 168 hours  Lab 08/02/12 1835  AMMONIA 16    CBC:  Lab 08/08/12 0904 08/06/12 0500 08/04/12 0448 08/03/12 0459  WBC 9.9 13.1* 13.3* 12.7*  NEUTROABS -- 9.6* -- --    HGB 7.0* 7.5* 7.6* 7.3*  HCT 20.7* 22.4* 22.9* 21.8*  MCV 85.2 85.5 87.4 87.2  PLT 497* 467* 427* 381    Cardiac Enzymes: No results found for this basename: CKTOTAL:5,CKMB:5,CKMBINDEX:5,TROPONINI:5 in the last 168 hours  BNP: No components found with this basename: POCBNP:5  CBG:  Lab 08/06/12 2027 08/06/12 1608 08/06/12 1118 08/06/12 0821 08/06/12 0416  GLUCAP 97 100* 87 94 87    Microbiology: Results for orders placed during the hospital encounter of 07/22/12  URINE CULTURE     Status: Normal   Collection Time   07/22/12  4:32 PM      Component Value Range Status Comment   Specimen Description URINE, CATHETERIZED   Final    Special Requests NONE   Final    Culture  Setup Time 07/23/2012 11:13   Final    Colony Count NO GROWTH   Final    Culture NO GROWTH   Final    Report Status 07/24/2012 FINAL   Final   CULTURE, BLOOD (ROUTINE X 2)     Status: Normal   Collection Time   07/22/12  6:42 PM      Component Value Range Status Comment   Specimen Description BLOOD LEFT ARM   Final  Special Requests BOTTLES DRAWN AEROBIC ONLY   Final    Culture  Setup Time 07/23/2012 10:16   Final    Culture NO GROWTH 5 DAYS   Final    Report Status 07/29/2012 FINAL   Final   CULTURE, BLOOD (ROUTINE X 2)     Status: Normal   Collection Time   07/22/12  6:51 PM      Component Value Range Status Comment   Specimen Description BLOOD RIGHT ARM   Final    Special Requests BOTTLES DRAWN AEROBIC ONLY   Final    Culture  Setup Time 07/23/2012 10:16   Final    Culture NO GROWTH 5 DAYS   Final    Report Status 07/29/2012 FINAL   Final   MRSA PCR SCREENING     Status: Normal   Collection Time   07/23/12 12:30 AM      Component Value Range Status Comment   MRSA by PCR NEGATIVE  NEGATIVE Final   CULTURE, BLOOD (ROUTINE X 2)     Status: Normal   Collection Time   07/25/12  8:30 AM      Component Value Range Status Comment   Specimen Description BLOOD LEFT HAND   Final    Special  Requests BOTTLES DRAWN AEROBIC AND ANAEROBIC 10CC   Final    Culture  Setup Time 07/25/2012 13:56   Final    Culture NO GROWTH 5 DAYS   Final    Report Status 07/31/2012 FINAL   Final   CULTURE, BLOOD (ROUTINE X 2)     Status: Normal   Collection Time   07/25/12  8:40 AM      Component Value Range Status Comment   Specimen Description BLOOD RIGHT HAND   Final    Special Requests BOTTLES DRAWN AEROBIC AND ANAEROBIC 10CC   Final    Culture  Setup Time 07/25/2012 13:56   Final    Culture NO GROWTH 5 DAYS   Final    Report Status 07/31/2012 FINAL   Final   URINE CULTURE     Status: Normal   Collection Time   07/25/12  8:49 AM      Component Value Range Status Comment   Specimen Description URINE, CATHETERIZED   Final    Special Requests NONE   Final    Culture  Setup Time 07/25/2012 15:30   Final    Colony Count NO GROWTH   Final    Culture NO GROWTH   Final    Report Status 07/26/2012 FINAL   Final   CLOSTRIDIUM DIFFICILE BY PCR     Status: Abnormal   Collection Time   07/28/12  6:15 AM      Component Value Range Status Comment   C difficile by pcr POSITIVE (*) NEGATIVE Final   CULTURE, BLOOD (ROUTINE X 2)     Status: Normal   Collection Time   08/03/12  2:37 PM      Component Value Range Status Comment   Specimen Description BLOOD RIGHT HAND   Final    Special Requests BOTTLES DRAWN AEROBIC AND ANAEROBIC 10CC   Final    Culture  Setup Time 08/03/2012 21:42   Final    Culture NO GROWTH 5 DAYS   Final    Report Status 08/09/2012 FINAL   Final   CULTURE, BLOOD (ROUTINE X 2)     Status: Normal   Collection Time   08/03/12  3:51 PM  Component Value Range Status Comment   Specimen Description BLOOD ARM LEFT   Final    Special Requests BOTTLES DRAWN AEROBIC ONLY 10CC   Final    Culture  Setup Time 08/03/2012 21:42   Final    Culture NO GROWTH 5 DAYS   Final    Report Status 08/09/2012 FINAL   Final   CSF CULTURE     Status: Normal (Preliminary result)   Collection Time    08/05/12  5:50 PM      Component Value Range Status Comment   Specimen Description CSF   Final    Special Requests NONE   Final    Gram Stain     Final    Value: CYTOSPIN NO WBC SEEN     NO ORGANISMS SEEN   Culture NO GROWTH 2 DAYS   Final    Report Status PENDING   Incomplete   AFB CULTURE WITH SMEAR     Status: Normal (Preliminary result)   Collection Time   08/05/12  5:50 PM      Component Value Range Status Comment   Specimen Description CSF   Final    Special Requests NONE   Final    ACID FAST SMEAR NOT DONE   Final    Culture     Final    Value: CULTURE WILL BE EXAMINED FOR 6 WEEKS BEFORE ISSUING A FINAL REPORT   Report Status PENDING   Incomplete   FUNGUS CULTURE Chapman SMEAR     Status: Normal (Preliminary result)   Collection Time   08/05/12  5:50 PM      Component Value Range Status Comment   Specimen Description CSF   Final    Special Requests NONE   Final    Fungal Smear NO YEAST OR FUNGAL ELEMENTS SEEN   Final    Culture CULTURE IN PROGRESS FOR FOUR WEEKS   Final    Report Status PENDING   Incomplete   CULTURE, BLOOD (ROUTINE X 2)     Status: Normal (Preliminary result)   Collection Time   08/08/12  1:40 PM      Component Value Range Status Comment   Specimen Description BLOOD LEFT HAND   Final    Special Requests BOTTLES DRAWN AEROBIC ONLY 10CC   Final    Culture  Setup Time 08/08/2012 19:01   Final    Culture     Final    Value:        BLOOD CULTURE RECEIVED NO GROWTH TO DATE CULTURE WILL BE HELD FOR 5 DAYS BEFORE ISSUING A FINAL NEGATIVE REPORT   Report Status PENDING   Incomplete   CULTURE, BLOOD (ROUTINE X 2)     Status: Normal (Preliminary result)   Collection Time   08/08/12  1:50 PM      Component Value Range Status Comment   Specimen Description BLOOD RIGHT HAND   Final    Special Requests BOTTLES DRAWN AEROBIC ONLY 10CC   Final    Culture  Setup Time 08/08/2012 19:01   Final    Culture     Final    Value:        BLOOD CULTURE RECEIVED NO GROWTH TO DATE  CULTURE WILL BE HELD FOR 5 DAYS BEFORE ISSUING A FINAL NEGATIVE REPORT   Report Status PENDING   Incomplete     Coagulation Studies: No results found for this basename: LABPROT:5,INR:5 in the last 72 hours  Urinalysis: No results found for this basename:  COLORURINE:2,APPERANCEUR:2,LABSPEC:2,PHURINE:2,GLUCOSEU:2,HGBUR:2,BILIRUBINUR:2,KETONESUR:2,PROTEINUR:2,UROBILINOGEN:2,NITRITE:2,LEUKOCYTESUR:2 in the last 72 hours    Imaging: Dg Chest Port 1 View  08/08/2012  *RADIOLOGY REPORT*  Clinical Data: Fever  PORTABLE CHEST - 1 VIEW  Comparison: 07/31/2012  Findings: Left large bore catheter tips project over the mid/distal SVC.  Heart size upper normal to mildly enlarged.  Mild central vascular congestion. Mild peribronchial thickening. The above findings may be accentuated by hypoaeration. No confluent airspace opacity.  No pleural effusion or pneumothorax.  No acute osseous finding.  IMPRESSION: Mild peribronchial thickening is nonspecific, can be seen with bronchitis or mild edema.  No confluent airspace opacity.   Original Report Authenticated By: Jearld Lesch, M.D.      Medications:      . sodium chloride 20 mL/hr at 08/05/12 0645  . dextrose 5 % and 0.9% NaCl 10 mL/hr at 07/29/12 0914  . [DISCONTINUED] dexmedetomidine Stopped (08/07/12 1100)      . Chlorhexidine Gluconate Cloth  6 each Topical Q0600  . cloNIDine  0.1 mg Oral QHS  . darbepoetin (ARANESP) injection - DIALYSIS  150 mcg Intravenous Q Sat-HD  . divalproex  250 mg Oral Q12H  . famotidine  20 mg Oral Daily  . feeding supplement  1 Container Oral TID BM  . ferric gluconate (FERRLECIT/NULECIT) IV  125 mg Intravenous Q M,Chapman,F-HD  . haloperidol lactate  5 mg Intravenous q12n4p  . heparin subcutaneous  5,000 Units Subcutaneous Q8H  . LORazepam  2 mg Oral TID  . methadone  5 mg Oral Q8H  . multivitamin  1 tablet Oral QHS  . saccharomyces boulardii  250 mg Oral BID  . silver sulfADIAZINE   Topical Daily  . thiamine  100  mg Oral Daily  . vancomycin  500 mg Per Tube Q6H  . [DISCONTINUED] haloperidol lactate  5 mg Intravenous Q8H  . [DISCONTINUED] thiamine  200 mg Intravenous Daily   sodium chloride, sodium chloride, acetaminophen (TYLENOL) oral liquid 160 mg/5 mL, feeding supplement (NEPRO CARB STEADY), heparin, heparin, heparin, heparin, heparin, heparin, heparin, heparin, heparin, heparin, heparin, lidocaine, lidocaine, lidocaine-prilocaine, lidocaine-prilocaine, ondansetron, oxyCODONE, pentafluoroprop-tetrafluoroeth, pentafluoroprop-tetrafluoroeth  Assessment/ Plan:  pt adm with heroin and alcohol overdose, R shd /arm compartment syndrome and resulting rhabdo  10/20- Admitted to ICU  10/20 - Surgery with shoulder release and fasciotomy of right arm  10/21 - Started on CVVH>>d/c 10/22  10/22 - Return to OR for I&D with wound vac placement. Bicep excised  10/25 - Vac change  10/26 - diarrhea>>C diff positive  10/27 - extubated  10/27 - agitation, precedex started  10/29 - HD cath placement  Acute Renal Failure secondary to ATN and Rhabdomyolysis. There appears to be no sign of recovery.Plan dilaysis in AM     LOS: 18 Martin Chapman @TODAY @8 :46 AM

## 2012-08-10 ENCOUNTER — Inpatient Hospital Stay (HOSPITAL_COMMUNITY): Payer: BC Managed Care – PPO

## 2012-08-10 DIAGNOSIS — R5381 Other malaise: Secondary | ICD-10-CM

## 2012-08-10 LAB — CBC
HCT: 23.5 % — ABNORMAL LOW (ref 39.0–52.0)
Hemoglobin: 7.8 g/dL — ABNORMAL LOW (ref 13.0–17.0)
RBC: 2.76 MIL/uL — ABNORMAL LOW (ref 4.22–5.81)
RDW: 13.9 % (ref 11.5–15.5)
WBC: 9.6 10*3/uL (ref 4.0–10.5)

## 2012-08-10 LAB — RENAL FUNCTION PANEL
Albumin: 3.1 g/dL — ABNORMAL LOW (ref 3.5–5.2)
BUN: 39 mg/dL — ABNORMAL HIGH (ref 6–23)
CO2: 25 mEq/L (ref 19–32)
Chloride: 92 mEq/L — ABNORMAL LOW (ref 96–112)
Potassium: 3.8 mEq/L (ref 3.5–5.1)

## 2012-08-10 MED ORDER — FLUCONAZOLE 100 MG PO TABS
100.0000 mg | ORAL_TABLET | Freq: Every day | ORAL | Status: AC
  Administered 2012-08-10 – 2012-08-16 (×7): 100 mg via ORAL
  Filled 2012-08-10 (×8): qty 1

## 2012-08-10 MED ORDER — METHADONE HCL 5 MG PO TABS
2.5000 mg | ORAL_TABLET | Freq: Two times a day (BID) | ORAL | Status: DC
  Administered 2012-08-10 – 2012-08-20 (×20): 2.5 mg via ORAL
  Filled 2012-08-10 (×20): qty 1

## 2012-08-10 NOTE — Progress Notes (Addendum)
[facility] PROGRESS NOTE  Martin Chapman AVW:[id number] DOB: 11-20-1990 DOA: 07/22/2012 PCP: No primary provider on file.  Assessment/Plan: Principal Problem:  *Drug overdose, multiple drugs Active Problems:  Acute renal failure  Compartment syndrome of upper extremity  Rhabdomyolysis  Shock liver  Aspiration into lower respiratory tract  Postoperative respiratory failure  C. difficile colitis  Encephalopathy  Anemia    1. Acute hypoxic respiratory failure:  Patient presented with altered mental status, secondary to narcotic overdose and alcohol ingestion. He was found to be hypoxic, unable to protect airway, and intubated.. The likely culprit was aspiration pneumonia/pneumonitis. He was managed with ventilatory support and antibiotics, with satisfactory ckinical response, and was successfully extubated on10/27/13. Respiratory status has remained stable since, and he is now saturating at 97%-100% on room air.  2. Hypovolemic/septic shock: This was evident at the time of admission, and was addressed with antibiotics, pressors and volume resuscitation. Pressors were finally weaned off on10/24/13. He did have Lactic acidosis likely secondary to shock and rhabdomyolysis, but this has since resolved.   3. Rhabdomyolysis/Acute renal failure: Patient was found to have elevated CK levels, consistent with rhabdomyolysis, possibly due to substance abuse, sepsis and shock. Rhabdomyolysis has resolved with iv fluids, but patient developed progressive renal failure, necessitating CRRT, then subsequently transitioned to intermittent dialysis. Perm cath placement was on 07/31/12. 4. Elevated LFT's and INR: This was likely due to shock liver. Improving.  5. C. Difficile infection: Patient developed diarrhea on 07/28/12, and C.Difficile PCR was positive. Managing with oral Vancomycin.  6. Hyperglycemia: Patient did have hyperglycemia in the initial days of hospitalization. This has improved,  and may have been secondary to stress of acute illness.  7. Acute encephalopathy: This was toxic/metabolic. EEG revealed findings consistent with mild encephalopathy, and brain MRI brain was negative for acute pathology. B12, Thiamine, ammonia, TSH, all within normal limits.HIV test and RPR were negative. Mental status is close to baseline at this time, although patient occasionally appears disoriented and forgetful.  8. Right upper extremity injury with compartment syndrome and possible vascular injury: Dr Aldean Baker was consulted for management, and performed excision biceps muscle right arm, neurolysis median nerve, secondary closure of triceps and biceps incision, with application of wound vac on 07/24/12. Arch aortogram in OR normal. No vascular deficit in RUE/shoulder. Managing per Dr. Lajoyce Corners and wound care. Wound vac has since been discontinued.   9. Oral thrush: An incidental finding on physical exam on 08/10/12. Commenced on oral Diflucan.      Code Status: Full code.  Family Communication:  Disposition Plan: To be determined.    Brief narrative: 21 years old male with PMH relevant for heroin addiction, depression, anxiety and panic attacks. He overdosed on heroin and alcohol on 07/21/12 and on 07/22/12, he was found unresponsive at about 2:00 pm. He was brought to the ED by friends. At admission he was unresponsive with good response to Narcan. He was hypotensive with MAP;s in the 40's despite aggressive IVF resuscitation. Required Narcan drip to stay awake. His Lactate at admission was 10 and he was hypothermic. Admitted to ICU and managed with fluids, pressors and antibiotics. On 07/24/12, he had fasciotomy and angiography by Dr Lajoyce Corners for right arm compartment syndrome with a right peri clavicular hematoma. Dr Darrick Penna placed a temporary right femoral HD catheter at that time, and patient was commenced on dialysis, for progressive ARF. He was transferred to [facility] service on 08/10/12.     Consultants:  Dr Aldean Baker, orthopedic surgeon.  Dr Jacquelynn Cree, VVS.   Dr Durene Cal, VVS.  Dr Zetta Bills, nephrologist.  Dr Onalee Hua, neurologist.     Procedures:  See hospital course.   Antibiotics:  Oral vancomycin  Cefazolin/Zosyn. Now completed.   HPI/Subjective: No new issues.   Objective: Vital signs in last 24 hours: Temp:  [98.7 F (37.1 C)-100 F (37.8 C)] 98.9 F (37.2 C) (11/08 0435) Pulse Rate:  [96-122] 106  (11/08 0435) Resp:  [18-20] 20  (11/08 0435) BP: (131-154)/(64-110) 153/103 mmHg (11/08 0435) SpO2:  [95 %-100 %] 100 % (11/08 0435) Weight:  [69.4 kg (153 lb)] 69.4 kg (153 lb) (11/08 0435) Weight change:  Last BM Date: 08/08/12  Intake/Output from previous day: 11/07 0701 - 11/08 0700 In: 480 [P.O.:480] Out: -      Physical Exam: General: Seen on dialysis, comfortable, alert, communicative, not short of breath at rest.  HEENT:  Moderate clinical pallor, no jaundice, no conjunctival injection or discharge. Has oral thrush. NECK:  Supple, JVP not seen, no carotid bruits, no palpable lymphadenopathy, no palpable goiter. CHEST:  Clinically clear to auscultation, no wheezes, no crackles. HEART:  Sounds 1 and 2 heard, normal, regular, no murmurs. ABDOMEN:  Flat, soft, non-tender, no palpable organomegaly, no palpable masses, normal bowel sounds. GENITALIA:  Not examined. UPPER EXTREMITIES: Well healing scar noted on right upper arm and shoulder.  LOWER EXTREMITIES:  No pitting edema, palpable peripheral pulses. MUSCULOSKELETAL SYSTEM:  Unremarkable. CENTRAL NERVOUS SYSTEM:  No focal neurologic deficit on gross examination.  Lab Results:  [facility] 08/08/12 0904  WBC 9.9  HGB 7.0*  HCT 20.7*  PLT 497*    Basename 08/08/12 0904  NA 135  K 4.3  CL 93*  CO2 23  GLUCOSE 88  BUN 57*  CREATININE 12.01*  CALCIUM 8.4   Recent Results (from the past 240 hour(s))  CULTURE, BLOOD (ROUTINE X 2)     Status: Normal    Collection Time   08/03/12  2:37 PM      Component Value Range Status Comment   Specimen Description BLOOD RIGHT HAND   Final    Special Requests BOTTLES DRAWN AEROBIC AND ANAEROBIC 10CC   Final    Culture  Setup Time 08/03/2012 21:42   Final    Culture NO GROWTH 5 DAYS   Final    Report Status 08/09/2012 FINAL   Final   CULTURE, BLOOD (ROUTINE X 2)     Status: Normal   Collection Time   08/03/12  3:51 PM      Component Value Range Status Comment   Specimen Description BLOOD ARM LEFT   Final    Special Requests BOTTLES DRAWN AEROBIC ONLY 10CC   Final    Culture  Setup Time 08/03/2012 21:42   Final    Culture NO GROWTH 5 DAYS   Final    Report Status 08/09/2012 FINAL   Final   CSF CULTURE     Status: Normal   Collection Time   08/05/12  5:50 PM      Component Value Range Status Comment   Specimen Description CSF   Final    Special Requests NONE   Final    Gram Stain     Final    Value: CYTOSPIN NO WBC SEEN     NO ORGANISMS SEEN   Culture NO GROWTH 3 DAYS   Final    Report Status 08/09/2012 FINAL   Final   AFB CULTURE WITH SMEAR     Status:  Normal (Preliminary result)   Collection Time   08/05/12  5:50 PM      Component Value Range Status Comment   Specimen Description CSF   Final    Special Requests NONE   Final    ACID FAST SMEAR NOT DONE   Final    Culture     Final    Value: CULTURE WILL BE EXAMINED FOR 6 WEEKS BEFORE ISSUING A FINAL REPORT   Report Status PENDING   Incomplete   FUNGUS CULTURE W SMEAR     Status: Normal (Preliminary result)   Collection Time   08/05/12  5:50 PM      Component Value Range Status Comment   Specimen Description CSF   Final    Special Requests NONE   Final    Fungal Smear NO YEAST OR FUNGAL ELEMENTS SEEN   Final    Culture CULTURE IN PROGRESS FOR FOUR WEEKS   Final    Report Status PENDING   Incomplete   CULTURE, BLOOD (ROUTINE X 2)     Status: Normal (Preliminary result)   Collection Time   08/08/12  1:40 PM      Component Value Range  Status Comment   Specimen Description BLOOD LEFT HAND   Final    Special Requests BOTTLES DRAWN AEROBIC ONLY 10CC   Final    Culture  Setup Time 08/08/2012 19:01   Final    Culture     Final    Value:        BLOOD CULTURE RECEIVED NO GROWTH TO DATE CULTURE WILL BE HELD FOR 5 DAYS BEFORE ISSUING A FINAL NEGATIVE REPORT   Report Status PENDING   Incomplete   CULTURE, BLOOD (ROUTINE X 2)     Status: Normal (Preliminary result)   Collection Time   08/08/12  1:50 PM      Component Value Range Status Comment   Specimen Description BLOOD RIGHT HAND   Final    Special Requests BOTTLES DRAWN AEROBIC ONLY 10CC   Final    Culture  Setup Time 08/08/2012 19:01   Final    Culture     Final    Value:        BLOOD CULTURE RECEIVED NO GROWTH TO DATE CULTURE WILL BE HELD FOR 5 DAYS BEFORE ISSUING A FINAL NEGATIVE REPORT   Report Status PENDING   Incomplete      Studies/Results: Dg Chest Port 1 View  08/08/2012  *RADIOLOGY REPORT*  Clinical Data: Fever  PORTABLE CHEST - 1 VIEW  Comparison: 07/31/2012  Findings: Left large bore catheter tips project over the mid/distal SVC.  Heart size upper normal to mildly enlarged.  Mild central vascular congestion. Mild peribronchial thickening. The above findings may be accentuated by hypoaeration. No confluent airspace opacity.  No pleural effusion or pneumothorax.  No acute osseous finding.  IMPRESSION: Mild peribronchial thickening is nonspecific, can be seen with bronchitis or mild edema.  No confluent airspace opacity.   Original Report Authenticated By: Jearld Lesch, M.D.     Medications: Scheduled Meds:   . Chlorhexidine Gluconate Cloth  6 each Topical Q0600  . cloNIDine  0.1 mg Oral QHS  . darbepoetin (ARANESP) injection - DIALYSIS  150 mcg Intravenous Q Sat-HD  . divalproex  250 mg Oral Q12H  . famotidine  20 mg Oral Daily  . feeding supplement  1 Container Oral TID BM  . ferric gluconate (FERRLECIT/NULECIT) IV  125 mg Intravenous Q M,W,F-HD  . heparin  subcutaneous  5,000 Units Subcutaneous Q8H  . LORazepam  2 mg Oral BID  . methadone  5 mg Oral BID  . multivitamin  1 tablet Oral QHS  . saccharomyces boulardii  250 mg Oral BID  . silver sulfADIAZINE   Topical Daily  . thiamine  100 mg Oral Daily  . vancomycin  500 mg Per Tube Q6H  . [DISCONTINUED] methadone  5 mg Oral BID   Continuous Infusions:   . sodium chloride 20 mL/hr at 08/05/12 0645  . dextrose 5 % and 0.9% NaCl 10 mL/hr at 07/29/12 0914   PRN Meds:.acetaminophen (TYLENOL) oral liquid 160 mg/5 mL, haloperidol lactate, heparin, lidocaine, ondansetron, oxyCODONE, pentafluoroprop-tetrafluoroeth    LOS: 19 days   Taquisha Phung,CHRISTOPHER  [facility] Pager [phone]. If 8PM-8AM, please contact night-coverage at [email or web address], password [place] 08/10/2012, 9:03 AM  LOS: 19 days

## 2012-08-10 NOTE — Progress Notes (Signed)
Physical Therapy Treatment Patient Details Name: Martin Chapman MRN: [id number] DOB: 1990-11-16 Today's Date: 08/10/2012 Time: 2536-6440 PT Time Calculation (min): 23 min  PT Assessment / Plan / Recommendation Comments on Treatment Session  Pt. s/p drug overdose with decr mobility secondary to decr endurance, decr balance and decr safety awareness.  Will benefit from continued PT to progress ambulation and balance.  Improved ambulation today. Request Rehab to reconsult as pt is doing better and feel that he would benefit from a Rehab stay prior to d/c to community.      Follow Up Recommendations  CIR     Does the patient have the potential to tolerate intense rehabilitation  Yes, Recommend IP Rehab Screening  Barriers to Discharge        Equipment Recommendations  Tub/shower seat    Recommendations for Other Services Rehab consult  Frequency Min 3X/week   Plan Discharge plan needs to be updated;Frequency remains appropriate    Precautions / Restrictions Precautions Precautions: Fall Restrictions Weight Bearing Restrictions: No   Pertinent Vitals/Pain VSS, No pain    Mobility  Bed Mobility Bed Mobility: Rolling Left;Left Sidelying to Sit;Sitting - Scoot to Edge of Bed Rolling Left: 5: Supervision Left Sidelying to Sit: 5: Supervision;HOB flat Supine to Sit: 5: Supervision;HOB flat Sitting - Scoot to Edge of Bed: 4: Min guard Sit to Supine: 5: Supervision Details for Bed Mobility Assistance: cues for safety. Impulsivitiy noted Transfers Transfers: Sit to Stand;Stand to Sit Sit to Stand: 4: Min assist;With upper extremity assist;From bed Stand to Sit: 4: Min assist;With upper extremity assist;To bed Details for Transfer Assistance: cues for sit to stand with need to steady patient at EOB initiallly on stance.  Pt with impulsivity with movements affecting balance.   Ambulation/Gait Ambulation/Gait Assistance: 1: +2 Total assist Ambulation/Gait: Patient Percentage:  60% Ambulation Distance (Feet): 450 Feet Assistive device: None Ambulation/Gait Assistance Details: Patient with much improved gait over what was documented last treatment.  Pt. did not need HHA.  Pt. with minor full body tremors once sitting EOB which became worse as pt fatigued but were never severe enough to cause too much difficulty with gait.  Pt. with uncoordinated gait throughout walk with ataxia noted at times.  Right LE weakness noted at hip and knee causing decr postural stability.  Pt. could not tolerate distractions or head turns as when this was presented, he would stagger and need min to mod assistance.   Gait Pattern: Step-through pattern;Decreased stride length;Decreased step length - right;Ataxic Stairs: No Wheelchair Mobility Wheelchair Mobility: No    PT Goals Acute Rehab PT Goals PT Goal: Supine/Side to Sit - Progress: Progressing toward goal PT Goal: Sit to Stand - Progress: Progressing toward goal PT Goal: Stand to Sit - Progress: Progressing toward goal PT Transfer Goal: Bed to Chair/Chair to Bed - Progress: Progressing toward goal PT Goal: Ambulate - Progress: Progressing toward goal  Visit Information  Last PT Received On: 08/10/12 Assistance Needed: +2    Subjective Data  Subjective: "My right hand hurts and that is my biggest problem." Patient Stated Goal: To go home with grandma per pt   Cognition  Overall Cognitive Status: Impaired Area of Impairment: Attention;Memory;Safety/judgement;Problem solving;Awareness of deficits Arousal/Alertness: Awake/alert Orientation Level: Disoriented to;Situation Behavior During Session: [facility] for tasks performed (Impulsive with movements at times) Current Attention Level: Sustained Memory Deficits: Used phone during session appropriately but could not verbalize what happended to his right UE.   Safety/Judgement: Decreased awareness of safety precautions;Decreased safety judgement for  tasks assessed;Decreased awareness of  need for assistance;Impulsive Safety/Judgement - Other Comments: Pt with decr awareness that he should not get up without assistance.  Confabulation about what happened to him.   Awareness of Deficits: Poor safety awareness overall with poor awareness of deficits (unaware of balance deficit with ambulation losing balance needing mod assist with no attempts to correct balance on own.) Problem Solving: mod assist for novel tasks    Balance  Dynamic Sitting Balance Dynamic Sitting - Balance Support: No upper extremity supported;Feet supported Dynamic Sitting - Level of Assistance: 5: Stand by assistance Dynamic Sitting Balance - Compensations: Sat on EOB and with incr time was able to don both socks.   Static Standing Balance Static Standing - Balance Support: No upper extremity supported;During functional activity Static Standing - Level of Assistance: 4: Min assist Single Leg Stance - Right Leg: 10  Tandem Stance - Right Leg: 1  (pt lost balance significantly) Dynamic Standing Balance Dynamic Standing - Balance Support: No upper extremity supported;During functional activity Dynamic Standing - Level of Assistance: 3: Mod assist High Level Balance High Level Balance Activites: Turns;Head turns High Level Balance Comments: Pt. lost balance significantly with head turns and turns needing mod assist to regain stablility.  End of Session PT - End of Session Equipment Utilized During Treatment: Gait belt Activity Tolerance: Patient limited by fatigue Patient left: in bed;with call bell/phone within reach;with bed alarm set Nurse Communication: Mobility status        INGOLD,Julann Mcgilvray 08/10/2012, 2:11 PM [facility] [phone] (pager)

## 2012-08-10 NOTE — Progress Notes (Signed)
[facility] ROUNDING NOTE   Subjective:   Interval History: none  Objective:  Vital signs in last 24 hours:  Temp:  [98.7 F (37.1 C)-100 F (37.8 C)] 98.9 F (37.2 C) (11/08 0435) Pulse Rate:  [96-122] 106  (11/08 0435) Resp:  [18-20] 20  (11/08 0435) BP: (131-154)/(64-110) 153/103 mmHg (11/08 0435) SpO2:  [95 %-100 %] 100 % (11/08 0435) Weight:  [69.4 kg (153 lb)] 69.4 kg (153 lb) (11/08 0435)  Weight change:  Filed Weights   08/08/12 0500 08/08/12 0700 08/10/12 0435  Weight: 75.6 kg (166 lb 10.7 oz) 78.1 kg (172 lb 2.9 oz) 69.4 kg (153 lb)    Intake/Output: I/O last 3 completed shifts: In: 480 [P.O.:480] Out: -    Intake/Output this shift:     General - fatigued but awakes to stimulation  HEENT - NG T  CVS RRR  Chest - Tunneled HD catheter on left; Clear  Abd - soft, non tender  Ext - Rt upper arm wrapped in ACE, wound vac removed, 10 mc well healing surgical incision with good granulation tissue generalized edema of RUE; Good pulses all extremities; LUE without swelling  Neuro - improved mentation and behavior, responds appropriately to questions       Basic Metabolic Panel:  Lab 08/08/12 8657 08/06/12 0500 08/04/12 0448  NA 135 138 139  K 4.3 5.2* 4.1  CL 93* 100 100  CO2 23 20 23   GLUCOSE 88 92 109*  BUN 57* 74* 32*  CREATININE 12.01* 14.61* 8.44*  CALCIUM 8.4 8.7 8.5  MG -- -- --  PHOS 8.2* 7.9* 5.4*    Liver Function Tests:  Lab 08/08/12 0904 08/06/12 0500 08/04/12 0448  AST -- 32 --  ALT -- 12 --  ALKPHOS -- 81 --  BILITOT -- 0.4 --  PROT -- 5.8* --  ALBUMIN 2.7* 2.8* 2.6*   No results found for this basename: LIPASE:5,AMYLASE:5 in the last 168 hours No results found for this basename: AMMONIA:3 in the last 168 hours  CBC:  Lab 08/08/12 0904 08/06/12 0500 08/04/12 0448  WBC 9.9 13.1* 13.3*  NEUTROABS -- 9.6* --  HGB 7.0* 7.5* 7.6*  HCT 20.7* 22.4* 22.9*  MCV 85.2 85.5 87.4  PLT 497* 467* 427*    Cardiac  Enzymes: No results found for this basename: CKTOTAL:5,CKMB:5,CKMBINDEX:5,TROPONINI:5 in the last 168 hours  BNP: No components found with this basename: POCBNP:5  CBG:  Lab 08/06/12 2027 08/06/12 1608 08/06/12 1118 08/06/12 0821 08/06/12 0416  GLUCAP 97 100* 87 94 87    Microbiology: Results for orders placed during the hospital encounter of 07/22/12  URINE CULTURE     Status: Normal   Collection Time   07/22/12  4:32 PM      Component Value Range Status Comment   Specimen Description URINE, CATHETERIZED   Final    Special Requests NONE   Final    Culture  Setup Time 07/23/2012 11:13   Final    Colony Count NO GROWTH   Final    Culture NO GROWTH   Final    Report Status 07/24/2012 FINAL   Final   CULTURE, BLOOD (ROUTINE X 2)     Status: Normal   Collection Time   07/22/12  6:42 PM      Component Value Range Status Comment   Specimen Description BLOOD LEFT ARM   Final    Special Requests BOTTLES DRAWN AEROBIC ONLY   Final    Culture  Setup Time  07/23/2012 10:16   Final    Culture NO GROWTH 5 DAYS   Final    Report Status 07/29/2012 FINAL   Final   CULTURE, BLOOD (ROUTINE X 2)     Status: Normal   Collection Time   07/22/12  6:51 PM      Component Value Range Status Comment   Specimen Description BLOOD RIGHT ARM   Final    Special Requests BOTTLES DRAWN AEROBIC ONLY   Final    Culture  Setup Time 07/23/2012 10:16   Final    Culture NO GROWTH 5 DAYS   Final    Report Status 07/29/2012 FINAL   Final   MRSA PCR SCREENING     Status: Normal   Collection Time   07/23/12 12:30 AM      Component Value Range Status Comment   MRSA by PCR NEGATIVE  NEGATIVE Final   CULTURE, BLOOD (ROUTINE X 2)     Status: Normal   Collection Time   07/25/12  8:30 AM      Component Value Range Status Comment   Specimen Description BLOOD LEFT HAND   Final    Special Requests BOTTLES DRAWN AEROBIC AND ANAEROBIC 10CC   Final    Culture  Setup Time 07/25/2012 13:56   Final    Culture NO  GROWTH 5 DAYS   Final    Report Status 07/31/2012 FINAL   Final   CULTURE, BLOOD (ROUTINE X 2)     Status: Normal   Collection Time   07/25/12  8:40 AM      Component Value Range Status Comment   Specimen Description BLOOD RIGHT HAND   Final    Special Requests BOTTLES DRAWN AEROBIC AND ANAEROBIC 10CC   Final    Culture  Setup Time 07/25/2012 13:56   Final    Culture NO GROWTH 5 DAYS   Final    Report Status 07/31/2012 FINAL   Final   URINE CULTURE     Status: Normal   Collection Time   07/25/12  8:49 AM      Component Value Range Status Comment   Specimen Description URINE, CATHETERIZED   Final    Special Requests NONE   Final    Culture  Setup Time 07/25/2012 15:30   Final    Colony Count NO GROWTH   Final    Culture NO GROWTH   Final    Report Status 07/26/2012 FINAL   Final   CLOSTRIDIUM DIFFICILE BY PCR     Status: Abnormal   Collection Time   07/28/12  6:15 AM      Component Value Range Status Comment   C difficile by pcr POSITIVE (*) NEGATIVE Final   CULTURE, BLOOD (ROUTINE X 2)     Status: Normal   Collection Time   08/03/12  2:37 PM      Component Value Range Status Comment   Specimen Description BLOOD RIGHT HAND   Final    Special Requests BOTTLES DRAWN AEROBIC AND ANAEROBIC 10CC   Final    Culture  Setup Time 08/03/2012 21:42   Final    Culture NO GROWTH 5 DAYS   Final    Report Status 08/09/2012 FINAL   Final   CULTURE, BLOOD (ROUTINE X 2)     Status: Normal   Collection Time   08/03/12  3:51 PM      Component Value Range Status Comment   Specimen Description BLOOD ARM LEFT   Final  Special Requests BOTTLES DRAWN AEROBIC ONLY 10CC   Final    Culture  Setup Time 08/03/2012 21:42   Final    Culture NO GROWTH 5 DAYS   Final    Report Status 08/09/2012 FINAL   Final   CSF CULTURE     Status: Normal   Collection Time   08/05/12  5:50 PM      Component Value Range Status Comment   Specimen Description CSF   Final    Special Requests NONE   Final    Gram Stain      Final    Value: CYTOSPIN NO WBC SEEN     NO ORGANISMS SEEN   Culture NO GROWTH 3 DAYS   Final    Report Status 08/09/2012 FINAL   Final   AFB CULTURE WITH SMEAR     Status: Normal (Preliminary result)   Collection Time   08/05/12  5:50 PM      Component Value Range Status Comment   Specimen Description CSF   Final    Special Requests NONE   Final    ACID FAST SMEAR NOT DONE   Final    Culture     Final    Value: CULTURE WILL BE EXAMINED FOR 6 WEEKS BEFORE ISSUING A FINAL REPORT   Report Status PENDING   Incomplete   FUNGUS CULTURE W SMEAR     Status: Normal (Preliminary result)   Collection Time   08/05/12  5:50 PM      Component Value Range Status Comment   Specimen Description CSF   Final    Special Requests NONE   Final    Fungal Smear NO YEAST OR FUNGAL ELEMENTS SEEN   Final    Culture CULTURE IN PROGRESS FOR FOUR WEEKS   Final    Report Status PENDING   Incomplete   CULTURE, BLOOD (ROUTINE X 2)     Status: Normal (Preliminary result)   Collection Time   08/08/12  1:40 PM      Component Value Range Status Comment   Specimen Description BLOOD LEFT HAND   Final    Special Requests BOTTLES DRAWN AEROBIC ONLY 10CC   Final    Culture  Setup Time 08/08/2012 19:01   Final    Culture     Final    Value:        BLOOD CULTURE RECEIVED NO GROWTH TO DATE CULTURE WILL BE HELD FOR 5 DAYS BEFORE ISSUING A FINAL NEGATIVE REPORT   Report Status PENDING   Incomplete   CULTURE, BLOOD (ROUTINE X 2)     Status: Normal (Preliminary result)   Collection Time   08/08/12  1:50 PM      Component Value Range Status Comment   Specimen Description BLOOD RIGHT HAND   Final    Special Requests BOTTLES DRAWN AEROBIC ONLY 10CC   Final    Culture  Setup Time 08/08/2012 19:01   Final    Culture     Final    Value:        BLOOD CULTURE RECEIVED NO GROWTH TO DATE CULTURE WILL BE HELD FOR 5 DAYS BEFORE ISSUING A FINAL NEGATIVE REPORT   Report Status PENDING   Incomplete     Coagulation Studies: No results  found for this basename: LABPROT:5,INR:5 in the last 72 hours  Urinalysis: No results found for this basename: COLORURINE:2,APPERANCEUR:2,LABSPEC:2,PHURINE:2,GLUCOSEU:2,HGBUR:2,BILIRUBINUR:2,KETONESUR:2,PROTEINUR:2,UROBILINOGEN:2,NITRITE:2,LEUKOCYTESUR:2 in the last 72 hours    Imaging: Dg Chest Port 1 View  08/08/2012  *RADIOLOGY  REPORT*  Clinical Data: Fever  PORTABLE CHEST - 1 VIEW  Comparison: 07/31/2012  Findings: Left large bore catheter tips project over the mid/distal SVC.  Heart size upper normal to mildly enlarged.  Mild central vascular congestion. Mild peribronchial thickening. The above findings may be accentuated by hypoaeration. No confluent airspace opacity.  No pleural effusion or pneumothorax.  No acute osseous finding.  IMPRESSION: Mild peribronchial thickening is nonspecific, can be seen with bronchitis or mild edema.  No confluent airspace opacity.   Original Report Authenticated By: Jearld Lesch, M.D.      Medications:      . sodium chloride 20 mL/hr at 08/05/12 0645  . dextrose 5 % and 0.9% NaCl 10 mL/hr at 07/29/12 0914      . Chlorhexidine Gluconate Cloth  6 each Topical Q0600  . cloNIDine  0.1 mg Oral QHS  . darbepoetin (ARANESP) injection - DIALYSIS  150 mcg Intravenous Q Sat-HD  . divalproex  250 mg Oral Q12H  . famotidine  20 mg Oral Daily  . feeding supplement  1 Container Oral TID BM  . ferric gluconate (FERRLECIT/NULECIT) IV  125 mg Intravenous Q M,W,F-HD  . heparin subcutaneous  5,000 Units Subcutaneous Q8H  . LORazepam  2 mg Oral BID  . methadone  5 mg Oral BID  . multivitamin  1 tablet Oral QHS  . saccharomyces boulardii  250 mg Oral BID  . silver sulfADIAZINE   Topical Daily  . thiamine  100 mg Oral Daily  . 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 Bicep excised  10/25 - Vac change  10/26 - diarrhea>>C diff positive  10/27 - extubated  10/27 - agitation, precedex started  10/29 - HD cath placement  Acute Renal Failure secondary to ATN and Rhabdomyolysis. There appears to be no sign of recovery.      LOS: 19 [address]icher W @TODAY @10 :54 AM

## 2012-08-11 LAB — COMPREHENSIVE METABOLIC PANEL
ALT: 18 U/L (ref 0–53)
Albumin: 3.7 g/dL (ref 3.5–5.2)
Alkaline Phosphatase: 99 U/L (ref 39–117)
Chloride: 91 mEq/L — ABNORMAL LOW (ref 96–112)
GFR calc Af Amer: 15 mL/min — ABNORMAL LOW (ref >90)
Glucose, Bld: 113 mg/dL — ABNORMAL HIGH (ref 70–99)
Potassium: 4.3 mEq/L (ref 3.5–5.1)
Sodium: 134 mEq/L — ABNORMAL LOW (ref 135–145)
Total Protein: 7.7 g/dL (ref 6.0–8.3)

## 2012-08-11 LAB — CBC
HCT: 26.3 % — ABNORMAL LOW (ref 39.0–52.0)
MCHC: 33.1 g/dL (ref 30.0–36.0)
MCV: 85.9 fL (ref 78.0–100.0)
RDW: 14 % (ref 11.5–15.5)

## 2012-08-11 MED ORDER — CLONIDINE HCL 0.1 MG PO TABS
0.1000 mg | ORAL_TABLET | Freq: Two times a day (BID) | ORAL | Status: DC
  Administered 2012-08-11 – 2012-08-12 (×2): 0.1 mg via ORAL
  Filled 2012-08-11 (×3): qty 1

## 2012-08-11 NOTE — Progress Notes (Signed)
[facility] ROUNDING NOTE   Subjective:   Interval History: tolerated dialysis well yesterday  Objective:  Vital signs in last 24 hours:  Temp:  [99.1 F (37.3 C)-100.5 F (38.1 C)] 100.5 F (38.1 C) (11/09 0553) Pulse Rate:  [93-131] 101  (11/09 0704) Resp:  [16-20] 20  (11/09 0553) BP: (142-183)/(87-113) 161/95 mmHg (11/09 0704) SpO2:  [97 %-99 %] 97 % (11/09 0553) Weight:  [64.9 kg (143 lb 1.3 oz)-70 kg (154 lb 5.2 oz)] 64.9 kg (143 lb 1.3 oz) (11/09 0553)  Weight change: 0.6 kg (1 lb 5.2 oz) Filed Weights   08/10/12 1234 08/10/12 1703 08/11/12 0553  Weight: 70 kg (154 lb 5.2 oz) 66.4 kg (146 lb 6.2 oz) 64.9 kg (143 lb 1.3 oz)    Intake/Output: I/O last 3 completed shifts: In: 673 [P.O.:673] Out: 3948 [Other:3948]   Intake/Output this shift:     General - fatigued but awakes to stimulation  HEENT - NG T  CVS RRR  Chest - Tunneled HD catheter on left; Clear  Abd - soft, non tender  Ext - Rt upper arm wrapped in ACE, wound vac removed, 10 mc well healing surgical incision with good granulation tissue generalized edema of RUE; Good pulses all extremities; LUE without swelling  Neuro - improved mentation and behavior, responds appropriately to questions     Basic Metabolic Panel:  Lab 08/11/12 9604 08/10/12 1211 08/08/12 0904 08/06/12 0500  NA 134* 133* 135 138  K 4.3 3.8 4.3 5.2*  CL 91* 92* 93* 100  CO2 23 25 23 20   GLUCOSE 113* 112* 88 92  BUN 17 39* 57* 74*  CREATININE 5.65* 10.12* 12.01* 14.61*  CALCIUM 9.7 8.9 8.4 --  MG -- -- -- --  PHOS -- 7.1* 8.2* 7.9*    Liver Function Tests:  Lab 08/11/12 0645 08/10/12 1211 08/08/12 0904 08/06/12 0500  AST 41* -- -- 32  ALT 18 -- -- 12  ALKPHOS 99 -- -- 81  BILITOT 0.4 -- -- 0.4  PROT 7.7 -- -- 5.8*  ALBUMIN 3.7 3.1* 2.7* 2.8*   No results found for this basename: LIPASE:5,AMYLASE:5 in the last 168 hours No results found for this basename: AMMONIA:3 in the last 168 hours  CBC:  Lab  08/11/12 0645 08/10/12 1205 08/08/12 0904 08/06/12 0500  WBC 11.7* 9.6 9.9 13.1*  NEUTROABS -- -- -- 9.6*  HGB 8.7* 7.8* 7.0* 7.5*  HCT 26.3* 23.5* 20.7* 22.4*  MCV 85.9 85.1 85.2 85.5  PLT 581* 549* 497* 467*    Cardiac Enzymes: No results found for this basename: CKTOTAL:5,CKMB:5,CKMBINDEX:5,TROPONINI:5 in the last 168 hours  BNP: No components found with this basename: POCBNP:5  CBG:  Lab 08/06/12 2027 08/06/12 1608 08/06/12 1118 08/06/12 0821 08/06/12 0416  GLUCAP 97 100* 87 94 87    Microbiology: Results for orders placed during the hospital encounter of 07/22/12  URINE CULTURE     Status: Normal   Collection Time   07/22/12  4:32 PM      Component Value Range Status Comment   Specimen Description URINE, CATHETERIZED   Final    Special Requests NONE   Final    Culture  Setup Time 07/23/2012 11:13   Final    Colony Count NO GROWTH   Final    Culture NO GROWTH   Final    Report Status 07/24/2012 FINAL   Final   CULTURE, BLOOD (ROUTINE X 2)     Status: Normal   Collection Time  07/22/12  6:42 PM      Component Value Range Status Comment   Specimen Description BLOOD LEFT ARM   Final    Special Requests BOTTLES DRAWN AEROBIC ONLY   Final    Culture  Setup Time 07/23/2012 10:16   Final    Culture NO GROWTH 5 DAYS   Final    Report Status 07/29/2012 FINAL   Final   CULTURE, BLOOD (ROUTINE X 2)     Status: Normal   Collection Time   07/22/12  6:51 PM      Component Value Range Status Comment   Specimen Description BLOOD RIGHT ARM   Final    Special Requests BOTTLES DRAWN AEROBIC ONLY   Final    Culture  Setup Time 07/23/2012 10:16   Final    Culture NO GROWTH 5 DAYS   Final    Report Status 07/29/2012 FINAL   Final   MRSA PCR SCREENING     Status: Normal   Collection Time   07/23/12 12:30 AM      Component Value Range Status Comment   MRSA by PCR NEGATIVE  NEGATIVE Final   CULTURE, BLOOD (ROUTINE X 2)     Status: Normal   Collection Time   07/25/12   8:30 AM      Component Value Range Status Comment   Specimen Description BLOOD LEFT HAND   Final    Special Requests BOTTLES DRAWN AEROBIC AND ANAEROBIC 10CC   Final    Culture  Setup Time 07/25/2012 13:56   Final    Culture NO GROWTH 5 DAYS   Final    Report Status 07/31/2012 FINAL   Final   CULTURE, BLOOD (ROUTINE X 2)     Status: Normal   Collection Time   07/25/12  8:40 AM      Component Value Range Status Comment   Specimen Description BLOOD RIGHT HAND   Final    Special Requests BOTTLES DRAWN AEROBIC AND ANAEROBIC 10CC   Final    Culture  Setup Time 07/25/2012 13:56   Final    Culture NO GROWTH 5 DAYS   Final    Report Status 07/31/2012 FINAL   Final   URINE CULTURE     Status: Normal   Collection Time   07/25/12  8:49 AM      Component Value Range Status Comment   Specimen Description URINE, CATHETERIZED   Final    Special Requests NONE   Final    Culture  Setup Time 07/25/2012 15:30   Final    Colony Count NO GROWTH   Final    Culture NO GROWTH   Final    Report Status 07/26/2012 FINAL   Final   CLOSTRIDIUM DIFFICILE BY PCR     Status: Abnormal   Collection Time   07/28/12  6:15 AM      Component Value Range Status Comment   C difficile by pcr POSITIVE (*) NEGATIVE Final   CULTURE, BLOOD (ROUTINE X 2)     Status: Normal   Collection Time   08/03/12  2:37 PM      Component Value Range Status Comment   Specimen Description BLOOD RIGHT HAND   Final    Special Requests BOTTLES DRAWN AEROBIC AND ANAEROBIC 10CC   Final    Culture  Setup Time 08/03/2012 21:42   Final    Culture NO GROWTH 5 DAYS   Final    Report Status 08/09/2012 FINAL   Final  CULTURE, BLOOD (ROUTINE X 2)     Status: Normal   Collection Time   08/03/12  3:51 PM      Component Value Range Status Comment   Specimen Description BLOOD ARM LEFT   Final    Special Requests BOTTLES DRAWN AEROBIC ONLY 10CC   Final    Culture  Setup Time 08/03/2012 21:42   Final    Culture NO GROWTH 5 DAYS   Final    Report  Status 08/09/2012 FINAL   Final   CSF CULTURE     Status: Normal   Collection Time   08/05/12  5:50 PM      Component Value Range Status Comment   Specimen Description CSF   Final    Special Requests NONE   Final    Gram Stain     Final    Value: CYTOSPIN NO WBC SEEN     NO ORGANISMS SEEN   Culture NO GROWTH 3 DAYS   Final    Report Status 08/09/2012 FINAL   Final   AFB CULTURE WITH SMEAR     Status: Normal (Preliminary result)   Collection Time   08/05/12  5:50 PM      Component Value Range Status Comment   Specimen Description CSF   Final    Special Requests NONE   Final    ACID FAST SMEAR NOT DONE   Final    Culture     Final    Value: CULTURE WILL BE EXAMINED FOR 6 WEEKS BEFORE ISSUING A FINAL REPORT   Report Status PENDING   Incomplete   FUNGUS CULTURE W SMEAR     Status: Normal (Preliminary result)   Collection Time   08/05/12  5:50 PM      Component Value Range Status Comment   Specimen Description CSF   Final    Special Requests NONE   Final    Fungal Smear NO YEAST OR FUNGAL ELEMENTS SEEN   Final    Culture CULTURE IN PROGRESS FOR FOUR WEEKS   Final    Report Status PENDING   Incomplete   CULTURE, BLOOD (ROUTINE X 2)     Status: Normal (Preliminary result)   Collection Time   08/08/12  1:40 PM      Component Value Range Status Comment   Specimen Description BLOOD LEFT HAND   Final    Special Requests BOTTLES DRAWN AEROBIC ONLY 10CC   Final    Culture  Setup Time 08/08/2012 19:01   Final    Culture     Final    Value:        BLOOD CULTURE RECEIVED NO GROWTH TO DATE CULTURE WILL BE HELD FOR 5 DAYS BEFORE ISSUING A FINAL NEGATIVE REPORT   Report Status PENDING   Incomplete   CULTURE, BLOOD (ROUTINE X 2)     Status: Normal (Preliminary result)   Collection Time   08/08/12  1:50 PM      Component Value Range Status Comment   Specimen Description BLOOD RIGHT HAND   Final    Special Requests BOTTLES DRAWN AEROBIC ONLY 10CC   Final    Culture  Setup Time 08/08/2012 19:01    Final    Culture     Final    Value:        BLOOD CULTURE RECEIVED NO GROWTH TO DATE CULTURE WILL BE HELD FOR 5 DAYS BEFORE ISSUING A FINAL NEGATIVE REPORT   Report Status PENDING   Incomplete  Coagulation Studies: No results found for this basename: LABPROT:5,INR:5 in the last 72 hours  Urinalysis: No results found for this basename: COLORURINE:2,APPERANCEUR:2,LABSPEC:2,PHURINE:2,GLUCOSEU:2,HGBUR:2,BILIRUBINUR:2,KETONESUR:2,PROTEINUR:2,UROBILINOGEN:2,NITRITE:2,LEUKOCYTESUR:2 in the last 72 hours    Imaging: No results found.   Medications:      . sodium chloride 20 mL/hr at 08/05/12 0645  . dextrose 5 % and 0.9% NaCl 10 mL/hr at 07/29/12 0914      . Chlorhexidine Gluconate Cloth  6 each Topical Q0600  . cloNIDine  0.1 mg Oral QHS  . darbepoetin (ARANESP) injection - DIALYSIS  150 mcg Intravenous Q Sat-HD  . divalproex  250 mg Oral Q12H  . famotidine  20 mg Oral Daily  . feeding supplement  1 Container Oral TID BM  . ferric gluconate (FERRLECIT/NULECIT) IV  125 mg Intravenous Q M,W,F-HD  . fluconazole  100 mg Oral q1800  . heparin subcutaneous  5,000 Units Subcutaneous Q8H  . LORazepam  2 mg Oral BID  . methadone  2.5 mg Oral Q12H  . multivitamin  1 tablet Oral QHS  . saccharomyces boulardii  250 mg Oral BID  . silver sulfADIAZINE   Topical Daily  . thiamine  100 mg Oral Daily  . vancomycin  500 mg Per Tube Q6H  . [DISCONTINUED] methadone  5 mg Oral BID   acetaminophen (TYLENOL) oral liquid 160 mg/5 mL, haloperidol lactate, heparin, lidocaine, ondansetron, oxyCODONE, pentafluoroprop-tetrafluoroeth  Assessment/ Plan:  pt adm with heroin and alcohol overdose, R shd /arm compartment syndrome and resulting rhabdo  10/20- Admitted to ICU  10/20 - Surgery with shoulder release and fasciotomy of right arm  10/21 - Started on CVVH>>d/c 10/22  10/22 - Return to OR for I&D with wound vac placement. Bicep excised  10/25 - Vac change  10/26 - diarrhea>>C diff positive    10/27 - extubated  10/27 - agitation, precedex started  10/29 - HD cath placement  Acute Renal Failure secondary to ATN and Rhabdomyolysis. There appears to be no sign of recovery.receiving scheduled dialysis Monday Wednesday and Friday      LOS: 20 Andromeda Poppen W @TODAY @8 :49 AM

## 2012-08-11 NOTE — Progress Notes (Addendum)
[facility] PROGRESS NOTE  ROSENDO COUSER ZOX:[id number] DOB: May 01, 1991 DOA: 07/22/2012 PCP: No primary provider on file.  Assessment/Plan: Principal Problem:  *Drug overdose, multiple drugs Active Problems:  Acute renal failure  Compartment syndrome of upper extremity  Rhabdomyolysis  Shock liver  Aspiration into lower respiratory tract  Postoperative respiratory failure  C. difficile colitis  Encephalopathy  Anemia    1. Acute hypoxic respiratory failure:  Patient presented with altered mental status, secondary to narcotic overdose and alcohol ingestion. He was found to be hypoxic, unable to protect airway, and intubated.. The likely culprit was aspiration pneumonia/pneumonitis. He was managed with ventilatory support and antibiotics, with satisfactory ckinical response, and was successfully extubated on10/27/13. Respiratory status has remained stable since, and he is now saturating at 97%-100% on room air.  2. Hypovolemic/septic shock: This was evident at the time of admission, and was addressed with antibiotics, pressors and volume resuscitation. Pressors were finally weaned off on10/24/13. He did have Lactic acidosis likely secondary to shock and rhabdomyolysis, but this has since resolved.   3. Rhabdomyolysis/Acute renal failure: Patient was found to have elevated CK levels, consistent with rhabdomyolysis, possibly due to substance abuse, sepsis and shock. Rhabdomyolysis has resolved with iv fluids, but patient developed progressive renal failure, necessitating CVVH, then subsequently transitioned to intermittent dialysis. Perm cath placement was on 07/31/12. There appears to be no improvement in patient's renal status at this time. 4. Elevated LFT's and INR: This was likely due to shock liver. Resolved as of 08/11/12.  5. C. Difficile infection: Patient developed diarrhea on 07/28/12, and C.Difficile PCR was positive. Managed with oral Vancomycin. Now on day #15. As  patient's stools have improved, and his last dose of Zosyn was on 08/29/12, we shall discontinue Vancomycin, and re-check C. Difficile PCR.  6. Hyperglycemia: Patient did have hyperglycemia in the initial days of hospitalization. This has resolved, and may have been secondary to stress of acute illness.  7. Acute encephalopathy: This was toxic/metabolic. EEG revealed findings consistent with mild encephalopathy, and brain MRI brain was negative for acute pathology. B12, Thiamine, ammonia, TSH, all within normal limits.HIV test and RPR were negative. Mental status is close to baseline at this time, although patient occasionally appears disoriented and forgetful.  8. Right upper extremity injury with compartment syndrome and possible vascular injury: Dr Aldean Baker was consulted for management, and performed surgery with shoulder release and fasciotomy of right arm on 07/22/12, followed by excision of biceps muscle right arm, neurolysis median nerve, secondary closure of triceps and biceps incision, with application of wound vac on 07/24/12. Arch aortogram in OR normal. No vascular deficit in RUE/shoulder. Managing per Dr. Lajoyce Corners and wound care. Wound vac has since been discontinued.   9. Oral thrush: An incidental finding on physical exam on 08/10/12. Commenced on a 7-day course of oral Diflucan, to be concluded on 08/16/12.      Code Status: Full code.  Family Communication:  Disposition Plan: To be determined. Will start discharge planning on 08/13/12.    Brief narrative: 21 years old male with PMH relevant for heroin addiction, depression, anxiety and panic attacks. He overdosed on heroin and alcohol on 07/21/12 and on 07/22/12, he was found unresponsive at about 2:00 pm. He was brought to the ED by friends. At admission he was unresponsive with good response to Narcan. He was hypotensive with MAP;s in the 40's despite aggressive IVF resuscitation. Required Narcan drip to stay awake. His Lactate at  admission was 10 and he was  hypothermic. Admitted to ICU and managed with fluids, pressors and antibiotics. On 07/24/12, he had fasciotomy and angiography by Dr Lajoyce Corners for right arm compartment syndrome with a right peri clavicular hematoma. Dr Darrick Penna placed a temporary right femoral HD catheter at that time, and patient was commenced on dialysis, for progressive ARF. He was transferred to [facility] service on 08/10/12.    Consultants:  Dr Aldean Baker, orthopedic surgeon.  Dr Jacquelynn Cree, VVS.   Dr Durene Cal, VVS.  Dr Zetta Bills, nephrologist.  Dr Onalee Hua, neurologist.     Procedures:  See hospital course.   Antibiotics:  Oral vancomycin  Cefazolin/Zosyn. Now completed.   HPI/Subjective: No new issues.   Objective: Vital signs in last 24 hours: Temp:  [99.1 F (37.3 C)-100.5 F (38.1 C)] 100.5 F (38.1 C) (11/09 0553) Pulse Rate:  [96-131] 101  (11/09 0704) Resp:  [16-20] 20  (11/09 0553) BP: (142-183)/(87-113) 161/95 mmHg (11/09 0704) SpO2:  [97 %-99 %] 97 % (11/09 0553) Weight:  [64.9 kg (143 lb 1.3 oz)-66.4 kg (146 lb 6.2 oz)] 64.9 kg (143 lb 1.3 oz) (11/09 0553) Weight change: 0.6 kg (1 lb 5.2 oz) Last BM Date: 08/10/12  Intake/Output from previous day: 11/08 0701 - 11/09 0700 In: 673 [P.O.:673] Out: 3948  Total I/O In: 480 [P.O.:480] Out: 3 [Urine:3]   Physical Exam: General: Seen on dialysis, comfortable, alert, communicative, not short of breath at rest.  HEENT:  Moderate clinical pallor, no jaundice, no conjunctival injection or discharge. Has oral thrush. NECK:  Supple, JVP not seen, no carotid bruits, no palpable lymphadenopathy, no palpable goiter. CHEST:  Clinically clear to auscultation, no wheezes, no crackles. HEART:  Sounds 1 and 2 heard, normal, regular, no murmurs. ABDOMEN:  Flat, soft, non-tender, no palpable organomegaly, no palpable masses, normal bowel sounds. GENITALIA:  Not examined. UPPER EXTREMITIES: Well healing scar  noted on right upper arm and shoulder.  LOWER EXTREMITIES:  No pitting edema, palpable peripheral pulses. MUSCULOSKELETAL SYSTEM:  Unremarkable. CENTRAL NERVOUS SYSTEM:  No focal neurologic deficit on gross examination.  Lab Results:  [facility] 08/11/12 0645 08/10/12 1205  WBC 11.7* 9.6  HGB 8.7* 7.8*  HCT 26.3* 23.5*  PLT 581* 549*    Basename 08/11/12 0645 08/10/12 1211  NA 134* 133*  K 4.3 3.8  CL 91* 92*  CO2 23 25  GLUCOSE 113* 112*  BUN 17 39*  CREATININE 5.65* 10.12*  CALCIUM 9.7 8.9   Recent Results (from the past 240 hour(s))  CULTURE, BLOOD (ROUTINE X 2)     Status: Normal   Collection Time   08/03/12  2:37 PM      Component Value Range Status Comment   Specimen Description BLOOD RIGHT HAND   Final    Special Requests BOTTLES DRAWN AEROBIC AND ANAEROBIC 10CC   Final    Culture  Setup Time 08/03/2012 21:42   Final    Culture NO GROWTH 5 DAYS   Final    Report Status 08/09/2012 FINAL   Final   CULTURE, BLOOD (ROUTINE X 2)     Status: Normal   Collection Time   08/03/12  3:51 PM      Component Value Range Status Comment   Specimen Description BLOOD ARM LEFT   Final    Special Requests BOTTLES DRAWN AEROBIC ONLY 10CC   Final    Culture  Setup Time 08/03/2012 21:42   Final    Culture NO GROWTH 5 DAYS   Final    Report Status  08/09/2012 FINAL   Final   CSF CULTURE     Status: Normal   Collection Time   08/05/12  5:50 PM      Component Value Range Status Comment   Specimen Description CSF   Final    Special Requests NONE   Final    Gram Stain     Final    Value: CYTOSPIN NO WBC SEEN     NO ORGANISMS SEEN   Culture NO GROWTH 3 DAYS   Final    Report Status 08/09/2012 FINAL   Final   AFB CULTURE WITH SMEAR     Status: Normal (Preliminary result)   Collection Time   08/05/12  5:50 PM      Component Value Range Status Comment   Specimen Description CSF   Final    Special Requests NONE   Final    ACID FAST SMEAR NOT DONE   Final    Culture     Final    Value:  CULTURE WILL BE EXAMINED FOR 6 WEEKS BEFORE ISSUING A FINAL REPORT   Report Status PENDING   Incomplete   FUNGUS CULTURE W SMEAR     Status: Normal (Preliminary result)   Collection Time   08/05/12  5:50 PM      Component Value Range Status Comment   Specimen Description CSF   Final    Special Requests NONE   Final    Fungal Smear NO YEAST OR FUNGAL ELEMENTS SEEN   Final    Culture CULTURE IN PROGRESS FOR FOUR WEEKS   Final    Report Status PENDING   Incomplete   CULTURE, BLOOD (ROUTINE X 2)     Status: Normal (Preliminary result)   Collection Time   08/08/12  1:40 PM      Component Value Range Status Comment   Specimen Description BLOOD LEFT HAND   Final    Special Requests BOTTLES DRAWN AEROBIC ONLY 10CC   Final    Culture  Setup Time 08/08/2012 19:01   Final    Culture     Final    Value:        BLOOD CULTURE RECEIVED NO GROWTH TO DATE CULTURE WILL BE HELD FOR 5 DAYS BEFORE ISSUING A FINAL NEGATIVE REPORT   Report Status PENDING   Incomplete   CULTURE, BLOOD (ROUTINE X 2)     Status: Normal (Preliminary result)   Collection Time   08/08/12  1:50 PM      Component Value Range Status Comment   Specimen Description BLOOD RIGHT HAND   Final    Special Requests BOTTLES DRAWN AEROBIC ONLY 10CC   Final    Culture  Setup Time 08/08/2012 19:01   Final    Culture     Final    Value:        BLOOD CULTURE RECEIVED NO GROWTH TO DATE CULTURE WILL BE HELD FOR 5 DAYS BEFORE ISSUING A FINAL NEGATIVE REPORT   Report Status PENDING   Incomplete      Studies/Results: No results found.  Medications: Scheduled Meds:    . Chlorhexidine Gluconate Cloth  6 each Topical Q0600  . cloNIDine  0.1 mg Oral QHS  . darbepoetin (ARANESP) injection - DIALYSIS  150 mcg Intravenous Q Sat-HD  . divalproex  250 mg Oral Q12H  . famotidine  20 mg Oral Daily  . feeding supplement  1 Container Oral TID BM  . ferric gluconate (FERRLECIT/NULECIT) IV  125 mg Intravenous Q M,W,F-HD  .  fluconazole  100 mg Oral q1800    . heparin subcutaneous  5,000 Units Subcutaneous Q8H  . LORazepam  2 mg Oral BID  . methadone  2.5 mg Oral Q12H  . multivitamin  1 tablet Oral QHS  . saccharomyces boulardii  250 mg Oral BID  . silver sulfADIAZINE   Topical Daily  . thiamine  100 mg Oral Daily  . vancomycin  500 mg Per Tube Q6H  . [DISCONTINUED] methadone  5 mg Oral BID   Continuous Infusions:    . sodium chloride 20 mL/hr at 08/05/12 0645  . dextrose 5 % and 0.9% NaCl 10 mL/hr at 07/29/12 0914   PRN Meds:.acetaminophen (TYLENOL) oral liquid 160 mg/5 mL, haloperidol lactate, heparin, lidocaine, ondansetron, oxyCODONE, pentafluoroprop-tetrafluoroeth    LOS: 20 days   Geryl Dohn,CHRISTOPHER  [facility] Pager [phone]. If 8PM-8AM, please contact night-coverage at [email or web address], password [facility] 08/11/2012, 1:56 PM  LOS: 20 days

## 2012-08-12 LAB — RENAL FUNCTION PANEL
Albumin: 3.5 g/dL (ref 3.5–5.2)
Calcium: 9.5 mg/dL (ref 8.4–10.5)
Creatinine, Ser: 6.54 mg/dL — ABNORMAL HIGH (ref 0.50–1.35)
GFR calc non Af Amer: 11 mL/min — ABNORMAL LOW (ref >90)

## 2012-08-12 LAB — CBC
MCH: 28.3 pg (ref 26.0–34.0)
MCV: 85.7 fL (ref 78.0–100.0)
Platelets: 603 10*3/uL — ABNORMAL HIGH (ref 150–400)
RDW: 13.8 % (ref 11.5–15.5)

## 2012-08-12 MED ORDER — CLONIDINE HCL 0.1 MG PO TABS
0.1000 mg | ORAL_TABLET | Freq: Three times a day (TID) | ORAL | Status: DC
  Administered 2012-08-12 (×2): 0.1 mg via ORAL
  Filled 2012-08-12 (×5): qty 1

## 2012-08-12 MED ORDER — DARBEPOETIN ALFA-POLYSORBATE 150 MCG/0.3ML IJ SOLN
150.0000 ug | INTRAMUSCULAR | Status: DC
  Administered 2012-08-13: 150 ug via INTRAVENOUS
  Filled 2012-08-12 (×2): qty 0.3

## 2012-08-12 NOTE — Progress Notes (Signed)
08/12/2012 11:00 AM Nursing note Report called to RN on [id number] to receive patient. Pt. Mother and father at bedside and updated on transfer. Questions and concerns addressed. Pt. Transferred to [id number] with RN and NT per verbal orders Dr. Brien Few.  Mikah Rottinghaus, Blanchard Kelch

## 2012-08-12 NOTE — Progress Notes (Signed)
[facility] PROGRESS NOTE  Martin Chapman ZOX:[id number] DOB: [birth year] DOA: 07/22/2012 PCP: No primary provider on file.  Assessment/Plan: Principal Problem:  *Drug overdose, multiple drugs Active Problems:  Acute renal failure  Compartment syndrome of upper extremity  Rhabdomyolysis  Shock liver  Aspiration into lower respiratory tract  Postoperative respiratory failure  C. difficile colitis  Encephalopathy  Anemia    1. Acute hypoxic respiratory failure:  Patient presented with altered mental status, secondary to narcotic overdose and alcohol ingestion. He was found to be hypoxic, unable to protect airway, and intubated.. The likely culprit was aspiration pneumonia/pneumonitis. He was managed with ventilatory support and antibiotics, with satisfactory ckinical response, and was successfully extubated on10/27/13. Respiratory status has remained stable since, and he is now saturating at 97%-100% on room air.  2. Hypovolemic/septic shock: This was evident at the time of admission, and was addressed with antibiotics, pressors and volume resuscitation. Pressors were finally weaned off on10/24/13. He did have Lactic acidosis likely secondary to shock and rhabdomyolysis, but this has since resolved.   3. Rhabdomyolysis/Acute renal failure: Patient was found to have elevated CK levels, consistent with rhabdomyolysis, possibly due to substance abuse, sepsis and shock. Rhabdomyolysis has resolved with iv fluids, but patient developed progressive renal failure, necessitating CVVH, then subsequently transitioned to intermittent dialysis. Perm cath placement was on 07/31/12. There appears to be no improvement in patient's renal status at this time. Managing per nephrology.  4. Elevated LFT's and INR: This was likely due to shock liver. Resolved as of 08/11/12.  5. C. Difficile infection: Patient developed diarrhea on 07/28/12, and C.Difficile PCR was positive. Managed with oral  Vancomycin. Now on day #15. As patient's stools have improved, and his last dose of Zosyn was on 08/29/12. Vancomycin was discontinued on 08/11/12. Repeat C. Difficile PCR is pending.  6. Hyperglycemia: Patient did have hyperglycemia in the initial days of hospitalization. This has resolved, and may have been secondary to stress of acute illness.  7. Acute encephalopathy: This was toxic/metabolic. EEG revealed findings consistent with mild encephalopathy, and brain MRI brain was negative for acute pathology. B12, Thiamine, ammonia, TSH, all within normal limits.HIV test and RPR were negative. Mental status is close to baseline at this time, although patient occasionally appears disoriented and forgetful.  8. Right upper extremity injury with compartment syndrome and possible vascular injury: Dr Aldean Baker was consulted for management, and performed surgery with shoulder release and fasciotomy of right arm on 07/22/12, followed by excision of biceps muscle right arm, neurolysis median nerve, secondary closure of triceps and biceps incision, with application of wound vac on 07/24/12. Arch aortogram in OR normal. No vascular deficit in RUE/shoulder. Managing per Dr. Lajoyce Corners and wound care. Wound vac has since been discontinued, and wound is healing well.   9. Oral thrush: An incidental finding on physical exam on 08/10/12. Commenced on a 7-day course of oral Diflucan, to be concluded on 08/16/12.  10. HTN: Patient's BP has been persistently elevated, since resolution of shock, in spite of hemodialysis. Adjusting Clonidine.      Code Status: Full code.  Family Communication:  Disposition Plan: To be determined. Per renal, will need to be inpatient for 6 weeks until declared ESRD.    Brief narrative: 21 years old male with PMH relevant for heroin addiction, depression, anxiety and panic attacks. He overdosed on heroin and alcohol on 07/21/12 and on 07/22/12, he was found unresponsive at about 2:00 pm. He was  brought to the ED by friends.  At admission he was unresponsive with good response to Narcan. He was hypotensive with MAP;s in the 40's despite aggressive IVF resuscitation. Required Narcan drip to stay awake. His Lactate at admission was 10 and he was hypothermic. Admitted to ICU and managed with fluids, pressors and antibiotics. On 07/24/12, he had fasciotomy and angiography by Dr Lajoyce Corners for right arm compartment syndrome with a right peri clavicular hematoma. Dr Darrick Penna placed a temporary right femoral HD catheter at that time, and patient was commenced on dialysis, for progressive ARF. He was transferred to [facility] service on 08/10/12.    Consultants:  Dr Aldean Baker, orthopedic surgeon.  Dr Jacquelynn Cree, VVS.   Dr Durene Cal, VVS.  Dr Zetta Bills, nephrologist.  Dr Onalee Hua, neurologist.     Procedures:  See hospital course.   Antibiotics:  Oral vancomycin  Cefazolin/Zosyn. Now completed.   HPI/Subjective: No new issues.   Objective: Vital signs in last 24 hours: Temp:  [98 F (36.7 C)-99.1 F (37.3 C)] 98 F (36.7 C) (11/10 1118) Pulse Rate:  [93-109] 93  (11/10 1118) Resp:  [16-18] 18  (11/10 1118) BP: (155-161)/(92-113) 156/101 mmHg (11/10 1118) SpO2:  [90 %-98 %] 96 % (11/10 1118) Weight:  [66.271 kg (146 lb 1.6 oz)] 66.271 kg (146 lb 1.6 oz) (11/10 0350) Weight change: -3.729 kg (-8 lb 3.6 oz) Last BM Date: 08/11/12  Intake/Output from previous day: 11/09 0701 - 11/10 0700 In: 960 [P.O.:960] Out: 7 [Urine:4; Stool:3]     Physical Exam: General: Somnolent, but easily rousable, and following simple commands. Communicative, not short of breath at rest.  HEENT:  Moderate clinical pallor, no jaundice, no conjunctival injection or discharge. Has oral thrush. NECK:  Supple, JVP not seen, no carotid bruits, no palpable lymphadenopathy, no palpable goiter. CHEST:  Clinically clear to auscultation, no wheezes, no crackles. HEART:  Sounds 1 and 2 heard,  normal, regular, no murmurs. ABDOMEN:  Flat, soft, non-tender, no palpable organomegaly, no palpable masses, normal bowel sounds. GENITALIA:  Not examined. UPPER EXTREMITIES: Well healing scar noted on right upper arm and shoulder.  LOWER EXTREMITIES:  No pitting edema, palpable peripheral pulses. MUSCULOSKELETAL SYSTEM:  Unremarkable. CENTRAL NERVOUS SYSTEM:  No focal neurologic deficit on gross examination.  Lab Results:  [facility] 08/12/12 0615 08/11/12 0645  WBC 13.3* 11.7*  HGB 8.5* 8.7*  HCT 25.7* 26.3*  PLT 603* 581*    Basename 08/12/12 0615 08/11/12 0645  NA 135 134*  K 4.0 4.3  CL 93* 91*  CO2 24 23  GLUCOSE 122* 113*  BUN 29* 17  CREATININE 6.54* 5.65*  CALCIUM 9.5 9.7   Recent Results (from the past 240 hour(s))  CULTURE, BLOOD (ROUTINE X 2)     Status: Normal   Collection Time   08/03/12  2:37 PM      Component Value Range Status Comment   Specimen Description BLOOD RIGHT HAND   Final    Special Requests BOTTLES DRAWN AEROBIC AND ANAEROBIC 10CC   Final    Culture  Setup Time 08/03/2012 21:42   Final    Culture NO GROWTH 5 DAYS   Final    Report Status 08/09/2012 FINAL   Final   CULTURE, BLOOD (ROUTINE X 2)     Status: Normal   Collection Time   08/03/12  3:51 PM      Component Value Range Status Comment   Specimen Description BLOOD ARM LEFT   Final    Special Requests BOTTLES DRAWN AEROBIC ONLY 10CC  Final    Culture  Setup Time 08/03/2012 21:42   Final    Culture NO GROWTH 5 DAYS   Final    Report Status 08/09/2012 FINAL   Final   CSF CULTURE     Status: Normal   Collection Time   08/05/12  5:50 PM      Component Value Range Status Comment   Specimen Description CSF   Final    Special Requests NONE   Final    Gram Stain     Final    Value: CYTOSPIN NO WBC SEEN     NO ORGANISMS SEEN   Culture NO GROWTH 3 DAYS   Final    Report Status 08/09/2012 FINAL   Final   AFB CULTURE WITH SMEAR     Status: Normal (Preliminary result)   Collection Time    08/05/12  5:50 PM      Component Value Range Status Comment   Specimen Description CSF   Final    Special Requests NONE   Final    ACID FAST SMEAR NOT DONE   Final    Culture     Final    Value: CULTURE WILL BE EXAMINED FOR 6 WEEKS BEFORE ISSUING A FINAL REPORT   Report Status PENDING   Incomplete   FUNGUS CULTURE W SMEAR     Status: Normal (Preliminary result)   Collection Time   08/05/12  5:50 PM      Component Value Range Status Comment   Specimen Description CSF   Final    Special Requests NONE   Final    Fungal Smear NO YEAST OR FUNGAL ELEMENTS SEEN   Final    Culture CULTURE IN PROGRESS FOR FOUR WEEKS   Final    Report Status PENDING   Incomplete   CULTURE, BLOOD (ROUTINE X 2)     Status: Normal (Preliminary result)   Collection Time   08/08/12  1:40 PM      Component Value Range Status Comment   Specimen Description BLOOD LEFT HAND   Final    Special Requests BOTTLES DRAWN AEROBIC ONLY 10CC   Final    Culture  Setup Time 08/08/2012 19:01   Final    Culture     Final    Value:        BLOOD CULTURE RECEIVED NO GROWTH TO DATE CULTURE WILL BE HELD FOR 5 DAYS BEFORE ISSUING A FINAL NEGATIVE REPORT   Report Status PENDING   Incomplete   CULTURE, BLOOD (ROUTINE X 2)     Status: Normal (Preliminary result)   Collection Time   08/08/12  1:50 PM      Component Value Range Status Comment   Specimen Description BLOOD RIGHT HAND   Final    Special Requests BOTTLES DRAWN AEROBIC ONLY 10CC   Final    Culture  Setup Time 08/08/2012 19:01   Final    Culture     Final    Value:        BLOOD CULTURE RECEIVED NO GROWTH TO DATE CULTURE WILL BE HELD FOR 5 DAYS BEFORE ISSUING A FINAL NEGATIVE REPORT   Report Status PENDING   Incomplete      Studies/Results: No results found.  Medications: Scheduled Meds:    . Chlorhexidine Gluconate Cloth  6 each Topical Q0600  . cloNIDine  0.1 mg Oral BID  . darbepoetin (ARANESP) injection - DIALYSIS  150 mcg Intravenous Q Sat-HD  . divalproex  250 mg  Oral Q12H  .  famotidine  20 mg Oral Daily  . feeding supplement  1 Container Oral TID BM  . ferric gluconate (FERRLECIT/NULECIT) IV  125 mg Intravenous Q M,W,F-HD  . fluconazole  100 mg Oral q1800  . heparin subcutaneous  5,000 Units Subcutaneous Q8H  . LORazepam  2 mg Oral BID  . methadone  2.5 mg Oral Q12H  . multivitamin  1 tablet Oral QHS  . saccharomyces boulardii  250 mg Oral BID  . silver sulfADIAZINE   Topical Daily  . thiamine  100 mg Oral Daily  . [DISCONTINUED] cloNIDine  0.1 mg Oral QHS  . [DISCONTINUED] vancomycin  500 mg Per Tube Q6H   Continuous Infusions:    . sodium chloride 20 mL/hr at 08/05/12 0645  . dextrose 5 % and 0.9% NaCl 10 mL/hr at 07/29/12 0914   PRN Meds:.acetaminophen (TYLENOL) oral liquid 160 mg/5 mL, haloperidol lactate, heparin, lidocaine, ondansetron, oxyCODONE, pentafluoroprop-tetrafluoroeth    LOS: 21 days   Elizaveta Mattice,CHRISTOPHER  [facility] Pager [phone]. If 8PM-8AM, please contact night-coverage at [email or web address], password [facility] 08/12/2012, 11:47 AM  LOS: 21 days

## 2012-08-12 NOTE — Progress Notes (Signed)
[facility] ROUNDING NOTE   Subjective:   Interval History:awake but somnolent and oriented  Objective:  Vital signs in last 24 hours:  Temp:  [98.8 F (37.1 C)-99.1 F (37.3 C)] 99.1 F (37.3 C) (11/10 0350) Pulse Rate:  [99-109] 99  (11/10 0706) Resp:  [16-18] 18  (11/10 0350) BP: (155-161)/(92-113) 161/106 mmHg (11/10 0951) SpO2:  [90 %-98 %] 98 % (11/10 0350) Weight:  [66.271 kg (146 lb 1.6 oz)] 66.271 kg (146 lb 1.6 oz) (11/10 0350)  Weight change: -3.729 kg (-8 lb 3.6 oz) Filed Weights   08/10/12 1703 08/11/12 0553 08/12/12 0350  Weight: 66.4 kg (146 lb 6.2 oz) 64.9 kg (143 lb 1.3 oz) 66.271 kg (146 lb 1.6 oz)    Intake/Output: I/O last 3 completed shifts: In: 1633 [P.O.:1633] Out: 7 [Urine:4; Stool:3]   Intake/Output this shift:     General - fatigued but awakes to stimulation  HEENT - NG T  CVS RRR  Chest - Tunneled HD catheter on left; Clear  Abd - soft, non tender  Ext - Rt upper arm wrapped in ACE, wound vac removed, 10 mc well healing surgical incision with good granulation tissue generalized edema of RUE; Good pulses all extremities; LUE without swelling  Neuro - improved mentation and behavior, responds appropriately to questions     Basic Metabolic Panel:  Lab 08/12/12 4098 08/11/12 0645 08/10/12 1211 08/08/12 0904 08/06/12 0500  NA 135 134* 133* 135 138  K 4.0 4.3 3.8 4.3 5.2*  CL 93* 91* 92* 93* 100  CO2 24 23 25 23 20   GLUCOSE 122* 113* 112* 88 92  BUN 29* 17 39* 57* 74*  CREATININE 6.54* 5.65* 10.12* 12.01* 14.61*  CALCIUM 9.5 9.7 8.9 -- --  MG -- -- -- -- --  PHOS 6.7* -- 7.1* 8.2* 7.9*    Liver Function Tests:  Lab 08/12/12 0615 08/11/12 0645 08/10/12 1211 08/08/12 0904 08/06/12 0500  AST -- 41* -- -- 32  ALT -- 18 -- -- 12  ALKPHOS -- 99 -- -- 81  BILITOT -- 0.4 -- -- 0.4  PROT -- 7.7 -- -- 5.8*  ALBUMIN 3.5 3.7 3.1* 2.7* 2.8*   No results found for this basename: LIPASE:5,AMYLASE:5 in the last 168 hours No  results found for this basename: AMMONIA:3 in the last 168 hours  CBC:  Lab 08/12/12 0615 08/11/12 0645 08/10/12 1205 08/08/12 0904 08/06/12 0500  WBC 13.3* 11.7* 9.6 9.9 13.1*  NEUTROABS -- -- -- -- 9.6*  HGB 8.5* 8.7* 7.8* 7.0* 7.5*  HCT 25.7* 26.3* 23.5* 20.7* 22.4*  MCV 85.7 85.9 85.1 85.2 85.5  PLT 603* 581* 549* 497* 467*    Cardiac Enzymes: No results found for this basename: CKTOTAL:5,CKMB:5,CKMBINDEX:5,TROPONINI:5 in the last 168 hours  BNP: No components found with this basename: POCBNP:5  CBG:  Lab 08/06/12 2027 08/06/12 1608 08/06/12 1118 08/06/12 0821 08/06/12 0416  GLUCAP 97 100* 87 94 87    Microbiology: Results for orders placed during the hospital encounter of 07/22/12  URINE CULTURE     Status: Normal   Collection Time   07/22/12  4:32 PM      Component Value Range Status Comment   Specimen Description URINE, CATHETERIZED   Final    Special Requests NONE   Final    Culture  Setup Time 07/23/2012 11:13   Final    Colony Count NO GROWTH   Final    Culture NO GROWTH   Final    Report  Status 07/24/2012 FINAL   Final   CULTURE, BLOOD (ROUTINE X 2)     Status: Normal   Collection Time   07/22/12  6:42 PM      Component Value Range Status Comment   Specimen Description BLOOD LEFT ARM   Final    Special Requests BOTTLES DRAWN AEROBIC ONLY   Final    Culture  Setup Time 07/23/2012 10:16   Final    Culture NO GROWTH 5 DAYS   Final    Report Status 07/29/2012 FINAL   Final   CULTURE, BLOOD (ROUTINE X 2)     Status: Normal   Collection Time   07/22/12  6:51 PM      Component Value Range Status Comment   Specimen Description BLOOD RIGHT ARM   Final    Special Requests BOTTLES DRAWN AEROBIC ONLY   Final    Culture  Setup Time 07/23/2012 10:16   Final    Culture NO GROWTH 5 DAYS   Final    Report Status 07/29/2012 FINAL   Final   MRSA PCR SCREENING     Status: Normal   Collection Time   07/23/12 12:30 AM      Component Value Range Status Comment     MRSA by PCR NEGATIVE  NEGATIVE Final   CULTURE, BLOOD (ROUTINE X 2)     Status: Normal   Collection Time   07/25/12  8:30 AM      Component Value Range Status Comment   Specimen Description BLOOD LEFT HAND   Final    Special Requests BOTTLES DRAWN AEROBIC AND ANAEROBIC 10CC   Final    Culture  Setup Time 07/25/2012 13:56   Final    Culture NO GROWTH 5 DAYS   Final    Report Status 07/31/2012 FINAL   Final   CULTURE, BLOOD (ROUTINE X 2)     Status: Normal   Collection Time   07/25/12  8:40 AM      Component Value Range Status Comment   Specimen Description BLOOD RIGHT HAND   Final    Special Requests BOTTLES DRAWN AEROBIC AND ANAEROBIC 10CC   Final    Culture  Setup Time 07/25/2012 13:56   Final    Culture NO GROWTH 5 DAYS   Final    Report Status 07/31/2012 FINAL   Final   URINE CULTURE     Status: Normal   Collection Time   07/25/12  8:49 AM      Component Value Range Status Comment   Specimen Description URINE, CATHETERIZED   Final    Special Requests NONE   Final    Culture  Setup Time 07/25/2012 15:30   Final    Colony Count NO GROWTH   Final    Culture NO GROWTH   Final    Report Status 07/26/2012 FINAL   Final   CLOSTRIDIUM DIFFICILE BY PCR     Status: Abnormal   Collection Time   07/28/12  6:15 AM      Component Value Range Status Comment   C difficile by pcr POSITIVE (*) NEGATIVE Final   CULTURE, BLOOD (ROUTINE X 2)     Status: Normal   Collection Time   08/03/12  2:37 PM      Component Value Range Status Comment   Specimen Description BLOOD RIGHT HAND   Final    Special Requests BOTTLES DRAWN AEROBIC AND ANAEROBIC 10CC   Final    Culture  Setup Time  08/03/2012 21:42   Final    Culture NO GROWTH 5 DAYS   Final    Report Status 08/09/2012 FINAL   Final   CULTURE, BLOOD (ROUTINE X 2)     Status: Normal   Collection Time   08/03/12  3:51 PM      Component Value Range Status Comment   Specimen Description BLOOD ARM LEFT   Final    Special Requests BOTTLES DRAWN  AEROBIC ONLY 10CC   Final    Culture  Setup Time 08/03/2012 21:42   Final    Culture NO GROWTH 5 DAYS   Final    Report Status 08/09/2012 FINAL   Final   CSF CULTURE     Status: Normal   Collection Time   08/05/12  5:50 PM      Component Value Range Status Comment   Specimen Description CSF   Final    Special Requests NONE   Final    Gram Stain     Final    Value: CYTOSPIN NO WBC SEEN     NO ORGANISMS SEEN   Culture NO GROWTH 3 DAYS   Final    Report Status 08/09/2012 FINAL   Final   AFB CULTURE WITH SMEAR     Status: Normal (Preliminary result)   Collection Time   08/05/12  5:50 PM      Component Value Range Status Comment   Specimen Description CSF   Final    Special Requests NONE   Final    ACID FAST SMEAR NOT DONE   Final    Culture     Final    Value: CULTURE WILL BE EXAMINED FOR 6 WEEKS BEFORE ISSUING A FINAL REPORT   Report Status PENDING   Incomplete   FUNGUS CULTURE W SMEAR     Status: Normal (Preliminary result)   Collection Time   08/05/12  5:50 PM      Component Value Range Status Comment   Specimen Description CSF   Final    Special Requests NONE   Final    Fungal Smear NO YEAST OR FUNGAL ELEMENTS SEEN   Final    Culture CULTURE IN PROGRESS FOR FOUR WEEKS   Final    Report Status PENDING   Incomplete   CULTURE, BLOOD (ROUTINE X 2)     Status: Normal (Preliminary result)   Collection Time   08/08/12  1:40 PM      Component Value Range Status Comment   Specimen Description BLOOD LEFT HAND   Final    Special Requests BOTTLES DRAWN AEROBIC ONLY 10CC   Final    Culture  Setup Time 08/08/2012 19:01   Final    Culture     Final    Value:        BLOOD CULTURE RECEIVED NO GROWTH TO DATE CULTURE WILL BE HELD FOR 5 DAYS BEFORE ISSUING A FINAL NEGATIVE REPORT   Report Status PENDING   Incomplete   CULTURE, BLOOD (ROUTINE X 2)     Status: Normal (Preliminary result)   Collection Time   08/08/12  1:50 PM      Component Value Range Status Comment   Specimen Description BLOOD  RIGHT HAND   Final    Special Requests BOTTLES DRAWN AEROBIC ONLY 10CC   Final    Culture  Setup Time 08/08/2012 19:01   Final    Culture     Final    Value:        BLOOD  CULTURE RECEIVED NO GROWTH TO DATE CULTURE WILL BE HELD FOR 5 DAYS BEFORE ISSUING A FINAL NEGATIVE REPORT   Report Status PENDING   Incomplete     Coagulation Studies: No results found for this basename: LABPROT:5,INR:5 in the last 72 hours  Urinalysis: No results found for this basename: COLORURINE:2,APPERANCEUR:2,LABSPEC:2,PHURINE:2,GLUCOSEU:2,HGBUR:2,BILIRUBINUR:2,KETONESUR:2,PROTEINUR:2,UROBILINOGEN:2,NITRITE:2,LEUKOCYTESUR:2 in the last 72 hours    Imaging: No results found.   Medications:      . sodium chloride 20 mL/hr at 08/05/12 0645  . dextrose 5 % and 0.9% NaCl 10 mL/hr at 07/29/12 0914      . Chlorhexidine Gluconate Cloth  6 each Topical Q0600  . cloNIDine  0.1 mg Oral BID  . darbepoetin (ARANESP) injection - DIALYSIS  150 mcg Intravenous Q Sat-HD  . divalproex  250 mg Oral Q12H  . famotidine  20 mg Oral Daily  . feeding supplement  1 Container Oral TID BM  . ferric gluconate (FERRLECIT/NULECIT) IV  125 mg Intravenous Q M,W,F-HD  . fluconazole  100 mg Oral q1800  . heparin subcutaneous  5,000 Units Subcutaneous Q8H  . LORazepam  2 mg Oral BID  . methadone  2.5 mg Oral Q12H  . multivitamin  1 tablet Oral QHS  . saccharomyces boulardii  250 mg Oral BID  . silver sulfADIAZINE   Topical Daily  . thiamine  100 mg Oral Daily  . [DISCONTINUED] cloNIDine  0.1 mg Oral QHS  . [DISCONTINUED] vancomycin  500 mg Per Tube Q6H   acetaminophen (TYLENOL) oral liquid 160 mg/5 mL, haloperidol lactate, heparin, lidocaine, ondansetron, oxyCODONE, pentafluoroprop-tetrafluoroeth  Assessment/ Plan:  pt adm with heroin and alcohol overdose, R shd /arm compartment syndrome and resulting rhabdo  10/20- Admitted to ICU  10/20 - Surgery with shoulder release and fasciotomy of right arm  10/21 - Started on  CVVH>>d/c 10/22  10/22 - Return to OR for I&D with wound vac placement. Bicep excised  10/25 - Vac change  10/26 - diarrhea>>C diff positive  10/27 - extubated  10/27 - agitation, precedex started  10/29 - HD cath placement  Acute Renal Failure secondary to ATN and Rhabdomyolysis. There appears to be no sign of recovery.receiving scheduled dialysis Monday   Anemia  ESA   Bones follow calcium and phosphorus  Dilaysis dependent since admission 10/20  . Will need to be inpatient for 6 weeks until declared ESRD      LOS: 21 Chosen Geske W @TODAY @9 :59 AM

## 2012-08-13 ENCOUNTER — Inpatient Hospital Stay (HOSPITAL_COMMUNITY): Payer: BC Managed Care – PPO

## 2012-08-13 LAB — RENAL FUNCTION PANEL
Albumin: 3.5 g/dL (ref 3.5–5.2)
BUN: 36 mg/dL — ABNORMAL HIGH (ref 6–23)
Chloride: 90 mEq/L — ABNORMAL LOW (ref 96–112)
Creatinine, Ser: 5.99 mg/dL — ABNORMAL HIGH (ref 0.50–1.35)
Phosphorus: 5.2 mg/dL — ABNORMAL HIGH (ref 2.3–4.6)

## 2012-08-13 LAB — CBC
MCH: 28 pg (ref 26.0–34.0)
MCV: 84.4 fL (ref 78.0–100.0)
Platelets: 647 10*3/uL — ABNORMAL HIGH (ref 150–400)
RBC: 2.89 MIL/uL — ABNORMAL LOW (ref 4.22–5.81)

## 2012-08-13 MED ORDER — LORAZEPAM 2 MG/ML IJ SOLN
INTRAMUSCULAR | Status: AC
  Filled 2012-08-13: qty 1

## 2012-08-13 MED ORDER — LORAZEPAM 2 MG/ML IJ SOLN
1.0000 mg | Freq: Once | INTRAMUSCULAR | Status: AC
  Administered 2012-08-13: 1 mg via INTRAVENOUS

## 2012-08-13 MED ORDER — LORAZEPAM 2 MG/ML IJ SOLN
1.0000 mg | INTRAMUSCULAR | Status: DC | PRN
  Administered 2012-08-14 – 2012-08-18 (×4): 1 mg via INTRAVENOUS
  Filled 2012-08-13 (×4): qty 1

## 2012-08-13 MED ORDER — DARBEPOETIN ALFA-POLYSORBATE 150 MCG/0.3ML IJ SOLN
INTRAMUSCULAR | Status: AC
  Administered 2012-08-13: 150 ug via INTRAVENOUS
  Filled 2012-08-13: qty 0.3

## 2012-08-13 MED ORDER — ONDANSETRON HCL 4 MG/2ML IJ SOLN
INTRAMUSCULAR | Status: AC
  Administered 2012-08-13: 4 mg via INTRAVENOUS
  Filled 2012-08-13: qty 2

## 2012-08-13 MED ORDER — OXYCODONE HCL 5 MG PO TABS
ORAL_TABLET | ORAL | Status: AC
  Administered 2012-08-13: 5 mg via ORAL
  Filled 2012-08-13: qty 1

## 2012-08-13 MED ORDER — ACETAMINOPHEN 325 MG PO TABS
ORAL_TABLET | ORAL | Status: AC
  Filled 2012-08-13: qty 2

## 2012-08-13 MED ORDER — CLONIDINE HCL 0.2 MG PO TABS
0.2000 mg | ORAL_TABLET | Freq: Three times a day (TID) | ORAL | Status: DC
  Administered 2012-08-13 – 2012-08-20 (×21): 0.2 mg via ORAL
  Filled 2012-08-13 (×23): qty 1

## 2012-08-13 NOTE — Progress Notes (Signed)
OT Cancellation Note  Patient Details Name: Martin Chapman MRN: [id number] DOB: October 27, 1990   Cancelled Treatment:     Pt resting comfortably sleeping after agitation earlier requiring medication.  Unable to participate in OT.  Will continue to follow.  Evern Bio 08/13/2012, 2:17 PM

## 2012-08-13 NOTE — Progress Notes (Signed)
Patient ID: Martin Chapman, male   DOB: January 30, 1991, 21 y.o.   MRN: [id number]  [facility] Progress Note    Subjective:   The patient is lethargic but arousable.  Not cooperating with exam or interview.  Had a screaming fit prior to my arrival, demanding [employer] food.   Objective:   BP 162/114  Pulse 111  Temp 97.9 F (36.6 C) (Oral)  Resp 22  Ht 5\' 7"  (1.702 m)  Wt 62.1 kg (136 lb 14.5 oz)  BMI 21.44 kg/m2  SpO2 95%  Physical Exam: Gen:WD WN WM lying face down resting comfortable CVS:RRR Resp:CTA GEX:BMWUXL Ext:no edema, Left arm bandaged.  Labs: BMET  Lab 08/13/12 0650 08/12/12 0615 08/11/12 0645 08/10/12 1211 08/08/12 0904  NA 132* 135 134* 133* 135  K 3.9 4.0 4.3 3.8 4.3  CL 90* 93* 91* 92* 93*  CO2 24 24 23 25 23   GLUCOSE 108* 122* 113* 112* 88  BUN 36* 29* 17 39* 57*  CREATININE 5.99* 6.54* 5.65* 10.12* 12.01*  ALBUMIN 3.5 3.5 3.7 3.1* 2.7*  CALCIUM 9.4 9.5 9.7 8.9 8.4  PHOS 5.2* 6.7* -- 7.1* 8.2*   CBC  Lab 08/13/12 0650 08/12/12 0615 08/11/12 0645 08/10/12 1205  WBC 15.4* 13.3* 11.7* 9.6  NEUTROABS -- -- -- --  HGB 8.1* 8.5* 8.7* 7.8*  HCT 24.4* 25.7* 26.3* 23.5*  MCV 84.4 85.7 85.9 85.1  PLT 647* 603* 581* 549*    @IMGRELPRIORS @ Medications:      . Chlorhexidine Gluconate Cloth  6 each Topical Q0600  . cloNIDine  0.1 mg Oral TID  . darbepoetin (ARANESP) injection - DIALYSIS  150 mcg Intravenous Q Mon-HD  . divalproex  250 mg Oral Q12H  . famotidine  20 mg Oral Daily  . feeding supplement  1 Container Oral TID BM  . ferric gluconate (FERRLECIT/NULECIT) IV  125 mg Intravenous Q M,W,F-HD  . fluconazole  100 mg Oral q1800  . heparin subcutaneous  5,000 Units Subcutaneous Q8H  . LORazepam      . [COMPLETED] LORazepam  1 mg Intravenous Once  . LORazepam  2 mg Oral BID  . methadone  2.5 mg Oral Q12H  . multivitamin  1 tablet Oral QHS  . saccharomyces boulardii  250 mg Oral BID  . silver sulfADIAZINE   Topical Daily  .  thiamine  100 mg Oral Daily  . [DISCONTINUED] darbepoetin (ARANESP) injection - DIALYSIS  150 mcg Intravenous Q Sat-HD     Assessment/ Plan:   1. AMS- ?anoxic brain injury or psychiatric illness.  Would recommend Psych eval for drug rehab and also consider Neuro eval due to his AMS and impulse control issue to explore if this is related to anoxic brain injury 2. 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  Remains anuric. 3. 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 Nutrition:not eating 6. Hypertension:stable 7. Disposition- difficult situation as he has no recovery of renal function and is 20 days out from inititiating HD on 07/23/12.  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  Will need to discuss further with SW and Case management about options.  [name] 08/13/2012, 12:55 PM

## 2012-08-13 NOTE — Progress Notes (Signed)
[facility] PROGRESS NOTE  Martin Chapman:[id number] DOB: 12-Dec-1990 DOA: 07/22/2012 PCP: No primary provider on file.  Assessment/Plan: Principal Problem:  *Drug overdose, multiple drugs Active Problems:  Acute renal failure  Compartment syndrome of upper extremity  Rhabdomyolysis  Shock liver  Aspiration into lower respiratory tract  Postoperative respiratory failure  C. difficile colitis  Encephalopathy  Anemia    1. Acute hypoxic respiratory failure:  Patient presented with altered mental status, secondary to narcotic overdose and alcohol ingestion. He was found to be hypoxic, unable to protect airway, and intubated.. The likely culprit was aspiration pneumonia/pneumonitis. He was managed with ventilatory support and antibiotics, with satisfactory ckinical response, and was successfully extubated on10/27/13. Respiratory status has remained stable since, and he is now saturating at 97%-100% on room air.  2. Hypovolemic/septic shock: This was evident at the time of admission, and was addressed with antibiotics, pressors and volume resuscitation. Pressors were finally weaned off on10/24/13. He did have Lactic acidosis likely secondary to shock and rhabdomyolysis, but this has since resolved.   3. Rhabdomyolysis/Acute renal failure: Patient was found to have elevated CK levels, consistent with rhabdomyolysis, possibly due to substance abuse, sepsis and shock. Rhabdomyolysis has resolved with iv fluids, but patient developed progressive renal failure, necessitating CVVH, then subsequently transitioned to intermittent dialysis. Perm cath placement was on 07/31/12. There appears to be no improvement in patient's renal status at this time. Managing per nephrology.  4. Elevated LFT's and INR: This was likely due to shock liver. Resolved as of 08/11/12.  5. C. Difficile infection: Patient developed diarrhea on 07/28/12, and C.Difficile PCR was positive. Managed with oral  Vancomycin. Now on day #15. As patient's stools have improved, and his last dose of Zosyn was on 08/29/12. Vancomycin was discontinued on 08/11/12. Repeat C. Difficile PCR is pending. Diarrhea has resolved.  6. Hyperglycemia: Patient did have hyperglycemia in the initial days of hospitalization. This has resolved, and may have been secondary to stress of acute illness.  7. Acute encephalopathy: This was toxic/metabolic. There may also be an element of anoxic brain damage. EEG revealed findings consistent with mild encephalopathy, and brain MRI brain was negative for acute pathology. B12, Thiamine, ammonia, TSH, all within normal limits.HIV test and RPR were negative. Mental status is close to new baseline at this time, although patient occasionally appears disoriented and forgetful. He has had some periods of agitation today, requiring 1:1 Sitter and restraints. Will consult psychiatrist. 8. Right upper extremity injury with compartment syndrome and possible vascular injury: Dr Aldean Baker was consulted for management, and performed surgery with shoulder release and fasciotomy of right arm on 07/22/12, followed by excision of biceps muscle right arm, neurolysis median nerve, secondary closure of triceps and biceps incision, with application of wound vac on 07/24/12. Arch aortogram in OR normal. No vascular deficit in RUE/shoulder. Managing per Dr. Lajoyce Corners and wound care. Wound vac has since been discontinued, and wound is healing well.   9. Oral thrush: An incidental finding on physical exam on 08/10/12. Commenced on a 7-day course of oral Diflucan, to be concluded on 08/16/12.  10. HTN: Patient's BP has been persistently elevated, since resolution of shock, in spite of hemodialysis. Adjusting Clonidine as indicated.      Code Status: Full code.  Family Communication:  Disposition Plan: To be determined. Per renal, will need to be inpatient for 6 weeks until declared ESRD.    Brief narrative: 21 years  old male with PMH relevant for heroin addiction, depression,  anxiety and panic attacks. He overdosed on heroin and alcohol on 07/21/12 and on 07/22/12, he was found unresponsive at about 2:00 pm. He was brought to the ED by friends. At admission he was unresponsive with good response to Narcan. He was hypotensive with MAP;s in the 40's despite aggressive IVF resuscitation. Required Narcan drip to stay awake. His Lactate at admission was 10 and he was hypothermic. Admitted to ICU and managed with fluids, pressors and antibiotics. On 07/24/12, he had fasciotomy and angiography by Dr Lajoyce Corners for right arm compartment syndrome with a right peri clavicular hematoma. Dr Darrick Penna placed a temporary right femoral HD catheter at that time, and patient was commenced on dialysis, for progressive ARF. He was transferred to [facility] service on 08/10/12.    Consultants:  Dr Aldean Baker, orthopedic surgeon.  Dr Jacquelynn Cree, VVS.   Dr Durene Cal, VVS.  Dr Zetta Bills, nephrologist.  Dr Onalee Hua, neurologist.     Procedures:  See hospital course.   Antibiotics:  Oral vancomycin  Cefazolin/Zosyn. Now completed.   HPI/Subjective: Combative/agitated today.   Objective: Vital signs in last 24 hours: Temp:  [97.4 F (36.3 C)-100.7 F (38.2 C)] 97.9 F (36.6 C) (11/11 1215) Pulse Rate:  [92-118] 111  (11/11 1215) Resp:  [15-22] 22  (11/11 1215) BP: (137-171)/(85-114) 162/114 mmHg (11/11 1215) SpO2:  [95 %-99 %] 95 % (11/11 1215) Weight:  [62.1 kg (136 lb 14.5 oz)-65.8 kg (145 lb 1 oz)] 62.1 kg (136 lb 14.5 oz) (11/11 1051) Weight change: -0.471 kg (-1 lb 0.6 oz) Last BM Date: 08/11/12  Intake/Output from previous day: 11/10 0701 - 11/11 0700 In: 480 [P.O.:480] Out: -  Total I/O In: 480 [P.O.:480] Out: 3100 [Other:3100]   Physical Exam: General: Agitated, attempting to climb out of bed. Needed sitter, during HD.  HEENT:  Moderate clinical pallor, no jaundice, no conjunctival  injection or discharge. Has oral thrush. NECK:  Supple, JVP not seen, no carotid bruits, no palpable lymphadenopathy, no palpable goiter. CHEST:  Clinically clear to auscultation, no wheezes, no crackles. HEART:  Sounds 1 and 2 heard, normal, regular, no murmurs. ABDOMEN:  Flat, soft, non-tender, no palpable organomegaly, no palpable masses, normal bowel sounds. GENITALIA:  Not examined. UPPER EXTREMITIES: Well healing scar noted on right upper arm and shoulder.  LOWER EXTREMITIES:  No pitting edema, palpable peripheral pulses. MUSCULOSKELETAL SYSTEM:  Unremarkable. CENTRAL NERVOUS SYSTEM:  No focal neurologic deficit on gross examination.  Lab Results:  [facility] 08/13/12 0650 08/12/12 0615  WBC 15.4* 13.3*  HGB 8.1* 8.5*  HCT 24.4* 25.7*  PLT 647* 603*    Basename 08/13/12 0650 08/12/12 0615  NA 132* 135  K 3.9 4.0  CL 90* 93*  CO2 24 24  GLUCOSE 108* 122*  BUN 36* 29*  CREATININE 5.99* 6.54*  CALCIUM 9.4 9.5   Recent Results (from the past 240 hour(s))  CULTURE, BLOOD (ROUTINE X 2)     Status: Normal   Collection Time   08/03/12  2:37 PM      Component Value Range Status Comment   Specimen Description BLOOD RIGHT HAND   Final    Special Requests BOTTLES DRAWN AEROBIC AND ANAEROBIC 10CC   Final    Culture  Setup Time 08/03/2012 21:42   Final    Culture NO GROWTH 5 DAYS   Final    Report Status 08/09/2012 FINAL   Final   CULTURE, BLOOD (ROUTINE X 2)     Status: Normal   Collection Time  08/03/12  3:51 PM      Component Value Range Status Comment   Specimen Description BLOOD ARM LEFT   Final    Special Requests BOTTLES DRAWN AEROBIC ONLY 10CC   Final    Culture  Setup Time 08/03/2012 21:42   Final    Culture NO GROWTH 5 DAYS   Final    Report Status 08/09/2012 FINAL   Final   CSF CULTURE     Status: Normal   Collection Time   08/05/12  5:50 PM      Component Value Range Status Comment   Specimen Description CSF   Final    Special Requests NONE   Final    Gram Stain      Final    Value: CYTOSPIN NO WBC SEEN     NO ORGANISMS SEEN   Culture NO GROWTH 3 DAYS   Final    Report Status 08/09/2012 FINAL   Final   AFB CULTURE WITH SMEAR     Status: Normal (Preliminary result)   Collection Time   08/05/12  5:50 PM      Component Value Range Status Comment   Specimen Description CSF   Final    Special Requests NONE   Final    ACID FAST SMEAR NOT DONE   Final    Culture     Final    Value: CULTURE WILL BE EXAMINED FOR 6 WEEKS BEFORE ISSUING A FINAL REPORT   Report Status PENDING   Incomplete   FUNGUS CULTURE W SMEAR     Status: Normal (Preliminary result)   Collection Time   08/05/12  5:50 PM      Component Value Range Status Comment   Specimen Description CSF   Final    Special Requests NONE   Final    Fungal Smear NO YEAST OR FUNGAL ELEMENTS SEEN   Final    Culture CULTURE IN PROGRESS FOR FOUR WEEKS   Final    Report Status PENDING   Incomplete   CULTURE, BLOOD (ROUTINE X 2)     Status: Normal (Preliminary result)   Collection Time   08/08/12  1:40 PM      Component Value Range Status Comment   Specimen Description BLOOD LEFT HAND   Final    Special Requests BOTTLES DRAWN AEROBIC ONLY 10CC   Final    Culture  Setup Time 08/08/2012 19:01   Final    Culture     Final    Value:        BLOOD CULTURE RECEIVED NO GROWTH TO DATE CULTURE WILL BE HELD FOR 5 DAYS BEFORE ISSUING A FINAL NEGATIVE REPORT   Report Status PENDING   Incomplete   CULTURE, BLOOD (ROUTINE X 2)     Status: Normal (Preliminary result)   Collection Time   08/08/12  1:50 PM      Component Value Range Status Comment   Specimen Description BLOOD RIGHT HAND   Final    Special Requests BOTTLES DRAWN AEROBIC ONLY 10CC   Final    Culture  Setup Time 08/08/2012 19:01   Final    Culture     Final    Value:        BLOOD CULTURE RECEIVED NO GROWTH TO DATE CULTURE WILL BE HELD FOR 5 DAYS BEFORE ISSUING A FINAL NEGATIVE REPORT   Report Status PENDING   Incomplete      Studies/Results: No results  found.  Medications: Scheduled Meds:    . Chlorhexidine  Gluconate Cloth  6 each Topical [id number]  . cloNIDine  0.1 mg Oral TID  . darbepoetin (ARANESP) injection - DIALYSIS  150 mcg Intravenous Q Mon-HD  . divalproex  250 mg Oral Q12H  . famotidine  20 mg Oral Daily  . feeding supplement  1 Container Oral TID BM  . ferric gluconate (FERRLECIT/NULECIT) IV  125 mg Intravenous Q M,W,F-HD  . fluconazole  100 mg Oral q1800  . heparin subcutaneous  5,000 Units Subcutaneous Q8H  . LORazepam      . [COMPLETED] LORazepam  1 mg Intravenous Once  . LORazepam  2 mg Oral BID  . methadone  2.5 mg Oral Q12H  . multivitamin  1 tablet Oral QHS  . saccharomyces boulardii  250 mg Oral BID  . silver sulfADIAZINE   Topical Daily  . thiamine  100 mg Oral Daily  . [DISCONTINUED] darbepoetin (ARANESP) injection - DIALYSIS  150 mcg Intravenous Q Sat-HD   Continuous Infusions:    . sodium chloride 20 mL/hr at 08/05/12 0645  . dextrose 5 % and 0.9% NaCl 10 mL/hr at 07/29/12 0914   PRN Meds:.acetaminophen (TYLENOL) oral liquid 160 mg/5 mL, haloperidol lactate, heparin, lidocaine, ondansetron, oxyCODONE, pentafluoroprop-tetrafluoroeth    LOS: 22 days   Isom Kochan,CHRISTOPHER  [facility] Pager [phone]. If 8PM-8AM, please contact night-coverage at [email or web address], password [facility] 08/13/2012, 2:05 PM  LOS: 22 days

## 2012-08-13 NOTE — Progress Notes (Signed)
MD. Was call,pt. Has been agitated,anxious and restless,orders for one time ativan IV was given by MD.keep monitoring pt. Closely.

## 2012-08-13 NOTE — Progress Notes (Signed)
PT Cancellation Note  Patient Details Name: Martin Chapman MRN: [id number] DOB: April 01, 1991   Cancelled Treatment:    Reason Eval/Treat Not Completed: Other (comment) (Pt sleeping soundly after a profound episode of agitation)  Conferred with Doree Fudge, RN who agrees with holding PT at this time.   Van Clines [facility] 08/13/2012, 4:32 PM

## 2012-08-13 NOTE — Progress Notes (Signed)
Nutrition Follow-up  Intervention:    RD to add snacks for variable PO intake; recommend maximum encouragement given mental status  RD to continue to follow nutrition care plan  Assessment:   Per renal, HD dependent since 10/20. Will need to be inpatient for 6 weeks until declared ESRD.  Renal restrictions added back to diet order on 11/7 per MD. Phosphorus trending down, however remains elevated at this time. Intake of meals remains approximately 50%. This RD visited patient in HD, noted that pt was screaming and confused. Unable to obtain information from patient. Per chart, pt taking Breeze some of the time.  Diet Order: Renal 80/90-2-2 with 1200 ml fluid restriction Supplements: Resource Breeze PO TID  Meds: Scheduled Meds:    . Chlorhexidine Gluconate Cloth  6 each Topical Q0600  . cloNIDine  0.1 mg Oral TID  . darbepoetin (ARANESP) injection - DIALYSIS  150 mcg Intravenous Q Mon-HD  . divalproex  250 mg Oral Q12H  . famotidine  20 mg Oral Daily  . feeding supplement  1 Container Oral TID BM  . ferric gluconate (FERRLECIT/NULECIT) IV  125 mg Intravenous Q M,W,F-HD  . fluconazole  100 mg Oral q1800  . heparin subcutaneous  5,000 Units Subcutaneous Q8H  . LORazepam  2 mg Oral BID  . methadone  2.5 mg Oral Q12H  . multivitamin  1 tablet Oral QHS  . saccharomyces boulardii  250 mg Oral BID  . silver sulfADIAZINE   Topical Daily  . thiamine  100 mg Oral Daily  . [DISCONTINUED] cloNIDine  0.1 mg Oral BID  . [DISCONTINUED] darbepoetin (ARANESP) injection - DIALYSIS  150 mcg Intravenous Q Sat-HD   Continuous Infusions:    . sodium chloride 20 mL/hr at 08/05/12 0645  . dextrose 5 % and 0.9% NaCl 10 mL/hr at 07/29/12 0914   PRN Meds:.acetaminophen (TYLENOL) oral liquid 160 mg/5 mL, haloperidol lactate, heparin, lidocaine, ondansetron, oxyCODONE, pentafluoroprop-tetrafluoroeth  Labs:  CMP     Component Value Date/Time   NA 132* 08/13/2012 0650   K 3.9 08/13/2012 0650   CL  90* 08/13/2012 0650   CO2 24 08/13/2012 0650   GLUCOSE 108* 08/13/2012 0650   BUN 36* 08/13/2012 0650   CREATININE 5.99* 08/13/2012 0650   CALCIUM 9.4 08/13/2012 0650   PROT 7.7 08/11/2012 0645   ALBUMIN 3.5 08/13/2012 0650   AST 41* 08/11/2012 0645   ALT 18 08/11/2012 0645   ALKPHOS 99 08/11/2012 0645   BILITOT 0.4 08/11/2012 0645   GFRNONAA 12* 08/13/2012 0650   GFRAA 14* 08/13/2012 0650    Sodium  Date/Time Value Range Status  08/13/2012  6:50 AM 132* 135 - 145 mEq/L Final  08/12/2012  6:15 AM 135  135 - 145 mEq/L Final  08/11/2012  6:45 AM 134* 135 - 145 mEq/L Final    Potassium  Date/Time Value Range Status  08/13/2012  6:50 AM 3.9  3.5 - 5.1 mEq/L Final  08/12/2012  6:15 AM 4.0  3.5 - 5.1 mEq/L Final  08/11/2012  6:45 AM 4.3  3.5 - 5.1 mEq/L Final    Phosphorus  Date/Time Value Range Status  08/13/2012  6:50 AM 5.2* 2.3 - 4.6 mg/dL Final  45/40/9811  9:14 AM 6.7* 2.3 - 4.6 mg/dL Final  78/11/9560 13:08 PM 7.1* 2.3 - 4.6 mg/dL Final    Magnesium  Date/Time Value Range Status  07/31/2012  5:00 AM 2.3  1.5 - 2.5 mg/dL Final  65/78/4696  2:95 AM 2.5  1.5 - 2.5 mg/dL Final  07/26/2012  7:43 AM 2.7* 1.5 - 2.5 mg/dL Final    CBG (last 3)  No results found for this basename: GLUCAP:3 in the last 72 hours   Intake/Output Summary (Last 24 hours) at 08/13/12 1012 Last data filed at 08/12/12 2115  Gross per 24 hour  Intake    480 ml  Output      0 ml  Net    480 ml  BM: 11/9  Weight Status:  65 kg s/p HD on 11/9 down from 81.2 kg (11/1) with negative fluid status from HD  Estimated needs:  2250-2500 kcals, 95-115 gm protein, ~1.2 liters fluid daily  Nutrition Dx:  Inadequate oral intake now r/t variable attention AEB variable meal completion.  Goal:  Intake to meet >90% of estimated nutrition needs, unmet.  Monitor:  PO intake, weight trend, labs, I/O, need for education  Jarold Motto MS, RD, LDN Pager: [phone] After-hours pager: [phone]

## 2012-08-13 NOTE — Progress Notes (Addendum)
145p-320pm: Patient found by NT in room up standing beside the bed naked, after having an incontinent episode. NT assisted patient back to bed and performed linen change and bath. 2 RNs (T. Jerauld Bostwick and Augustina Mood) at patient bedside for approx. 1hr 20 minutes to ensure patient safety and assist in keeping patient in the bed. Patient agitated. Requested Recruitment consultant for next shift. Also contacted patient father via phone ~2p to come sit with patient. Patient father stated he would be up in about 1 hour.  Patient exhibiting compulsive behavior attempting to climb between the side rails and reaching for items in the air.  Patient asked to call his father. Patient able to recite phone number. Allowed to speak with father via phone.  Dr. Brien Few notified of patient behavior and the need for restraints to keep patient safe. Orders received. Sitter arrived ~320pm. Patients father arrived around 8p. Patient currently lying in bed without incident. Patient bed alarm armed.  Will continue to monitor. Deetya Drouillard, Charlyne Quale

## 2012-08-13 NOTE — Progress Notes (Signed)
Unable to do bladder scan,pt. Has been agitated,refusing vital signs,refusing to eat and medications.keep monitoring pt. Closely and assessing his needs

## 2012-08-14 ENCOUNTER — Inpatient Hospital Stay (HOSPITAL_COMMUNITY): Payer: BC Managed Care – PPO

## 2012-08-14 LAB — RENAL FUNCTION PANEL
BUN: 23 mg/dL (ref 6–23)
Calcium: 9.7 mg/dL (ref 8.4–10.5)
Glucose, Bld: 101 mg/dL — ABNORMAL HIGH (ref 70–99)
Phosphorus: 4.5 mg/dL (ref 2.3–4.6)
Sodium: 134 mEq/L — ABNORMAL LOW (ref 135–145)

## 2012-08-14 LAB — CULTURE, BLOOD (ROUTINE X 2)
Culture: NO GROWTH
Culture: NO GROWTH

## 2012-08-14 LAB — URINALYSIS, ROUTINE W REFLEX MICROSCOPIC
Glucose, UA: NEGATIVE mg/dL
Ketones, ur: NEGATIVE mg/dL
Protein, ur: 30 mg/dL — AB

## 2012-08-14 LAB — CBC
Hemoglobin: 8.5 g/dL — ABNORMAL LOW (ref 13.0–17.0)
MCH: 29.8 pg (ref 26.0–34.0)
MCHC: 34.3 g/dL (ref 30.0–36.0)

## 2012-08-14 LAB — URINE MICROSCOPIC-ADD ON

## 2012-08-14 MED ORDER — HALOPERIDOL LACTATE 5 MG/ML IJ SOLN
2.0000 mg | Freq: Once | INTRAMUSCULAR | Status: AC
  Administered 2012-08-14: 2 mg via INTRAMUSCULAR

## 2012-08-14 MED ORDER — VANCOMYCIN HCL 1000 MG IV SOLR: 750.0000 mg | INTRAVENOUS | Status: DC

## 2012-08-14 MED ORDER — VANCOMYCIN HCL 1000 MG IV SOLR
1250.0000 mg | Freq: Once | INTRAVENOUS | Status: AC
  Administered 2012-08-14: 1250 mg via INTRAVENOUS
  Filled 2012-08-14: qty 1250

## 2012-08-14 NOTE — Progress Notes (Signed)
Call to mom Almira Coaster   [phone]  regarding letter request

## 2012-08-14 NOTE — Progress Notes (Signed)
Notified patient's father, Barnaby Rippeon of patient's order to be placed on restraints for his safety.Patient's father agreed and understands the need for the restraints. Jozlynn Plaia Joselita,RN

## 2012-08-14 NOTE — Progress Notes (Signed)
CSW was to see patient for psych consult today; two attempts to see patient failed. PT was leaving patient's room at 2:20 PM as he had refused to participate for PT: PT suggested return visit as patient was not in the mood for conversation. He was observed facing away from sitter with covers over him, decision to make approach later in the day. Writer unable to wake patient at 4:45 PM.  Carney Bern, LCSWA Clinical Social Worker [phone] .

## 2012-08-14 NOTE — Progress Notes (Signed)
Occupational Therapy Treatment Patient Details Name: Martin Chapman MRN: [id number] DOB: September 01, 1991 Today's Date: 08/14/2012 Time: 4098-1191 OT Time Calculation (min): 23 min  OT Assessment / Plan / Recommendation Comments on Treatment Session Pt lethargic this session. Able to minimially participate with OT this pm. Educated pt's mother on rehab process. see ADL below.    Follow Up Recommendations  CIR    Barriers to Discharge   none    Equipment Recommendations  Tub/shower seat    Recommendations for Other Services Rehab consult  Frequency Min 3X/week   Plan Discharge plan remains appropriate    Precautions / Restrictions Precautions Precautions: Fall Precaution Comments: confused/agitated Restrictions Weight Bearing Restrictions: No   Pertinent Vitals/Pain No apparent pain    ADL  ADL Comments: focus of seesion trying to engage pt in activity. completed bed mobility at S level. lethargic throughout session. ended session as pt began to become agitated. disussed current state of agitation/confusion with pt's mother and explained process of recovery and how she could interact with her son to avoid escallation into agitation. Educated mother to not argue or question pt at this stage and to interact when he was in his awake state.     OT Diagnosis:    OT Problem List:   OT Treatment Interventions:     OT Goals Acute Rehab OT Goals OT Goal Formulation: Patient unable to participate in goal setting Time For Goal Achievement: 08/21/12 Potential to Achieve Goals: Good ADL Goals Pt Will Perform Grooming: with min assist;Standing at sink Pt Will Perform Upper Body Bathing: with set-up;Sitting, chair;Sitting, edge of bed Pt Will Perform Lower Body Bathing: with supervision;Sit to stand from chair;Sit to stand from bed Pt Will Perform Lower Body Dressing: with supervision;Sit to stand from chair;Sit to stand from bed Pt Will Transfer to Toilet: with  supervision;Ambulation;with DME Pt Will Perform Toileting - Clothing Manipulation: with supervision;Standing Additional ADL Goal #1: Pt. will demonstrate full AROM Rt. UE as needed for ADLs. ADL Goal: Additional Goal #1 - Progress: Progressing toward goals Additional ADL Goal #2: Pt will demonstrate sustained attention during ADL task with min vc for redirection in nondistracting enviromnment. ADL Goal: Additional Goal #2 - Progress: Goal set today Miscellaneous OT Goals Miscellaneous OT Goal #1: Pt will demonstrate emergent awareness during functional ADL task with min vc. OT Goal: Miscellaneous Goal #1 - Progress: Goal set today  Visit Information  Last OT Received On: 08/14/12    Subjective Data      Prior Functioning    independent. Had been in drug rehab then D/Cd to halfway house in [place] where he was "kicked out" due to breaking the rules. Returned home to live with mother where he stared using again. Mother had son go to prison for 71 days. After getting out of prison, son went to live with his father, where he overdosed.   Cognition  Overall Cognitive Status: Impaired Area of Impairment: Attention;Memory;Following commands;Safety/judgement;Awareness of errors;Awareness of deficits;Problem solving;Executive functioning Arousal/Alertness: Lethargic Orientation Level: Disoriented to;Place;Time;Situation Behavior During Session: Lethargic Current Attention Level: Focused Attention - Other Comments: unable to remain awake . 30 seconds Memory: Decreased recall of precautions Following Commands: Follows one step commands inconsistently Safety/Judgement: Decreased awareness of safety precautions;Decreased safety judgement for tasks assessed;Impulsive;Decreased awareness of need for assistance Awareness of Errors: Assistance required to identify errors made;Assistance required to correct errors made Executive Functioning: poor self monitoring; poor reasoning and attention      Mobility  Shoulder Instructions Bed Mobility Bed Mobility: Supine  to Sit Rolling Left: 5: Supervision Left Sidelying to Sit: 5: Supervision Supine to Sit: 5: Supervision Sitting - Scoot to Edge of Bed: 5: Supervision Sit to Supine: 5: Supervision Transfers Details for Transfer Assistance: declined       Exercises  General Exercises - Upper Extremity Elbow Flexion: AAROM;PROM;Right Elbow Extension: AROM;AAROM;Right Digit Composite Flexion:  (limited participation due to lethargy)   Balance     End of Session OT - End of Session Activity Tolerance: Treatment limited secondary to agitation;Patient limited by fatigue;Treatment limited secondary to medication Patient left: in bed;with call bell/phone within reach;with nursing in room;Other (comment) Psychiatrist) Nurse Communication: Mobility status  GO     Martin Chapman,Martin Chapman 08/14/2012, 3:22 PM [facility], OTR/L  [phone] 08/14/2012

## 2012-08-14 NOTE — Progress Notes (Signed)
Patient ID: Martin Chapman, male   DOB: [birth year], 21 y.o.   MRN: [id number] S:more awake and alert this am.  Oriented to person and knew what he did to himself and that he was in a hospital but not sure where O:BP 145/95  Pulse 101  Temp 100.5 F (38.1 C) (Oral)  Resp 17  Ht 5\' 7"  (1.702 m)  Wt 62.1 kg (136 lb 14.5 oz)  BMI 21.44 kg/m2  SpO2 97%  Intake/Output Summary (Last 24 hours) at 08/14/12 1030 Last data filed at 08/14/12 1014  Gross per 24 hour  Intake   1020 ml  Output   3400 ml  Net  -2380 ml   Intake/Output: I/O last 3 completed shifts: In: 1080 [P.O.:1080] Out: 3400 [Urine:300; Other:3100]  Intake/Output this shift:  Total I/O In: 180 [P.O.:180] Out: -  Weight change: -3.7 kg (-8 lb 2.5 oz) Gen:WD WN WM in NAD CVS:no rub Resp:CTA YNW:GNFAOZ Ext:no edema   Lab 08/14/12 0605 08/13/12 0650 08/12/12 0615 08/11/12 0645 08/10/12 1211 08/08/12 0904  NA 134* 132* 135 134* 133* 135  K 4.0 3.9 4.0 4.3 3.8 4.3  CL 92* 90* 93* 91* 92* 93*  CO2 28 24 24 23 25 23   GLUCOSE 101* 108* 122* 113* 112* 88  BUN 23 36* 29* 17 39* 57*  CREATININE 3.97* 5.99* 6.54* 5.65* 10.12* 12.01*  ALBUMIN 3.7 3.5 3.5 3.7 3.1* 2.7*  CALCIUM 9.7 9.4 9.5 9.7 8.9 8.4  PHOS 4.5 5.2* 6.7* -- 7.1* 8.2*  AST -- -- -- 41* -- --  ALT -- -- -- 18 -- --   Liver Function Tests:  Lab 08/14/12 0605 08/13/12 0650 08/12/12 0615 08/11/12 0645  AST -- -- -- 41*  ALT -- -- -- 18  ALKPHOS -- -- -- 99  BILITOT -- -- -- 0.4  PROT -- -- -- 7.7  ALBUMIN 3.7 3.5 3.5 --   No results found for this basename: LIPASE:3,AMYLASE:3 in the last 168 hours No results found for this basename: AMMONIA:3 in the last 168 hours CBC:  Lab 08/14/12 0605 08/13/12 0650 08/12/12 0615 08/11/12 0645 08/10/12 1205  WBC 12.1* 15.4* 13.3* -- --  NEUTROABS -- -- -- -- --  HGB 8.5* 8.1* 8.5* -- --  HCT 24.8* 24.4* 25.7* -- --  MCV 87.0 84.4 85.7 85.9 85.1  PLT 531* 647* 603* -- --   Cardiac Enzymes: No results  found for this basename: CKTOTAL:5,CKMB:5,CKMBINDEX:5,TROPONINI:5 in the last 168 hours CBG: No results found for this basename: GLUCAP:5 in the last 168 hours  Iron Studies: No results found for this basename: IRON,TIBC,TRANSFERRIN,FERRITIN in the last 72 hours Studies/Results: Dg Chest Port 1 View  08/14/2012  *RADIOLOGY REPORT*  Clinical Data: Fever  PORTABLE CHEST - 1 VIEW  Comparison: August 08, 2012  Findings:  Central catheter tip is at the junction of the superior vena cava and right atrium.  No pneumothorax.  The lungs are clear. The heart size and pulmonary vascularity are normal.  No adenopathy.  There is an old healed fracture of the left clavicle.  IMPRESSION: Lungs clear. Central catheter position unchanged.  No pneumothorax.   Original Report Authenticated By: Bretta Bang, M.D.       Marland Kitchen Chlorhexidine Gluconate Cloth  6 each Topical Q0600  . cloNIDine  0.2 mg Oral TID  . darbepoetin (ARANESP) injection - DIALYSIS  150 mcg Intravenous Q Mon-HD  . divalproex  250 mg Oral Q12H  . famotidine  20 mg Oral Daily  .  feeding supplement  1 Container Oral TID BM  . ferric gluconate (FERRLECIT/NULECIT) IV  125 mg Intravenous Q M,W,F-HD  . fluconazole  100 mg Oral q1800  . [COMPLETED] haloperidol lactate  2 mg Intramuscular Once  . [COMPLETED] LORazepam      . [COMPLETED] LORazepam  1 mg Intravenous Once  . LORazepam  2 mg Oral BID  . methadone  2.5 mg Oral Q12H  . multivitamin  1 tablet Oral QHS  . saccharomyces boulardii  250 mg Oral BID  . silver sulfADIAZINE   Topical Daily  . thiamine  100 mg Oral Daily  . vancomycin  1,250 mg Intravenous Once  . vancomycin  750 mg Intravenous Q M,W,F-HD  . [DISCONTINUED] cloNIDine  0.1 mg Oral TID  . [DISCONTINUED] heparin subcutaneous  5,000 Units Subcutaneous Q8H    BMET    Component Value Date/Time   NA 134* 08/14/2012 0605   K 4.0 08/14/2012 0605   CL 92* 08/14/2012 0605   CO2 28 08/14/2012 0605   GLUCOSE 101* 08/14/2012 0605    BUN 23 08/14/2012 0605   CREATININE 3.97* 08/14/2012 0605   CALCIUM 9.7 08/14/2012 0605   GFRNONAA 20* 08/14/2012 0605   GFRAA 23* 08/14/2012 0605   CBC    Component Value Date/Time   WBC 12.1* 08/14/2012 0605   RBC 2.85* 08/14/2012 0605   HGB 8.5* 08/14/2012 0605   HCT 24.8* 08/14/2012 0605   PLT 531* 08/14/2012 0605   MCV 87.0 08/14/2012 0605   MCH 29.8 08/14/2012 0605   MCHC 34.3 08/14/2012 0605   RDW 13.6 08/14/2012 0605   LYMPHSABS 1.8 08/06/2012 0500   MONOABS 1.5* 08/06/2012 0500   EOSABS 0.2 08/06/2012 0500   BASOSABS 0.0 08/06/2012 0500  ASSESSMENT/PLAN: 1. AMS- ?anoxic brain injury or psychiatric illness. Would recommend Psych eval for drug rehab and also consider Neuro eval due to his AMS and impulse control issue to explore if this is related to anoxic brain injury.  His major issue is related to his Psychiatric diagnosis and therefore should have an official Psych consult. 2. 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 Starting to make some urine and his pre-HD creatinine was lower than expected last treatment.  Will hold off HD and follow UOP and daily Scr.  Hopefully he is starting to have some recovery. 3. 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 Nutrition:not eating 6. Hypertension:stable 7. Disposition- difficult situation if he has no recovery of renal function and is 20 days out from inititiating HD on 07/23/12. 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 Will need to discuss further with SW and Case management about options.  I spoke with him candidly about his addiction and he is willing to speak with a Psychiatrist and go into a drug rehab program. 8.   Nitara Szczerba A

## 2012-08-14 NOTE — Progress Notes (Signed)
[facility] PROGRESS NOTE  QUANG THORPE AVW:[id number] DOB: 31-Oct-1990 DOA: 07/22/2012 PCP: No primary provider on file.  Assessment/Plan: Principal Problem:  *Drug overdose, multiple drugs Active Problems:  Acute renal failure  Compartment syndrome of upper extremity  Rhabdomyolysis  Shock liver  Aspiration into lower respiratory tract  Postoperative respiratory failure  C. difficile colitis  Encephalopathy  Anemia    1. Acute hypoxic respiratory failure:  Patient presented with altered mental status, secondary to narcotic overdose and alcohol ingestion. He was found to be hypoxic, unable to protect airway, and intubated.. The likely culprit was aspiration pneumonia/pneumonitis. He was managed with ventilatory support and antibiotics, with satisfactory ckinical response, and was successfully extubated on10/27/13. Respiratory status has remained stable since, and he is now saturating at 97%-100% on room air.  2. Hypovolemic/septic shock: This was evident at the time of admission, and was addressed with antibiotics, pressors and volume resuscitation. Pressors were finally weaned off on10/24/13. He did have Lactic acidosis likely secondary to shock and rhabdomyolysis, but this has since resolved.   3. Rhabdomyolysis/Acute renal failure: Patient was found to have elevated CK levels, consistent with rhabdomyolysis, possibly due to substance abuse, sepsis and shock. Rhabdomyolysis has resolved with iv fluids, but patient developed progressive renal failure, necessitating CVVH, then subsequently transitioned to intermittent dialysis. Perm cath placement was on 07/31/12. There appears to be no improvement in patient's renal status at this time. Managing per nephrology.  4. Elevated LFT's and INR: This was likely due to shock liver. Resolved as of 08/11/12.  5. C. Difficile infection: Patient developed diarrhea on 07/28/12, and C.Difficile PCR was positive. Managed with oral  Vancomycin. Now on day #15. As patient's stools have improved, and his last dose of Zosyn was on 08/29/12. Vancomycin was discontinued on 08/11/12. Repeat C. Difficile PCR is pending. Diarrhea has resolved.  6. Hyperglycemia: Patient did have hyperglycemia in the initial days of hospitalization. This has resolved, and may have been secondary to stress of acute illness.  7. Acute encephalopathy: This was toxic/metabolic. There may also be an element of anoxic brain damage. EEG revealed findings consistent with mild encephalopathy, and brain MRI brain was negative for acute pathology. B12, Thiamine, ammonia, TSH, all within normal limits.HIV test and RPR were negative. Mental status is close to new baseline at this time, although patient occasionally appears disoriented and forgetful. He has had some periods of severe agitation on 08/13/12, requiring 1:1 Sitter and restraints. Psychiatrist consultation has been requested. 8. Right upper extremity injury with compartment syndrome and possible vascular injury: Dr Aldean Baker was consulted for management, and performed surgery with shoulder release and fasciotomy of right arm on 07/22/12, followed by excision of biceps muscle right arm, neurolysis median nerve, secondary closure of triceps and biceps incision, with application of wound vac on 07/24/12. Arch aortogram in OR normal. No vascular deficit in RUE/shoulder. Managing per Dr. Lajoyce Corners and wound care. Wound vac has since been discontinued, and wound is healing well.   9. Oral thrush: An incidental finding on physical exam on 08/10/12. Commenced on a 7-day course of oral Diflucan, to be concluded on 08/16/12.  10. HTN: Patient's BP has been persistently elevated, since resolution of shock, in spite of hemodialysis. Adjusting Clonidine as indicated, with improvement.  11. Pyrexia: Patient spiked a temperature of 101.2 overnight. The wound on his right arm, appears to have some mildly purulent exudate in the distal  portions, and he does have a central catheter. Wcc has been climbing in the past  few days, was 15.5 on 08/13/12, and 12.1 today. Will send off blood cultures, do urinalysis and portable CXR, but will commence empiric iv Vancomycin, as he may have an infection, which could be the culprit for his worsened altered mental status.     Code Status: Full code.  Family Communication:  Disposition Plan: To be determined. Per renal, will need to be inpatient for 6 weeks until declared ESRD.    Brief narrative: 21 years old male with PMH relevant for heroin addiction, depression, anxiety and panic attacks. He overdosed on heroin and alcohol on 07/21/12 and on 07/22/12, he was found unresponsive at about 2:00 pm. He was brought to the ED by friends. At admission he was unresponsive with good response to Narcan. He was hypotensive with MAP;s in the 40's despite aggressive IVF resuscitation. Required Narcan drip to stay awake. His Lactate at admission was 10 and he was hypothermic. Admitted to ICU and managed with fluids, pressors and antibiotics. On 07/24/12, he had fasciotomy and angiography by Dr Lajoyce Corners for right arm compartment syndrome with a right peri clavicular hematoma. Dr Darrick Penna placed a temporary right femoral HD catheter at that time, and patient was commenced on dialysis, for progressive ARF. He was transferred to [facility] service on 08/10/12.    Consultants:  Dr Aldean Baker, orthopedic surgeon.  Dr Jacquelynn Cree, VVS.   Dr Durene Cal, VVS.  Dr Zetta Bills, nephrologist.  Dr Onalee Hua, neurologist.     Procedures:  See hospital course.   Antibiotics:  Oral vancomycin  Cefazolin/Zosyn. Now completed.   HPI/Subjective: Calmer.   Objective: Vital signs in last 24 hours: Temp:  [97.9 F (36.6 C)-101.2 F (38.4 C)] 99.8 F (37.7 C) (11/12 0635) Pulse Rate:  [96-118] 98  (11/12 0635) Resp:  [15-22] 18  (11/12 0635) BP: (137-162)/(84-114) 151/84 mmHg (11/12 0635) SpO2:   [95 %-99 %] 97 % (11/12 0635) Weight:  [62.1 kg (136 lb 14.5 oz)] 62.1 kg (136 lb 14.5 oz) (11/11 1051) Weight change: -3.7 kg (-8 lb 2.5 oz) Last BM Date: 08/13/12  Intake/Output from previous day: 11/11 0701 - 11/12 0700 In: 840 [P.O.:840] Out: 3400 [Urine:300]     Physical Exam: General: Calmer this morning.  HEENT:  Moderate clinical pallor, no jaundice, no conjunctival injection or discharge. Oral thrush has improved. NECK:  Supple, JVP not seen, no carotid bruits, no palpable lymphadenopathy, no palpable goiter. CHEST:  Clinically clear to auscultation, no wheezes, no crackles. Left subclavian HD catheter appears unremarkable.  HEART:  Sounds 1 and 2 heard, normal, regular, no murmurs. ABDOMEN:  Flat, soft, non-tender, no palpable organomegaly, no palpable masses, normal bowel sounds. GENITALIA:  Not examined. UPPER EXTREMITIES: Well healing scar noted on right upper arm and shoulder, but some purulence in distal portion.  LOWER EXTREMITIES:  No pitting edema, palpable peripheral pulses. MUSCULOSKELETAL SYSTEM:  Unremarkable. CENTRAL NERVOUS SYSTEM:  No focal neurologic deficit on gross examination.  Lab Results:  [facility] 08/14/12 0605 08/13/12 0650  WBC 12.1* 15.4*  HGB 8.5* 8.1*  HCT 24.8* 24.4*  PLT 531* 647*    Basename 08/14/12 0605 08/13/12 0650  NA 134* 132*  K 4.0 3.9  CL 92* 90*  CO2 28 24  GLUCOSE 101* 108*  BUN 23 36*  CREATININE 3.97* 5.99*  CALCIUM 9.7 9.4   Recent Results (from the past 240 hour(s))  CSF CULTURE     Status: Normal   Collection Time   08/05/12  5:50 PM      Component  Value Range Status Comment   Specimen Description CSF   Final    Special Requests NONE   Final    Gram Stain     Final    Value: CYTOSPIN NO WBC SEEN     NO ORGANISMS SEEN   Culture NO GROWTH 3 DAYS   Final    Report Status 08/09/2012 FINAL   Final   AFB CULTURE WITH SMEAR     Status: Normal (Preliminary result)   Collection Time   08/05/12  5:50 PM       Component Value Range Status Comment   Specimen Description CSF   Final    Special Requests NONE   Final    ACID FAST SMEAR NOT DONE   Final    Culture     Final    Value: CULTURE WILL BE EXAMINED FOR 6 WEEKS BEFORE ISSUING A FINAL REPORT   Report Status PENDING   Incomplete   FUNGUS CULTURE W SMEAR     Status: Normal (Preliminary result)   Collection Time   08/05/12  5:50 PM      Component Value Range Status Comment   Specimen Description CSF   Final    Special Requests NONE   Final    Fungal Smear NO YEAST OR FUNGAL ELEMENTS SEEN   Final    Culture CULTURE IN PROGRESS FOR FOUR WEEKS   Final    Report Status PENDING   Incomplete   CULTURE, BLOOD (ROUTINE X 2)     Status: Normal   Collection Time   08/08/12  1:40 PM      Component Value Range Status Comment   Specimen Description BLOOD LEFT HAND   Final    Special Requests BOTTLES DRAWN AEROBIC ONLY 10CC   Final    Culture  Setup Time 08/08/2012 19:01   Final    Culture NO GROWTH 5 DAYS   Final    Report Status 08/14/2012 FINAL   Final   CULTURE, BLOOD (ROUTINE X 2)     Status: Normal   Collection Time   08/08/12  1:50 PM      Component Value Range Status Comment   Specimen Description BLOOD RIGHT HAND   Final    Special Requests BOTTLES DRAWN AEROBIC ONLY 10CC   Final    Culture  Setup Time 08/08/2012 19:01   Final    Culture NO GROWTH 5 DAYS   Final    Report Status 08/14/2012 FINAL   Final      Studies/Results: No results found.  Medications: Scheduled Meds:    . Chlorhexidine Gluconate Cloth  6 each Topical Q0600  . cloNIDine  0.2 mg Oral TID  . darbepoetin (ARANESP) injection - DIALYSIS  150 mcg Intravenous Q Mon-HD  . divalproex  250 mg Oral Q12H  . famotidine  20 mg Oral Daily  . feeding supplement  1 Container Oral TID BM  . ferric gluconate (FERRLECIT/NULECIT) IV  125 mg Intravenous Q M,W,F-HD  . fluconazole  100 mg Oral q1800  . [COMPLETED] haloperidol lactate  2 mg Intramuscular Once  . [COMPLETED]  LORazepam      . [COMPLETED] LORazepam  1 mg Intravenous Once  . LORazepam  2 mg Oral BID  . methadone  2.5 mg Oral Q12H  . multivitamin  1 tablet Oral QHS  . saccharomyces boulardii  250 mg Oral BID  . silver sulfADIAZINE   Topical Daily  . thiamine  100 mg Oral Daily  . [DISCONTINUED] cloNIDine  0.1 mg Oral TID  . [DISCONTINUED] heparin subcutaneous  5,000 Units Subcutaneous Q8H   Continuous Infusions:    . sodium chloride 20 mL/hr at 08/05/12 0645  . dextrose 5 % and 0.9% NaCl 10 mL/hr at 07/29/12 0914   PRN Meds:.acetaminophen (TYLENOL) oral liquid 160 mg/5 mL, haloperidol lactate, lidocaine, LORazepam, ondansetron, oxyCODONE, pentafluoroprop-tetrafluoroeth, [DISCONTINUED] heparin    LOS: 23 days   Desera Graffeo,CHRISTOPHER  [facility] Pager [phone]. If 8PM-8AM, please contact night-coverage at [email or web address], password [facility] 08/14/2012, 8:46 AM  LOS: 23 days

## 2012-08-14 NOTE — Progress Notes (Signed)
Physical Therapy Treatment Patient Details Name: Martin Chapman MRN: [id number] DOB: 11-22-1990 Today's Date: 08/14/2012 Time: 4403-4742 PT Time Calculation (min): 13 min  PT Assessment / Plan / Recommendation Comments on Treatment Session  Pt was awoken by PT treatment team and was able to sit up in bed on the edge of the bed; treatment session terminated early secondary to pt's request to lay back down and increasing agitation and lethargy.  Still, apparent improvement in mental status today compared to yesterday; Hopeful for pt to improve cognitively over next few days and sessions and be able to participate in Rehab    Follow Up Recommendations  CIR     Does the patient have the potential to tolerate intense rehabilitation  Yes, Recommend IP Rehab Screening  Barriers to Discharge        Equipment Recommendations  Tub/shower seat    Recommendations for Other Services Rehab consult;OT consult  Frequency Min 3X/week   Plan Frequency remains appropriate (Consult with psychiatric treatment team and plan for rehab)    Precautions / Restrictions Precautions Precautions: Fall Precaution Booklet Issued: No Precaution Comments: confused/agitated Restrictions Weight Bearing Restrictions: No   Pertinent Vitals/Pain no apparent distress     Mobility  Bed Mobility Bed Mobility: Supine to Sit Rolling Left: 5: Supervision Left Sidelying to Sit: 5: Supervision Supine to Sit: 4: Min guard Sitting - Scoot to Edge of Bed: 5: Supervision Sit to Supine: 7: Supervision Much encouragement to participate today, however pt declining further mobility, and stating he want to lay down to sleep; Demonstrates good strength, pushing back to lay down Transfers Details for Transfer Assistance: declined Ambulation/Gait Stairs: No    Exercises General Exercises - Upper Extremity Elbow Flexion: AAROM;PROM;Right Elbow Extension: AROM;AAROM;Right Digit Composite Flexion:  (limited  participation due to lethargy)   PT Diagnosis:    PT Problem List:   PT Treatment Interventions:     PT Goals Acute Rehab PT Goals PT Goal Formulation: Patient unable to participate in goal setting Pt will go Supine/Side to Sit: with supervision PT Goal: Supine/Side to Sit - Progress: Other (comment) (Unable to evaluate due to early termination of session) Pt will go Sit to Stand: with supervision PT Goal: Sit to Stand - Progress: Other (comment) (Unable to evaluate due to early termination of session) Pt will go Stand to Sit: with supervision PT Goal: Stand to Sit - Progress: Other (comment) (Unable to evaluate due to early termination of session) Pt will Transfer Bed to Chair/Chair to Bed: with supervision PT Transfer Goal: Bed to Chair/Chair to Bed - Progress: Other (comment) (Unable to evaluate due to early termination of session) Pt will Ambulate: >150 feet;with supervision;with least restrictive assistive device PT Goal: Ambulate - Progress: Other (comment) (Unable to evaluate due to early termination of session)  Visit Information  Last PT Received On: 08/14/12 Assistance Needed: +1    Subjective Data  Subjective: Pt stated did not want to "be bothered and wants to sleep"   Cognition  Overall Cognitive Status: Impaired Area of Impairment: Safety/judgement;Awareness of deficits Arousal/Alertness: Lethargic Orientation Level: Disoriented X4 Behavior During Session: Lethargic Current Attention Level: Selective Attention - Other Comments: unable to remain awake . 30 seconds Memory: Decreased recall of precautions Following Commands: Follows one step commands inconsistently Safety/Judgement: Decreased awareness of need for assistance Awareness of Errors: Assistance required to identify errors made;Assistance required to correct errors made Executive Functioning: poor self monitoring; poor reasoning and attention    Balance  Dynamic Sitting Balance Dynamic  Sitting -  Balance Support: Feet supported;Right upper extremity supported Dynamic Sitting - Level of Assistance: 4: Min assist  End of Session PT - End of Session Activity Tolerance: Treatment limited secondary to agitation Patient left: in bed;with family/visitor present Nurse Communication: Mobility status   GP     Van Clines [facility] [place], [place] [phone]  08/14/2012, 4:54 PM

## 2012-08-14 NOTE — Progress Notes (Signed)
Rehab admissions - Evaluated for possible admission.  Please see rehab consult done by Dr. Wynn Banker on 11/08.  Noted patient could not participate with therapies yesterday.  I have placed a call to parents and I am waiting to discuss rehab plans with parents.  Patient was very agitated yesterday and could not focus.  Will follow progress for now.  Call me for questions.  #[phone]

## 2012-08-14 NOTE — Progress Notes (Addendum)
ANTIBIOTIC CONSULT NOTE - INITIAL  Pharmacy Consult for vancomycin Indication: pyrexia, wound on R arm- mildly purulent exudate, has central line  No Known Allergies  Patient Measurements: Height: 5\' 7"  (170.2 cm) Weight: 136 lb 14.5 oz (62.1 kg) IBW/kg (Calculated) : 66.1    Vital Signs: Temp: 99.8 F (37.7 C) (11/12 0635) Temp src: Oral (11/12 0635) BP: 151/84 mmHg (11/12 0635) Pulse Rate: 98  (11/12 0635) Intake/Output from previous day: 11/11 0701 - 11/12 0700 In: 840 [P.O.:840] Out: 3400 [Urine:300] Intake/Output from this shift: Total I/O In: 60 [P.O.:60] Out: -   Labs:  Basename 08/14/12 0605 08/13/12 0650 08/12/12 0615  WBC 12.1* 15.4* 13.3*  HGB 8.5* 8.1* 8.5*  PLT 531* 647* 603*  LABCREA -- -- --  CREATININE 3.97* 5.99* 6.54*   Estimated Creatinine Clearance: 25.4 ml/min (by C-G formula based on Cr of 3.97). No results found for this basename: VANCOTROUGH:2,VANCOPEAK:2,VANCORANDOM:2,GENTTROUGH:2,GENTPEAK:2,GENTRANDOM:2,TOBRATROUGH:2,TOBRAPEAK:2,TOBRARND:2,AMIKACINPEAK:2,AMIKACINTROU:2,AMIKACIN:2, in the last 72 hours   Microbiology: Recent Results (from the past 720 hour(s))  URINE CULTURE     Status: Normal   Collection Time   07/22/12  4:32 PM      Component Value Range Status Comment   Specimen Description URINE, CATHETERIZED   Final    Special Requests NONE   Final    Culture  Setup Time 07/23/2012 11:13   Final    Colony Count NO GROWTH   Final    Culture NO GROWTH   Final    Report Status 07/24/2012 FINAL   Final   CULTURE, BLOOD (ROUTINE X 2)     Status: Normal   Collection Time   07/22/12  6:42 PM      Component Value Range Status Comment   Specimen Description BLOOD LEFT ARM   Final    Special Requests BOTTLES DRAWN AEROBIC ONLY   Final    Culture  Setup Time 07/23/2012 10:16   Final    Culture NO GROWTH 5 DAYS   Final    Report Status 07/29/2012 FINAL   Final   CULTURE, BLOOD (ROUTINE X 2)     Status: Normal   Collection Time   07/22/12  6:51 PM      Component Value Range Status Comment   Specimen Description BLOOD RIGHT ARM   Final    Special Requests BOTTLES DRAWN AEROBIC ONLY   Final    Culture  Setup Time 07/23/2012 10:16   Final    Culture NO GROWTH 5 DAYS   Final    Report Status 07/29/2012 FINAL   Final   MRSA PCR SCREENING     Status: Normal   Collection Time   07/23/12 12:30 AM      Component Value Range Status Comment   MRSA by PCR NEGATIVE  NEGATIVE Final   CULTURE, BLOOD (ROUTINE X 2)     Status: Normal   Collection Time   07/25/12  8:30 AM      Component Value Range Status Comment   Specimen Description BLOOD LEFT HAND   Final    Special Requests BOTTLES DRAWN AEROBIC AND ANAEROBIC 10CC   Final    Culture  Setup Time 07/25/2012 13:56   Final    Culture NO GROWTH 5 DAYS   Final    Report Status 07/31/2012 FINAL   Final   CULTURE, BLOOD (ROUTINE X 2)     Status: Normal   Collection Time   07/25/12  8:40 AM      Component Value Range Status Comment  Specimen Description BLOOD RIGHT HAND   Final    Special Requests BOTTLES DRAWN AEROBIC AND ANAEROBIC 10CC   Final    Culture  Setup Time 07/25/2012 13:56   Final    Culture NO GROWTH 5 DAYS   Final    Report Status 07/31/2012 FINAL   Final   URINE CULTURE     Status: Normal   Collection Time   07/25/12  8:49 AM      Component Value Range Status Comment   Specimen Description URINE, CATHETERIZED   Final    Special Requests NONE   Final    Culture  Setup Time 07/25/2012 15:30   Final    Colony Count NO GROWTH   Final    Culture NO GROWTH   Final    Report Status 07/26/2012 FINAL   Final   CLOSTRIDIUM DIFFICILE BY PCR     Status: Abnormal   Collection Time   07/28/12  6:15 AM      Component Value Range Status Comment   C difficile by pcr POSITIVE (*) NEGATIVE Final   CULTURE, BLOOD (ROUTINE X 2)     Status: Normal   Collection Time   08/03/12  2:37 PM      Component Value Range Status Comment   Specimen Description BLOOD RIGHT HAND    Final    Special Requests BOTTLES DRAWN AEROBIC AND ANAEROBIC 10CC   Final    Culture  Setup Time 08/03/2012 21:42   Final    Culture NO GROWTH 5 DAYS   Final    Report Status 08/09/2012 FINAL   Final   CULTURE, BLOOD (ROUTINE X 2)     Status: Normal   Collection Time   08/03/12  3:51 PM      Component Value Range Status Comment   Specimen Description BLOOD ARM LEFT   Final    Special Requests BOTTLES DRAWN AEROBIC ONLY 10CC   Final    Culture  Setup Time 08/03/2012 21:42   Final    Culture NO GROWTH 5 DAYS   Final    Report Status 08/09/2012 FINAL   Final   CSF CULTURE     Status: Normal   Collection Time   08/05/12  5:50 PM      Component Value Range Status Comment   Specimen Description CSF   Final    Special Requests NONE   Final    Gram Stain     Final    Value: CYTOSPIN NO WBC SEEN     NO ORGANISMS SEEN   Culture NO GROWTH 3 DAYS   Final    Report Status 08/09/2012 FINAL   Final   AFB CULTURE WITH SMEAR     Status: Normal (Preliminary result)   Collection Time   08/05/12  5:50 PM      Component Value Range Status Comment   Specimen Description CSF   Final    Special Requests NONE   Final    ACID FAST SMEAR NOT DONE   Final    Culture     Final    Value: CULTURE WILL BE EXAMINED FOR 6 WEEKS BEFORE ISSUING A FINAL REPORT   Report Status PENDING   Incomplete   FUNGUS CULTURE W SMEAR     Status: Normal (Preliminary result)   Collection Time   08/05/12  5:50 PM      Component Value Range Status Comment   Specimen Description CSF   Final    Special Requests  NONE   Final    Fungal Smear NO YEAST OR FUNGAL ELEMENTS SEEN   Final    Culture CULTURE IN PROGRESS FOR FOUR WEEKS   Final    Report Status PENDING   Incomplete   CULTURE, BLOOD (ROUTINE X 2)     Status: Normal   Collection Time   08/08/12  1:40 PM      Component Value Range Status Comment   Specimen Description BLOOD LEFT HAND   Final    Special Requests BOTTLES DRAWN AEROBIC ONLY 10CC   Final    Culture  Setup Time  08/08/2012 19:01   Final    Culture NO GROWTH 5 DAYS   Final    Report Status 08/14/2012 FINAL   Final   CULTURE, BLOOD (ROUTINE X 2)     Status: Normal   Collection Time   08/08/12  1:50 PM      Component Value Range Status Comment   Specimen Description BLOOD RIGHT HAND   Final    Special Requests BOTTLES DRAWN AEROBIC ONLY 10CC   Final    Culture  Setup Time 08/08/2012 19:01   Final    Culture NO GROWTH 5 DAYS   Final    Report Status 08/14/2012 FINAL   Final     Medical History: Past Medical History  Diagnosis Date  . Anxiety   . Panic attacks   . Depression     Medications:  No prescriptions prior to admission   Assessment:  Pt is a 21 yo man admitted 10/19 for heroin OD complicated by ARF and rhabdomyolysis and RUE injury with compartment syndrome requiring fasciotomy of R arm 10/20 and wound vac on 10/22.  He is to start IV vancomycin today for temp of 101.2 overnight with R arm wound w/ mildly purulent exudate.  WBC 12.1.  He is now HD dependent ARF since 10/21. He rec'd HD 08/13/12 so anticipate next HD on TTS schedule.  Previously he completed a course of oral vancomycin for C diff and he is on day 5/7 of diflucan for oral thrush.  Goal of Therapy:  Vancomycin pre-HD level 15-25 mcg/ml  Plan:  1. Vancomycin 1250 mg loading dose today 2. MD is holding off on further HD for now 3. F/u HD schedule he will require Vancomycin 750 mg IV after HD  Herby Abraham, Pharm.D. [phone] 08/14/2012 9:36 AM

## 2012-08-14 NOTE — Consult Note (Signed)
Reason for Consult: Severe agitation Referring Physician: Dr. Benson Setting is an 21 y.o. male.  HPI: Patient chart reviewed and information obtained from staff RN and sitter who was next to he patient bed. Reportedly patient was admitted to [facility] cone medical floor on 07/22/12 for overdose on opioids. His UDS was positive for benzos, amphetamines, THC. Patient was unresponsive when his friends seeks medical attention for him and he was responded positively to Narcan. He was suffering with acute renal failure, rhabdomyolysis, shock liver and aspiration and post op respiratory failure. He was recently incarcerated for drug related charges. Patient was restless and agitated yesterday and trying to get out of hospital room and required restraints for his safety. He also received haldol for agitation.   MSE: Patient was sleeping deeply and unable to woke up with verbal command. He was briefly responded verbally but not a consistent response. He does appear comfortable and not in acute distress.   Past Medical History  Diagnosis Date  . Anxiety   . Panic attacks   . Depression     Past Surgical History  Procedure Date  . Extraction of wisdom teeth   . Intraoperative arteriogram 07/22/2012    Procedure: INTRA OPERATIVE ARTERIOGRAM;  Surgeon: Sherren Kerns, MD;  Location: [facility] OR;  Service: Vascular;  Laterality: Left;  arch aortagram, second order catheterization right subclavian artery, ultrasound left groin  . Insertion of dialysis catheter 07/22/2012    Procedure: INSERTION OF DIALYSIS CATHETER;  Surgeon: Sherren Kerns, MD;  Location: [facility] OR;  Service: Vascular;  Laterality: Right;  . Fasciotomy 07/22/2012    Procedure: FASCIOTOMY;  Surgeon: Nadara Mustard, MD;  Location: [facility] OR;  Service: Orthopedics;  Laterality: Right;  . I&d extremity 07/24/2012    Procedure: IRRIGATION AND DEBRIDEMENT EXTREMITY;  Surgeon: Nadara Mustard, MD;  Location: [facility] OR;  Service: Orthopedics;   Laterality: Right;  Right Arm Irrigation and Debridement, VAC Application  . Application of wound vac 07/24/2012    Procedure: APPLICATION OF WOUND VAC;  Surgeon: Nadara Mustard, MD;  Location: [facility] OR;  Service: Orthopedics;  Laterality: Right;  . Insertion of dialysis catheter 07/31/2012    Procedure: INSERTION OF DIALYSIS CATHETER;  Surgeon: Nada Libman, MD;  Location: [facility] OR;  Service: Vascular;  Laterality: Left;  internal jugular    Family History  Problem Relation Age of Onset  . Hypertension Father     Social History:  reports that he has been smoking Cigarettes.  He has been smoking about 1 pack per day. He does not have any smokeless tobacco history on file. He reports that he drinks alcohol. He reports that he uses illicit drugs (Cocaine).  Allergies: No Known Allergies  Medications: I have reviewed the patient's current medications.  Results for orders placed during the hospital encounter of 07/22/12 (from the past 48 hour(s))  RENAL FUNCTION PANEL     Status: Abnormal   Collection Time   08/13/12  6:50 AM      Component Value Range Comment   Sodium 132 (*) 135 - 145 mEq/L    Potassium 3.9  3.5 - 5.1 mEq/L    Chloride 90 (*) 96 - 112 mEq/L    CO2 24  19 - 32 mEq/L    Glucose, Bld 108 (*) 70 - 99 mg/dL    BUN 36 (*) 6 - 23 mg/dL    Creatinine, Ser 1.19 (*) 0.50 - 1.35 mg/dL    Calcium 9.4  8.4 -  10.5 mg/dL    Phosphorus 5.2 (*) 2.3 - 4.6 mg/dL    Albumin 3.5  3.5 - 5.2 g/dL    GFR calc non Af Amer 12 (*) >90 mL/min    GFR calc Af Amer 14 (*) >90 mL/min   CBC     Status: Abnormal   Collection Time   08/13/12  6:50 AM      Component Value Range Comment   WBC 15.4 (*) 4.0 - 10.5 K/uL    RBC 2.89 (*) 4.22 - 5.81 MIL/uL    Hemoglobin 8.1 (*) 13.0 - 17.0 g/dL    HCT 16.1 (*) 09.6 - 52.0 %    MCV 84.4  78.0 - 100.0 fL    MCH 28.0  26.0 - 34.0 pg    MCHC 33.2  30.0 - 36.0 g/dL    RDW 04.5  40.9 - 81.1 %    Platelets 647 (*) 150 - 400 K/uL   CBC     Status: Abnormal     Collection Time   08/14/12  6:05 AM      Component Value Range Comment   WBC 12.1 (*) 4.0 - 10.5 K/uL    RBC 2.85 (*) 4.22 - 5.81 MIL/uL    Hemoglobin 8.5 (*) 13.0 - 17.0 g/dL    HCT 91.4 (*) 78.2 - 52.0 %    MCV 87.0  78.0 - 100.0 fL    MCH 29.8  26.0 - 34.0 pg    MCHC 34.3  30.0 - 36.0 g/dL    RDW 95.6  21.3 - 08.6 %    Platelets 531 (*) 150 - 400 K/uL   RENAL FUNCTION PANEL     Status: Abnormal   Collection Time   08/14/12  6:05 AM      Component Value Range Comment   Sodium 134 (*) 135 - 145 mEq/L    Potassium 4.0  3.5 - 5.1 mEq/L    Chloride 92 (*) 96 - 112 mEq/L    CO2 28  19 - 32 mEq/L    Glucose, Bld 101 (*) 70 - 99 mg/dL    BUN 23  6 - 23 mg/dL DIALYSIS   Creatinine, Ser 3.97 (*) 0.50 - 1.35 mg/dL DIALYSIS   Calcium 9.7  8.4 - 10.5 mg/dL    Phosphorus 4.5  2.3 - 4.6 mg/dL    Albumin 3.7  3.5 - 5.2 g/dL    GFR calc non Af Amer 20 (*) >90 mL/min    GFR calc Af Amer 23 (*) >90 mL/min     Dg Chest Port 1 View  08/14/2012  *RADIOLOGY REPORT*  Clinical Data: Fever  PORTABLE CHEST - 1 VIEW  Comparison: August 08, 2012  Findings:  Central catheter tip is at the junction of the superior vena cava and right atrium.  No pneumothorax.  The lungs are clear. The heart size and pulmonary vascularity are normal.  No adenopathy.  There is an old healed fracture of the left clavicle.  IMPRESSION: Lungs clear. Central catheter position unchanged.  No pneumothorax.   Original Report Authenticated By: Bretta Bang, M.D.     Positive for illegal drug usage Blood pressure 145/95, pulse 101, temperature 100.5 F (38.1 C), temperature source Oral, resp. rate 17, height 5\' 7"  (1.702 m), weight 136 lb 14.5 oz (62.1 kg), SpO2 97.00%.   Assessment/Plan: 1. Polysubstance abuse vs dependence, Opioid dependence and overdose 2. Conduct disorder 3. Mood disorder NOS   Recommended: 1. Continue Sitter for safety 2. Haldol  2 mg PO/IM Q 6 hours PRN for agitation and aggressive behavior 3.  Ativan 1 mg PO /IM Q 6 Hours PRN for anxiety and irritability 4. Psychiatry consultation follow up as needed 5. Appreciate psych consultation.   Virgie Kunda,JANARDHAHA R. 08/14/2012, 12:44 PM

## 2012-08-14 NOTE — Progress Notes (Signed)
Rehab admissions - I met with patient's mom today.  I gave her rehab booklets and explained inpatient rehab.  Patient currently sleeping.  Patient did not participate well today or yesterday with therapies.  Patient has [employer] through his mom.  Mom says that patient did stay with her PTA, but mom is unable to take patient back into her home after inpatient rehab stay.  Dad is also unable to take patient into his home.  Patient will need drug rehab at some point after inpatient rehab.  We will need patient participating with therapies and will need authorization from [facility] prior to inpatient rehab admission.  Patient does not have a discharge plan after inpatient rehab at this point.  Call me for questions.  #[phone]

## 2012-08-14 NOTE — Progress Notes (Signed)
Patient awake,getting agitated and restless,trying to get out of room,safety sitter in room.MD on call notified and order obtained for restraints for patient's safety and haldol.Will continue to monitor. Aemon Koeller Joselita,RN

## 2012-08-15 LAB — RENAL FUNCTION PANEL
Albumin: 3.4 g/dL — ABNORMAL LOW (ref 3.5–5.2)
BUN: 31 mg/dL — ABNORMAL HIGH (ref 6–23)
Chloride: 95 mEq/L — ABNORMAL LOW (ref 96–112)
Creatinine, Ser: 3.94 mg/dL — ABNORMAL HIGH (ref 0.50–1.35)
Phosphorus: 5.3 mg/dL — ABNORMAL HIGH (ref 2.3–4.6)

## 2012-08-15 LAB — CBC
HCT: 25.3 % — ABNORMAL LOW (ref 39.0–52.0)
MCHC: 33.2 g/dL (ref 30.0–36.0)
MCV: 85.2 fL (ref 78.0–100.0)
RDW: 13.4 % (ref 11.5–15.5)

## 2012-08-15 NOTE — Progress Notes (Signed)
Occupational Therapy Treatment Patient Details Name: Martin Chapman MRN: [id number] DOB: Feb 26, 1991 Today's Date: 08/15/2012 Time: 4098-1191 OT Time Calculation (min): 18 min  OT Assessment / Plan / Recommendation Comments on Treatment Session Educated pt;s father on importance of encouraging  RUE AROM and functinal use of RUE. Discussed pt's level of agitation this pm and reluctance to participate  at times with pt's father. Educated pt's father to minimize conflict with pt at this time in order to avoid escalation of agitation. .    Follow Up Recommendations  CIR    Barriers to Discharge       Equipment Recommendations  Tub/shower seat    Recommendations for Other Services Rehab consult  Frequency Min 3X/week   Plan Discharge plan remains appropriate    Precautions / Restrictions Precautions Precautions: Fall Precaution Comments: agitated at times   Pertinent Vitals/Pain Did not rate. S/ o pain with ROM RUE    ADL  Grooming: Other (comment) (walked to sink with minguard but would not wash hands) ADL Comments: completed bed mobility with S. Trying to get of bed where rails were up. Impulsivity and decreased reaoning noted    OT Diagnosis:    OT Problem List:   OT Treatment Interventions:     OT Goals Acute Rehab OT Goals OT Goal Formulation: Patient unable to participate in goal setting Time For Goal Achievement: 08/21/12 Potential to Achieve Goals: Good ADL Goals Pt Will Perform Grooming: with min assist;Standing at sink ADL Goal: Grooming - Progress: Progressing toward goals Pt Will Perform Upper Body Bathing: with set-up;Sitting, chair;Sitting, edge of bed ADL Goal: Upper Body Bathing - Progress: Progressing toward goals Pt Will Perform Lower Body Bathing: with supervision;Sit to stand from chair;Sit to stand from bed Pt Will Perform Lower Body Dressing: with supervision;Sit to stand from chair;Sit to stand from bed Pt Will Transfer to Toilet: with  supervision;Ambulation;with DME Pt Will Perform Toileting - Clothing Manipulation: with supervision;Standing Additional ADL Goal #1: Pt. will demonstrate full AROM Rt. UE as needed for ADLs. ADL Goal: Additional Goal #1 - Progress: Progressing toward goals Additional ADL Goal #2: Pt will demonstrate sustained attention during ADL task with min vc for redirection in nondistracting enviromnment. ADL Goal: Additional Goal #2 - Progress: Progressing toward goals Miscellaneous OT Goals Miscellaneous OT Goal #1: Pt will demonstrate emergent awareness during functional ADL task with min vc. OT Goal: Miscellaneous Goal #1 - Progress: Progressing toward goals  Visit Information  Last OT Received On: 08/15/12 Assistance Needed: +1    Subjective Data      Prior Functioning       Cognition  Overall Cognitive Status: Impaired Area of Impairment: Safety/judgement;Following commands Arousal/Alertness: Lethargic Behavior During Session: Agitated Current Attention Level: Sustained Attention - Other Comments: Able to participate as short intervals Following Commands: Follows multi-step commands inconsistently Safety/Judgement: Decreased safety judgement for tasks assessed;Impulsive Safety/Judgement - Other Comments: trying to get out of bed with rail up Awareness of Errors: Assistance required to identify errors made;Assistance required to correct errors made Awareness of Errors - Other Comments: poor anticipatory awareness Problem Solving: Min A Executive Functioning: poor self monitoring and impulse control Cognition - Other Comments: inapparopriate language    Mobility  Shoulder Instructions Bed Mobility Bed Mobility: Supine to Sit Rolling Left: 5: Supervision Left Sidelying to Sit: 5: Supervision Supine to Sit: 5: Supervision Sitting - Scoot to Edge of Bed: 6: Modified independent (Device/Increase time) Transfers Transfers: Sit to Stand;Stand to Sit Sit to Stand: 4: Min guard;From  bed Stand to Sit: 4: Min guard;To bed Details for Transfer Assistance: implusively stood. LOB noted several times, requiring min A to adjust       Exercises  General Exercises - Upper Extremity Shoulder Flexion: Right;AROM;AAROM;10 reps;Supine Shoulder Extension: AROM;AAROM;Right;10 reps;Standing Shoulder Horizontal ABduction: AROM;AAROM;10 reps;Standing Shoulder Horizontal ADduction: AROM;AAROM;Right;10 reps;Standing Elbow Flexion: AROM;AAROM;Right;10 reps;Seated Elbow Extension: AROM;AAROM;Right;10 reps;Other (comment) (lacks @ 40 degrees extension) Digit Composite Flexion: AROM;Right;10 reps;Seated Composite Extension: AROM;Right;10 reps Hand Exercises Forearm Supination: AROM;AAROM;Right;5 reps Forearm Pronation: AROM;AAROM;Right;5 reps Other Exercises Other Exercises: lmitations in full supination /pronation   Balance  min A at times   End of Session OT - End of Session Activity Tolerance: Treatment limited secondary to agitation Patient left: in bed;with call bell/phone within reach;with family/visitor present Nurse Communication: Mobility status  GO     Anthony Tamburo,Martin Chapman 08/15/2012, 6:16 PM [facility], OTR/L  [phone] 08/15/2012

## 2012-08-15 NOTE — Progress Notes (Signed)
[facility] PROGRESS NOTE  Martin BEEGHLY ZOX:[id number] DOB: Dec 07, 1990 DOA: 07/22/2012 PCP: No primary provider on file.  Assessment/Plan: 1. Acute hypoxic respiratory failure: Patient presented with altered mental status, secondary to narcotic overdose and alcohol ingestion. He was found to be hypoxic, unable to protect airway, and intubated.. The likely culprit was aspiration pneumonia/pneumonitis. He was managed with ventilatory support and antibiotics, with satisfactory clinical response, and was successfully extubated on10/27/13. Respiratory status has remained stable since, and he is now saturating at 97%-100% on room air.  2. Hypovolemic/septic shock: This was evident at the time of admission, and was addressed with antibiotics, pressors and volume resuscitation. Pressors were finally weaned off on10/24/13. He did have Lactic acidosis likely secondary to shock and rhabdomyolysis, but this has since resolved.  3. Rhabdomyolysis/Acute renal failure: Patient was found to have elevated CK levels, consistent with rhabdomyolysis, possibly due to substance abuse, sepsis and shock. Rhabdomyolysis has resolved with iv fluids, but patient developed progressive renal failure, necessitating CVVH, then subsequently transitioned to intermittent dialysis. Perm cath placement was on 07/31/12. There appears to be no improvement in patient's renal status at this time. Managing per nephrology.  -Renal function continues to improve 4. Elevated LFT's and INR: This was likely due to shock liver. Resolved as of 08/11/12.  5. C. Difficile infection: Patient developed diarrhea on 07/28/12, and C.Difficile PCR was positive. Managed with oral Vancomycin. Now on day #15. As patient's stools have improved, and his last dose of Zosyn was on 08/29/12. Vancomycin was discontinued on 08/11/12. Repeat C. Difficile PCR is pending. Diarrhea has resolved.  6. Hyperglycemia: Patient did have hyperglycemia in the initial  days of hospitalization. This has resolved, and may have been secondary to stress of acute illness.  7. Acute encephalopathy: This was toxic/metabolic. There may also be an element of anoxic brain damage. EEG revealed findings consistent with mild encephalopathy, and brain MRI brain was negative for acute pathology. B12, Thiamine, ammonia, TSH, all within normal limits.HIV test and RPR were negative. Mental status is close to new baseline at this time, although patient occasionally appears disoriented and forgetful. He has had some periods of severe agitation on 08/13/12, requiring 1:1 Sitter and restraints. Psychiatrist consultation has been requested.--> Recommended Haldol and Ativan when necessary agitation -Will need referrals to outpatient alcoholic and Narcotics Anonymous 8. Right upper extremity injury with compartment syndrome and possible vascular injury: Dr Aldean Baker was consulted for management, and performed surgery with shoulder release and fasciotomy of right arm on 07/22/12, followed by excision of biceps muscle right arm, neurolysis median nerve, secondary closure of triceps and biceps incision, with application of wound vac on 07/24/12. Arch aortogram in OR normal. No vascular deficit in RUE/shoulder. Managing per Dr. Lajoyce Corners and wound care. Wound vac has since been discontinued, and wound is healing well.  9. Oral thrush: An incidental finding on physical exam on 08/10/12. Commenced on a 7-day course of oral Diflucan, to be concluded on 08/16/12.  10. HTN: Patient's BP has been persistently elevated, since resolution of shock, in spite of hemodialysis. Adjusting Clonidine as indicated, with improvement.  11. Pyrexia: Patient spiked a temperature of 101.2 overnight.  -No fevers in the last 24 hours 12. Metabolic encephalopathy -Speech therapy evaluation for cognitive linguistic assessment revealedHe demonstrated adequate focused and sustained attention in a quiet, non-distracting environment;  verbal recall of sequential words and problem-solving situations was normal after 2", 5", then 10" intervals. Problem-solving for hypothetical situations was functional - responses were vague but appropriate.  Mental flexibility for complex reasoning tasks was more difficult, with pt showing poor persistance. Verbal impulsivity noted. Uses/understands basic humor. Pt's symptoms appear less attributable to hypoxia than to behavioral/polysubstance hx and residual encephalopathic component, but will defer to psychiatry for diagnosis. No formal SLP intervention warranted on acute level       Procedures/Studies: [name] Contrast  08/04/2012  *RADIOLOGY REPORT*  Clinical Data: Polysubstance abuse.  Altered mental status.  Renal failure.  MRI HEAD WITHOUT CONTRAST  Technique:  Multiplanar, multiecho pulse sequences of the brain and surrounding structures were obtained according to standard protocol without intravenous contrast.  Comparison: CT head 04/08/2012 (normal)  Findings: The patient had difficulty remaining motionless for the study.  Images are suboptimal.  Small or subtle lesions could be overlooked.  There is no evidence for acute infarction, intracranial hemorrhage, mass lesion, hydrocephalus, or extra-axial fluid.  There may be mild premature cerebral and cerebellar atrophy for age.  There is no visible white matter disease.  No evidence for hypoxic ischemic encephalopathy.  No basal ganglia T1 shortening to suggest early hepatic encephalopathy.  Normal pituitary and cerebellar tonsils. Major intracranial flow voids are preserved.  Chronic sinus disease right division sphenoid sinus.  Slight dependent mastoid fluid bilaterally.  Because of renal failure, Gadolinium contrast could not be administered.  Meningeal enhancement or early changes of meningoencephalitis could be inapparent on this noncontrast examination.  IMPRESSION: Except for mild premature cerebral and cerebellar atrophy, there is no  visible acute intracranial abnormality.  No signs of hypoxic ischemic encephalopathy or acute/subacute infarction.  No foci of chronic hemorrhage.  No evidence for hepatic encephalopathy metabolic changes.   Original Report Authenticated By: Davonna Belling, M.D.    US Renal  07/23/2012  *RADIOLOGY REPORT*  Clinical Data: Acute renal failure  RENAL/URINARY TRACT ULTRASOUND COMPLETE  Comparison:  CT abdomen pelvis - 04/08/2012; chest radiograph - 07/23/2012  Findings:  Right Kidney:  Normal cortical thickness, echogenicity and size, measuring 13.2 cm in length.  There is a minimal amount of perinephric fluid.  No focal renal lesions.  No echogenic renal stones.  No urinary obstruction.  Left Kidney:  Normal cortical thickness, echogenicity and size, measuring 13.8 cm in length. There is a small amount of perirenal fluid.  No focal renal lesions.  No echogenic renal stones.  No urinary obstruction.  Bladder:  Decompressed with a Foley catheter  Small amount of ascites is noted within the pelvis.  There is a minimal amount of perisplenic fluid.  Small left-sided pleural effusion is incidentally noted  IMPRESSION: 1.  No sonographic explanation for patient's acute renal failure. Specifically, no evidence of urinary obstruction. 2.  Findings suggestive of volume overload/third spacing including a minimal amount of ascites, small left-sided pleural effusion and small amount of perirenal fluid, nonspecific but possibly attributable to reported history of acute renal failure.   Original Report Authenticated By: Waynard Reeds, M.D.    Dg Chest Port 1 View  08/14/2012  *RADIOLOGY REPORT*  Clinical Data: Fever  PORTABLE CHEST - 1 VIEW  Comparison: August 08, 2012  Findings:  Central catheter tip is at the junction of the superior vena cava and right atrium.  No pneumothorax.  The lungs are clear. The heart size and pulmonary vascularity are normal.  No adenopathy.  There is an old healed fracture of the left clavicle.   IMPRESSION: Lungs clear. Central catheter position unchanged.  No pneumothorax.   Original Report Authenticated By: Bretta Bang, M.D.  Dg Chest Port 1 View  08/08/2012  *RADIOLOGY REPORT*  Clinical Data: Fever  PORTABLE CHEST - 1 VIEW  Comparison: 07/31/2012  Findings: Left large bore catheter tips project over the mid/distal SVC.  Heart size upper normal to mildly enlarged.  Mild central vascular congestion. Mild peribronchial thickening. The above findings may be accentuated by hypoaeration. No confluent airspace opacity.  No pleural effusion or pneumothorax.  No acute osseous finding.  IMPRESSION: Mild peribronchial thickening is nonspecific, can be seen with bronchitis or mild edema.  No confluent airspace opacity.   Original Report Authenticated By: Jearld Lesch, M.D.    Dg Chest Port 1 View  07/31/2012  *RADIOLOGY REPORT*  Clinical Data: Evaluate for pneumothorax.  Insertion of dialysis catheter.  PORTABLE CHEST - 1 VIEW  Comparison: Chest x-ray 07/29/2012.  Findings: There is a new left internal jugular PermCath and position with the tips terminating in the right atrium and at the superior cavoatrial junction. There is a left-sided internal jugular central venous catheter with tip terminating in the distal left innominate vein.  Nasogastric tube has been withdrawn, now with tip near the gastroesophageal junction.  Lung volumes are low. No pneumothorax.  No acute consolidative airspace disease. Crowding of the pulmonary vasculature, accentuated by low lung volumes, without frank pulmonary edema.  No pleural effusions. Heart size is normal.  Mediastinal contours are unremarkable.  IMPRESSION: 1.  New left IJ PermCath appears properly located. 2.  No definite pneumothorax following dialysis catheter placement. 3.  Tip of nasogastric tube is now at the gastroesophageal junction and could be advanced at least 8 cm for more optimal placement. Additional support apparatus, as above.   Original  Report Authenticated By: Florencia Reasons, M.D.    Dg Chest Port 1 View  07/29/2012  *RADIOLOGY REPORT*  Clinical Data: Atelectasis.  PORTABLE CHEST - 1 VIEW  Comparison: 07/28/2012.  Findings: Endotracheal tube tip 1.9 cm above the carina.  Left central line tip mid superior vena cava level.  Nasogastric tube side hole just beyond the gastroesophageal junction.  No gross pneumothorax.  Mild central pulmonary vascular prominence.  No segmental consolidation.  Oblique subsegmental atelectasis right midlung.  IMPRESSION: No significant change in the subsegmental atelectasis right midlung.   Original Report Authenticated By: Fuller Canada, M.D.    Dg Chest Port 1 View  07/28/2012  *RADIOLOGY REPORT*  Clinical Data: Drug overdose, intubated  PORTABLE CHEST - 1 VIEW  Comparison:   the previous day's study  Findings: Endotracheal tube, nasogastric tube, and left IJ central line are stable in position.  Some improvement in the perihilar edema.  Linear atelectasis or early infiltrate in the right lower lung has not significantly changed.  No effusion.  Heart size normal.  IMPRESSION:  1.  Improved   perihilar opacities. 2. Support hardware stable in position.   Original Report Authenticated By: Osa Craver, M.D.    Dg Chest Port 1 View  07/27/2012  *RADIOLOGY REPORT*  Clinical Data: Evaluate endotracheal tube.  PORTABLE CHEST - 1 VIEW  Comparison: 07/26/2012  Findings: Endotracheal tube is 4.2 cm above the carina.  Central line tip in the SVC region.  Improved aeration at the left lung base.  No focal airspace disease.  Heart size is normal. Nasogastric tube in the stomach body region.  IMPRESSION: Improved aeration at the left lung base.  Support apparatuses as described.   Original Report Authenticated By: Richarda Overlie, M.D.    Dg Chest Port 1 [address]  07/26/2012  *RADIOLOGY REPORT*  Clinical Data: Atelectasis.  Shortness of breath.  PORTABLE CHEST - 1 VIEW  Comparison: Chest x-ray 07/25/2012.   Findings: An endotracheal tube is in place with tip 3.2 cm above the carina. There is a left-sided internal jugular central venous catheter with tip terminating in the superior cavoatrial junction. Lung volumes are low.  Bibasilar opacities (left greater than right), favored to represent areas of subsegmental atelectasis. Blunting of the left costophrenic sulcus favored to reflect a small left pleural effusion.  Crowding of the pulmonary vasculature, accentuated by low lung volumes.  Heart size is within normal limits. The patient is rotated to the right on today's exam, resulting in distortion of the mediastinal contours and reduced diagnostic sensitivity and specificity for mediastinal pathology.  IMPRESSION: 1.  Support apparatus, as above. 2.  Low lung volumes with worsening bibasilar atelectasis and new small left pleural effusion.   Original Report Authenticated By: Florencia Reasons, M.D.    Dg Chest Port 1 View  07/25/2012  *RADIOLOGY REPORT*  Clinical Data: Intubated patient  PORTABLE CHEST - 1 VIEW  Comparison: Chest radiograph 07/24/2012  Findings: Endotracheal tube is 2.2 cm from carina.  Left-sided central venous line is unchanged in position.  Stable cardiac silhouette.  Lungs are clear.  No pneumothorax.  IMPRESSION: Endotracheal tube 2.2 cm from carina.   Original Report Authenticated By: Genevive Bi, M.D.    Dg Chest Port 1 View  07/24/2012  *RADIOLOGY REPORT*  Clinical Data: Evaluate endotracheal tube position  PORTABLE CHEST - 1 VIEW  Comparison: Chest radiograph 07/23/2012 at 1:08 hours  Findings: Current radiograph is at 5:47 hours.  The patient is markedly rotated to the right on the first image.  A second image was performed, without significant rotation. The endotracheal tube is approximately 4 cm above the carina, and at the level of the clavicular heads.  Left IJ central venous catheter terminates in the distal superior vena cava.  Nasogastric tube courses into the stomach and  continues below the edge of the image.  Lung volumes are low.  Heart size is normal. There are bilateral hazy opacities at the lung bases and costophrenic angles, consistent with small bilateral pleural effusions. Aeration of the right upper lung field is improved compared to the chest radiograph performed 07/23/2012.  IMPRESSION: Satisfactory position of endotracheal tube.  Small bilateral pleural effusions and low lung volumes. Improved aeration of the right upper lung field compared to the 07/23/2012 chest radiograph.   Original Report Authenticated By: Britta Mccreedy, M.D.    Dg Chest Port 1 View  07/23/2012  *RADIOLOGY REPORT*  Clinical Data: Respiratory failure and overdose.  PORTABLE CHEST - 1 VIEW  Comparison: 07/22/2012  Findings: Since the prior study, the patient has been intubated with endotracheal tube tip approximately 2 cm above the carina. Left jugular central line tip lies in the lower SVC.  A nasogastric tube extends into the proximal stomach.  Lung volumes are low with interval development of mild pulmonary edema and potentially additional infiltrate in the right lung.  No significant pleural effusions are identified.  Heart size is within normal limits.  IMPRESSION: Development of mild pulmonary edema and potentially also infiltrate of the right lung.   Original Report Authenticated By: Reola Calkins, M.D.    Dg Chest Portable 1 View  07/22/2012  *RADIOLOGY REPORT*  Clinical Data: Drug overdose.  PORTABLE CHEST - 1 VIEW  Comparison: 04/08/2012  Findings: Heart size and pulmonary vascularity are normal and the  lungs are clear.  No osseous abnormality.  IMPRESSION: Normal chest.   Original Report Authenticated By: Gwynn Burly, M.D.    Dg Shoulder Right Port  07/22/2012  *RADIOLOGY REPORT*  Clinical Data: Right shoulder pain.  PORTABLE RIGHT SHOULDER - 2+ VIEW  Comparison: None.  Findings: No fracture, dislocation, or other abnormality.  IMPRESSION: Normal exam.   Original Report  Authenticated By: Gwynn Burly, M.D.          Subjective: Patient is sleeping, but awakens to answer questions. He denies any fevers, chills, chest pain, shortness breath, nausea, vomiting, diarrhea, abdominal pain  Objective: Filed Vitals:   08/15/12 0523 08/15/12 0720 08/15/12 1319 08/15/12 1716  BP: 137/80 138/87 134/67 125/82  Pulse: 93 80 78 78  Temp: 98.1 F (36.7 C) 98.8 F (37.1 C) 98.8 F (37.1 C) 99.1 F (37.3 C)  TempSrc: Oral Oral Oral Oral  Resp: 18 18 18 18   Height:      Weight:      SpO2: 99% 99% 99% 96%    Intake/Output Summary (Last 24 hours) at 08/15/12 1929 Last data filed at 08/15/12 1331  Gross per 24 hour  Intake    240 ml  Output      0 ml  Net    240 ml   Weight change:  Exam:   General:  Pt is alert, follows commands appropriately, not in acute distress  HEENT: No icterus, No thrush, No neck mass, [place]/AT  Cardiovascular: RRR, S1/S2, no rubs, no gallops  Respiratory: CTA bilaterally, no wheezing, no crackles, no rhonchi  Abdomen: Soft/+BS, non tender, non distended, no guarding  Extremities: No edema, No lymphangitis, No petechiae, No rashes, no synovitis; right upper extremity wound is wrapped--no erythema or lymphangitis streaking outside of the wound wrapping  Data Reviewed: Basic Metabolic Panel:  Lab 08/15/12 0981 08/14/12 0605 08/13/12 0650 08/12/12 0615 08/11/12 0645 08/10/12 1211  NA 136 134* 132* 135 134* --  K 4.1 4.0 3.9 4.0 4.3 --  CL 95* 92* 90* 93* 91* --  CO2 27 28 24 24 23  --  GLUCOSE 92 101* 108* 122* 113* --  BUN 31* 23 36* 29* 17 --  CREATININE 3.94* 3.97* 5.99* 6.54* 5.65* --  CALCIUM 9.2 9.7 9.4 9.5 9.7 --  MG -- -- -- -- -- --  PHOS 5.3* 4.5 5.2* 6.7* -- 7.1*   Liver Function Tests:  Lab 08/15/12 0550 08/14/12 0605 08/13/12 0650 08/12/12 0615 08/11/12 0645  AST -- -- -- -- 41*  ALT -- -- -- -- 18  ALKPHOS -- -- -- -- 99  BILITOT -- -- -- -- 0.4  PROT -- -- -- -- 7.7  ALBUMIN 3.4* 3.7 3.5 3.5 3.7    No results found for this basename: LIPASE:5,AMYLASE:5 in the last 168 hours No results found for this basename: AMMONIA:5 in the last 168 hours CBC:  Lab 08/15/12 0550 08/14/12 0605 08/13/12 0650 08/12/12 0615 08/11/12 0645  WBC 10.9* 12.1* 15.4* 13.3* 11.7*  NEUTROABS -- -- -- -- --  HGB 8.4* 8.5* 8.1* 8.5* 8.7*  HCT 25.3* 24.8* 24.4* 25.7* 26.3*  MCV 85.2 87.0 84.4 85.7 85.9  PLT 495* 531* 647* 603* 581*   Cardiac Enzymes: No results found for this basename: CKTOTAL:5,CKMB:5,CKMBINDEX:5,TROPONINI:5 in the last 168 hours BNP: No components found with this basename: POCBNP:5 CBG: No results found for this basename: GLUCAP:5 in the last 168 hours  Recent Results (from the past 240 hour(s))  CULTURE, BLOOD (ROUTINE X 2)  Status: Normal   Collection Time   08/08/12  1:40 PM      Component Value Range Status Comment   Specimen Description BLOOD LEFT HAND   Final    Special Requests BOTTLES DRAWN AEROBIC ONLY 10CC   Final    Culture  Setup Time 08/08/2012 19:01   Final    Culture NO GROWTH 5 DAYS   Final    Report Status 08/14/2012 FINAL   Final   CULTURE, BLOOD (ROUTINE X 2)     Status: Normal   Collection Time   08/08/12  1:50 PM      Component Value Range Status Comment   Specimen Description BLOOD RIGHT HAND   Final    Special Requests BOTTLES DRAWN AEROBIC ONLY 10CC   Final    Culture  Setup Time 08/08/2012 19:01   Final    Culture NO GROWTH 5 DAYS   Final    Report Status 08/14/2012 FINAL   Final   CULTURE, BLOOD (ROUTINE X 2)     Status: Normal (Preliminary result)   Collection Time   08/14/12 10:05 AM      Component Value Range Status Comment   Specimen Description BLOOD RIGHT ANTECUBITAL   Final    Special Requests BOTTLES DRAWN AEROBIC ONLY 3CC   Final    Culture  Setup Time 08/14/2012 16:51   Final    Culture     Final    Value:        BLOOD CULTURE RECEIVED NO GROWTH TO DATE CULTURE WILL BE HELD FOR 5 DAYS BEFORE ISSUING A FINAL NEGATIVE REPORT   Report  Status PENDING   Incomplete   CULTURE, BLOOD (ROUTINE X 2)     Status: Normal (Preliminary result)   Collection Time   08/14/12 10:15 AM      Component Value Range Status Comment   Specimen Description BLOOD RIGHT HAND   Final    Special Requests BOTTLES DRAWN AEROBIC ONLY 8CC   Final    Culture  Setup Time 08/14/2012 16:51   Final    Culture     Final    Value:        BLOOD CULTURE RECEIVED NO GROWTH TO DATE CULTURE WILL BE HELD FOR 5 DAYS BEFORE ISSUING A FINAL NEGATIVE REPORT   Report Status PENDING   Incomplete      Scheduled Meds:   . Chlorhexidine Gluconate Cloth  6 each Topical Q0600  . cloNIDine  0.2 mg Oral TID  . darbepoetin (ARANESP) injection - DIALYSIS  150 mcg Intravenous Q Mon-HD  . divalproex  250 mg Oral Q12H  . famotidine  20 mg Oral Daily  . feeding supplement  1 Container Oral TID BM  . ferric gluconate (FERRLECIT/NULECIT) IV  125 mg Intravenous Q M,W,F-HD  . fluconazole  100 mg Oral q1800  . LORazepam  2 mg Oral BID  . methadone  2.5 mg Oral Q12H  . multivitamin  1 tablet Oral QHS  . saccharomyces boulardii  250 mg Oral BID  . silver sulfADIAZINE   Topical Daily  . thiamine  100 mg Oral Daily   Continuous Infusions:   . sodium chloride 20 mL/hr at 08/05/12 0645  . dextrose 5 % and 0.9% NaCl 10 mL/hr at 07/29/12 0914     Dennise Bamber, DO  [facility] Pager [phone]  If 7PM-7AM, please contact night-coverage [email or web address] Password [email or web address] 08/15/2012, 7:29 PM   LOS: 24 days

## 2012-08-15 NOTE — Progress Notes (Signed)
Patient arrived for hemodialysis treatment. Patient alert to self only at this point. Patient does not answer questions appropriately and is easily agitated. Several attempts made to keep the patient calm. reassurance and support offered to patient with no improvement. Patient made several attempts to get out of bed, and was easily redirected, but improved behavior was short lived. Patient given prn Haldol, which yielded no improvement. Patient requested cigarettes and beer several times, and began to scream when his request was not granted. Scheduled Ativan and Methadone was given as ordered. Patient did show improvement after medications were given. At the time of discharge, patient was again agitated and required two staff members at his bedside to ensure patient safety. Report given to Doree Fudge, [place] 1610.

## 2012-08-15 NOTE — Progress Notes (Signed)
Clinical Social Work Department CLINICAL SOCIAL WORK PSYCHIATRY SERVICE LINE ASSESSMENT 08/15/2012  Patient:  Martin Chapman  Account:  [id number]  Admit Date:  07/22/2012  Clinical Social Worker:  Ronda Fairly, CLINICAL SOCIAL WORKER  Date/Time:  08/15/2012 12:30 PM Referred by:  Physician  Date referred:   Reason for Referral  Behavioral Health Issues   Presenting Symptoms/Problems (In the person's/family's own words):   Patient stated he was "tired and don't believe talking with another Child psychotherapist is worth my time as I am scheduled for dialysis soon." Patient reports he is "hoping to get in [facility] Rehab program, stay clean and go to meetings."   Abuse/Neglect/Trauma History (check all that apply)  Denies history   Abuse/Neglect/Trauma Comments:   Psychiatric History (check all that apply)  Denies history   Psychiatric medications:  Current Mental Health Hospitalizations/Previous Mental Health History:   Current provider:   Place and Date:   Current Medications:   Previous Impatient Admission/Date/Reason:   Emotional Health / Current Symptoms    Suicide/Self Harm  None reported   Suicide attempt in the past:   Other harmful behavior:   Psychotic/Dissociative Symptoms  None reported   Other Psychotic/Dissociative Symptoms:    Attention/Behavioral Symptoms  Inattentive   Other Attention / Behavioral Symptoms:    Cognitive Impairment  Within Normal Limits   Other Cognitive Impairment:    Mood and Adjustment  Mood Congruent  Flat    Stress, Anxiety, Trauma, Any Recent Loss/Stressor  None reported   Anxiety (frequency):   Phobia (specify):   Compulsive behavior (specify):   Obsessive behavior (specify):   Other:   Substance Abuse/Use  Current substance use  History of substance use   SBIRT completed (please refer for detailed history):  Y  Self-reported substance use:   "Last used 1/2 gram heroin on probably 10/18" Patient reports he uses  1/2 gram IV 3X weekly over the past year. Denies use of other substances.   Urinary Drug Screen Completed:  Y Alcohol level:   Positive for opiates and alcohol level was 45    Environmental/Housing/Living Arrangement  With Biological Parent(s)   Who is in the home:   Father, Waller Marcussen   Emergency contact:  Father, Bently Wyss [phone] &[phone]; and Rhoderick Moody at [phone]   Financial  Private Insurance   Patient's Strengths and Goals (patient's own words):   Patient reports that he has experienced 9 months of sobriety after being at [facility] and is willing to return to NA and possibly AA meetings once he gets stronger.  "I want to get into this Rehab Program here at [facility] and stay sober and clean by going to meetings. " Patient reports stable home to go to where he has lived with his father on and off for years.   Clinical Social Worker's Interpretive Summary:   21 YO single unemployed Caucasian male spoke with CSW after three previous attempts to have a conversation.  Patient was oriented x4 and willingly spoke about his immediate goals, desires and plans although he initially wanted to reject inquiries.  Conversation with patient included focus on both substance abuse and need for rehab in addition to discussing his previous agitation which original psych consult was for. Patient has experienced difficulty with acceptance of his current physical condition and agrees he may be unfairly taking his frustrations out on others in close proximity.  Patient expressed that he is willing to get involved again in Narcotics Anonymous and recalls a previous  period of clean time as one of the best periods of his life.  Patient recognizes the fact that staying clean will be a positive factor in his physical rehab.  patient was reluctant to talk about the dialysis treatment and shut down after stating that he had to go in for dialysis within the hour. .    Disposition:  Recommend Psych CSW continuing to support while in hospital. CSW ill provide patient  with [facility] area NA and AA schedules.

## 2012-08-15 NOTE — Evaluation (Signed)
Speech Language Pathology Evaluation Patient Details Name: Martin Chapman MRN: [id number] DOB: 05-19-1991 Today's Date: 08/15/2012 Time: 5621-3086 SLP Time Calculation (min): 26 min  Problem List:  Patient Active Problem List  Diagnosis  . Drug overdose, multiple drugs  . Acute renal failure  . Compartment syndrome of upper extremity  . Rhabdomyolysis  . Shock liver  . Aspiration into lower respiratory tract  . Postoperative respiratory failure  . C. difficile colitis  . Encephalopathy  . Anemia   Past Medical History:  Past Medical History  Diagnosis Date  . Anxiety   . Panic attacks   . Depression    Past Surgical History:  Past Surgical History  Procedure Date  . Extraction of wisdom teeth   . Intraoperative arteriogram 07/22/2012    Procedure: INTRA OPERATIVE ARTERIOGRAM;  Surgeon: Sherren Kerns, MD;  Location: [facility] OR;  Service: Vascular;  Laterality: Left;  arch aortagram, second order catheterization right subclavian artery, ultrasound left groin  . Insertion of dialysis catheter 07/22/2012    Procedure: INSERTION OF DIALYSIS CATHETER;  Surgeon: Sherren Kerns, MD;  Location: [facility] OR;  Service: Vascular;  Laterality: Right;  . Fasciotomy 07/22/2012    Procedure: FASCIOTOMY;  Surgeon: Nadara Mustard, MD;  Location: [facility] OR;  Service: Orthopedics;  Laterality: Right;  . I&d extremity 07/24/2012    Procedure: IRRIGATION AND DEBRIDEMENT EXTREMITY;  Surgeon: Nadara Mustard, MD;  Location: [facility] OR;  Service: Orthopedics;  Laterality: Right;  Right Arm Irrigation and Debridement, VAC Application  . Application of wound vac 07/24/2012    Procedure: APPLICATION OF WOUND VAC;  Surgeon: Nadara Mustard, MD;  Location: [facility] OR;  Service: Orthopedics;  Laterality: Right;  . Insertion of dialysis catheter 07/31/2012    Procedure: INSERTION OF DIALYSIS CATHETER;  Surgeon: Nada Libman, MD;  Location: [facility] OR;  Service: Vascular;  Laterality: Left;  internal jugular   HPI:  21  years old male with PMH relevant for heroin addiction, depression, anxiety and panic attacks. He overdosed on heroin and alcohol and was found unresponsive.  He was found to be hypoxic, unable to protect airway, and intubated.. The likely culprit was aspiration pneumonia/pneumonitis. He was managed with ventilatory support and antibiotics, with satisfactory ckinical response, and was successfully extubated on10/27/13. Respiratory status has remained stable since, and he is now saturating at 97%-100% on room air.  Diagnoses include: Hypovolemic/septic shock: This was evident at the time of admission, and was addressed with antibiotics, pressors and volume resuscitation. Pressors were finally weaned off on10/24/13. He did have Lactic acidosis likely secondary to shock and rhabdomyolysis, but this has since resolved.   Rhabdomyolysis/Acute renal failure: Patient was found to have elevated CK levels, consistent with rhabdomyolysis, possibly due to substance abuse, sepsis and shock. Rhabdomyolysis has resolved with iv fluids, but patient developed progressive renal failure, necessitating CVVH, then subsequently transitioned to intermittent dialysis. Perm cath placement was on 07/31/12. There appears to be no improvement in patient's renal status at this time. Managing per nephrology.   Elevated LFT's and INR: This was likely due to shock liver. Resolved as of 08/11/12.  C. Difficile infection: Patient developed diarrhea on 07/28/12, and C.Difficile PCR was positive. Managed with oral Vancomycin. Now on day #15. As patient's stools have improved, and his last dose of Zosyn was on 08/29/12. Vancomycin was discontinued on 08/11/12. Repeat C. Difficile PCR is pending. Diarrhea has resolved.  Hyperglycemia: Patient did have hyperglycemia in the initial days of hospitalization. This has resolved, and  may have been secondary to stress of acute illness.  7 Acute encephalopathy.   Assessment / Plan / Recommendation Clinical  Impression  Pt participated in cognitive-linguistic assessment.  He demonstrated adequate focused and sustained attention in a quiet, non-distracting environment; verbal recall of sequential words and problem-solving situations was normal after 2", 5", then 10" intervals.  Problem-solving for hypothetical situations was functional - responses were vague but appropriate.  Mental flexibility for complex reasoning tasks was more difficult, with pt showing poor persistance.  Verbal impulsivity noted.  Uses/understands basic humor.  Pt's symptoms appear less attributable to hypoxia than to behavioral/polysubstance hx and residual encephalopathic component, but will defer to psychiatry for diagnosis.  No formal SLP intervention warranted on acute level; pt may benefit from cognitive intervention in next venue of care.     SLP Assessment  Patient does not need any further Speech Language Pathology Services on acute care     Follow Up Recommendations  24 hour supervision/assistance       SLP Evaluation Prior Functioning  Cognitive/Linguistic Baseline: Information not available   Cognition  Overall Cognitive Status: Impaired Arousal/Alertness: Awake/alert Orientation Level: Oriented to person;Oriented to place;Oriented to situation Attention: Selective (in quiet environment with no distractions) Memory: Appears intact- verbal/basic information in controlled environment. Awareness: Appears intact Problem Solving: Appears intact - basic verbal Executive Function: Self Monitoring;Decision Making Self Monitoring: Impaired Self Monitoring Impairment: Verbal complex Behaviors: Impulsive;Poor frustration tolerance Safety/Judgment: Impaired    Comprehension  Auditory Comprehension Overall Auditory Comprehension: Appears within functional limits for tasks assessed Visual Recognition/Discrimination Discrimination: Within Function Limits    Expression Expression Primary Mode of Expression:  Verbal Verbal Expression Overall Verbal Expression: Appears within functional limits for tasks assessed   Oral / Motor Oral Motor/Sensory Function Overall Oral Motor/Sensory Function: Appears within functional limits for tasks assessed Motor Speech Overall Motor Speech: Appears within functional limits for tasks assessed   Haddon Fyfe L. Samson Frederic, [place] CCC/SLP Pager [phone]      Blenda Mounts Laurice 08/15/2012, 4:12 PM

## 2012-08-15 NOTE — Progress Notes (Signed)
Patient ID: Martin Chapman, male   DOB: [birth year], 21 y.o.   MRN: [id number] S:pt resting comfortably without new complaints O:BP 138/87  Pulse 80  Temp 98.8 F (37.1 C) (Oral)  Resp 18  Ht 5\' 7"  (1.702 m)  Wt 62.1 kg (136 lb 14.5 oz)  BMI 21.44 kg/m2  SpO2 99%  Intake/Output Summary (Last 24 hours) at 08/15/12 1211 Last data filed at 08/15/12 0835  Gross per 24 hour  Intake    300 ml  Output      0 ml  Net    300 ml   Intake/Output: I/O last 3 completed shifts: In: 840 [P.O.:840] Out: 300 [Urine:300]  Intake/Output this shift:  Total I/O In: 240 [P.O.:240] Out: -  Weight change:  Gen:WD WN WM in NAD CVS:no rub Resp:cta WUJ:WJXBJY Ext:no edema    Lab 08/15/12 0550 08/14/12 0605 08/13/12 0650 08/12/12 0615 08/11/12 0645 08/10/12 1211  NA 136 134* 132* 135 134* 133*  K 4.1 4.0 3.9 4.0 4.3 3.8  CL 95* 92* 90* 93* 91* 92*  CO2 27 28 24 24 23 25   GLUCOSE 92 101* 108* 122* 113* 112*  BUN 31* 23 36* 29* 17 39*  CREATININE 3.94* 3.97* 5.99* 6.54* 5.65* 10.12*  ALBUMIN 3.4* 3.7 3.5 3.5 3.7 3.1*  CALCIUM 9.2 9.7 9.4 9.5 9.7 8.9  PHOS 5.3* 4.5 5.2* 6.7* -- 7.1*  AST -- -- -- -- 41* --  ALT -- -- -- -- 18 --   Liver Function Tests:  Lab 08/15/12 0550 08/14/12 0605 08/13/12 0650 08/11/12 0645  AST -- -- -- 41*  ALT -- -- -- 18  ALKPHOS -- -- -- 99  BILITOT -- -- -- 0.4  PROT -- -- -- 7.7  ALBUMIN 3.4* 3.7 3.5 --   No results found for this basename: LIPASE:3,AMYLASE:3 in the last 168 hours No results found for this basename: AMMONIA:3 in the last 168 hours CBC:  Lab 08/15/12 0550 08/14/12 0605 08/13/12 0650 08/12/12 0615 08/11/12 0645  WBC 10.9* 12.1* 15.4* -- --  NEUTROABS -- -- -- -- --  HGB 8.4* 8.5* 8.1* -- --  HCT 25.3* 24.8* 24.4* -- --  MCV 85.2 87.0 84.4 85.7 85.9  PLT 495* 531* 647* -- --   Cardiac Enzymes: No results found for this basename: CKTOTAL:5,CKMB:5,CKMBINDEX:5,TROPONINI:5 in the last 168 hours CBG: No results found for this  basename: GLUCAP:5 in the last 168 hours  Iron Studies: No results found for this basename: IRON,TIBC,TRANSFERRIN,FERRITIN in the last 72 hours Studies/Results: Dg Chest Port 1 View  08/14/2012  *RADIOLOGY REPORT*  Clinical Data: Fever  PORTABLE CHEST - 1 VIEW  Comparison: August 08, 2012  Findings:  Central catheter tip is at the junction of the superior vena cava and right atrium.  No pneumothorax.  The lungs are clear. The heart size and pulmonary vascularity are normal.  No adenopathy.  There is an old healed fracture of the left clavicle.  IMPRESSION: Lungs clear. Central catheter position unchanged.  No pneumothorax.   Original Report Authenticated By: Bretta Bang, M.D.       Marland Kitchen Chlorhexidine Gluconate Cloth  6 each Topical Q0600  . cloNIDine  0.2 mg Oral TID  . darbepoetin (ARANESP) injection - DIALYSIS  150 mcg Intravenous Q Mon-HD  . divalproex  250 mg Oral Q12H  . famotidine  20 mg Oral Daily  . feeding supplement  1 Container Oral TID BM  . ferric gluconate (FERRLECIT/NULECIT) IV  125 mg Intravenous Q M,W,F-HD  .  fluconazole  100 mg Oral q1800  . LORazepam  2 mg Oral BID  . methadone  2.5 mg Oral Q12H  . multivitamin  1 tablet Oral QHS  . saccharomyces boulardii  250 mg Oral BID  . silver sulfADIAZINE   Topical Daily  . thiamine  100 mg Oral Daily  . [COMPLETED] vancomycin  1,250 mg Intravenous Once    BMET    Component Value Date/Time   NA 136 08/15/2012 0550   K 4.1 08/15/2012 0550   CL 95* 08/15/2012 0550   CO2 27 08/15/2012 0550   GLUCOSE 92 08/15/2012 0550   BUN 31* 08/15/2012 0550   CREATININE 3.94* 08/15/2012 0550   CALCIUM 9.2 08/15/2012 0550   GFRNONAA 20* 08/15/2012 0550   GFRAA 23* 08/15/2012 0550   CBC    Component Value Date/Time   WBC 10.9* 08/15/2012 0550   RBC 2.97* 08/15/2012 0550   HGB 8.4* 08/15/2012 0550   HCT 25.3* 08/15/2012 0550   PLT 495* 08/15/2012 0550   MCV 85.2 08/15/2012 0550   MCH 28.3 08/15/2012 0550   MCHC 33.2  08/15/2012 0550   RDW 13.4 08/15/2012 0550   LYMPHSABS 1.8 08/06/2012 0500   MONOABS 1.5* 08/06/2012 0500   EOSABS 0.2 08/06/2012 0500   BASOSABS 0.0 08/06/2012 0500    ASSESSMENT/PLAN:  1. AMS- ?anoxic brain injury or psychiatric illness. Would recommend Psych eval for drug rehab and also consider Neuro eval due to his AMS and impulse control issue to explore if this is related to anoxic brain injury. His major issue is related to his Psychiatric diagnosis and therefore should have an official Psych consult. 2. 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 Last HD was on 08/13/12 and his creatinine is lower today than it was post HD yesterday.  This suggests recovery of renal function.  Will cont to hold off on HD and follow UOP and daily Scr.  May be at [facility] point where he will not need outpt dialysis.   3. 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 Nutrition:not eating 6. Hypertension:stable 7. Disposition- difficult situation if he has no recovery of renal function and is 20 days out from inititiating HD on 07/23/12. 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 Will need to discuss further with SW and Case management about options. I spoke with him candidly about his addiction and he is willing to speak with [facility] Psychiat[facility] and go into [facility] drug rehab program.  Luckily it appears his renal function is returning and may not need outpt HD.  Await Psych eval. 8.   Martin Chapman

## 2012-08-15 NOTE — Progress Notes (Signed)
Physical Therapy Treatment Patient Details Name: Martin Chapman MRN: [id number] DOB: Jul 31, 1991 Today's Date: 08/15/2012 Time: 4403-4742 PT Time Calculation (min): 43 min  PT Assessment / Plan / Recommendation Comments on Treatment Session  Pt admitted with overdose, rhabdomyolysis, right UE compartment syndrome, s/p release. Pt was much more compliant during this treatment session compared to prior sessions; pt had increased energy and completed all gait tasks. Pt stated "I want to go to a rehab facility." Pt also expressed his concern for his health issues and that he "has a lot to think about." Overall, pt was not agitated or combative, but did display impulsivity and decreased safety judgement.  Concern with pt's cognitive function and impulsive behavior. Pt was able to ambulate with a functioning gait pattern, however his ability to follow multistep commands for directions within the hospital is decreased.  During stair training, pt increased his velocity up the stairs and lost his balance several times causing him to sway to one side of the steps and grab hand rail. Decreased safety awareness.  Appreciate Speech Therapy consult for cognition; he continues with extensive OT needs as well; gait function is approaching normal -- it is entirely likely his PT needs do not warrant a CIR stay; do his ST and OT needs qualify him? Is SNF still an option? Is DC straight to inpatient drug and alcohol rehab with home health OT follow-up an option?  While, it is understood that for the pt to be discharged to home with family would be near unmanageable, if he were to go home and participate in intensive outpatient drug and alcohol rehab would the situation be more manageable?    Follow Up Recommendations  See above comments on treatment session     Does the patient have the potential to tolerate intense rehabilitation    Barriers to Discharge        Equipment Recommendations         Recommendations for Other Services Speech consult requested appreciate order  Frequency Min 3X/week   Plan Frequency remains appropriate    Precautions / Restrictions Precautions Precautions: Fall Restrictions Weight Bearing Restrictions: No   Pertinent Vitals/Pain Pt stated he had a 11/10 pain level in right arm and hand (specifically digits) prior to treatment and 6/10 post treatment.        Mobility  Bed Mobility Bed Mobility: Supine to Sit;Sit to Supine Rolling Left: 5: Supervision Left Sidelying to Sit: 5: Supervision Supine to Sit: 5: Supervision Sit to Supine: 5: Supervision Transfers Transfers: Sit to Stand;Stand to Sit Sit to Stand: 5: Supervision Sit to Stand: Patient Percentage: 90% Stand to Sit: 5: Supervision Stand to Sit: Patient Percentage: 90%  Ambulation/Gait Ambulation/Gait Assistance: 5: Supervision/ min guard assist (with or without physical contact) Ambulation Distance (Feet): 1000 Feet or greater  Assistive device: None Ambulation/Gait Assistance Details: Pt ambulated approx 1069ft- rather steady gait pattern and moderate velocity; however some instances throughout the gait in which pt lost balance and would sway to either side causing him to grab on to the rail and stabalize himself.  This occurred with fatigue after walking a large distance and ascending and descending stairs. Pt slowed down when descending the stairs, however both the PT and PT student were closely guarding him.  Gait Pattern: Step-through pattern Gait velocity: Approaching normal gait velocity  General Gait Details: Pt presented a relatively normal gait pattern- some loss of balance requiring UE support on hallway rail to steady himself  Stairs: Yes Stairs Assistance: 5:  Min guard  Stairs Assistance Details (indicate cue type and reason): Pt ascended and descended a flight of steps- pt acted in an impulsive manner and began to run up the stairs with a "every other step" pattern- upon  reaching upper most step he lost his balance requiring UE assist on rail to stabilize himself.  Stair Management Technique: Other (comment) (Rails used ascending varied; rails not used descending ) Number of Stairs: 10  Wheelchair Mobility Wheelchair Mobility: No    Exercises Hand Exercises Forearm Supination: AROM (Pt is actively able to supinate- not full ROM); pt was able to slightly move his fingers and press fingertips against a pillow (pt did complain of increased pain)   PT Diagnosis:    PT Problem List:   PT Treatment Interventions:     PT Goals Acute Rehab PT Goals PT Goal Formulation: With patient Potential to Achieve Goals: Good Pt will go Supine/Side to Sit: Independently PT Goal: Supine/Side to Sit - Progress: Progressing toward goal Pt will go Sit to Stand: Independently PT Goal: Sit to Stand - Progress: Progressing toward goal Pt will go Stand to Sit: Independently PT Goal: Stand to Sit - Progress: Progressing toward goal Pt will Transfer Bed to Chair/Chair to Bed: Independently PT Transfer Goal: Bed to Chair/Chair to Bed - Progress: Progressing toward goal Pt will Ambulate: Independently PT Goal: Ambulate - Progress: Progressing toward goal  Visit Information  Last PT Received On: 08/15/12 Assistance Needed: +1    Subjective Data  Subjective: Pt stated "arm is really hurting; I do want to walk though" Patient Stated Goal: To go to a rehab facility and "get sober"   Cognition  Overall Cognitive Status: Impaired (However, pt is much more alert and responsive) Area of Impairment: Safety/judgement;Following commands Arousal/Alertness: Awake/alert Orientation Level: Oriented X4 / Intact Behavior During Session: [facility] for tasks performed Current Attention Level: Selective Memory: Decreased recall of precautions Following Commands: Follows multi-step commands inconsistently Safety/Judgement: Decreased safety judgement for tasks assessed;Impulsive Awareness of  Errors: Assistance required to identify errors made   Required reminder cues to look for stairwell, required path finding cues to find his room, pt was cued to lok at signs and still didn't, is pt using behavior/attitude to potentially mask cognitive deficits from his encephalopathy?    Balance  Balance Balance Assessed: No Static Standing Balance Static Standing - Level of Assistance: 5: Stand by assistance  End of Session PT - End of Session Activity Tolerance: Patient tolerated treatment well Patient left: in bed;with call bell/phone within reach Nurse Communication: Mobility status   GP     Van Clines [facility] [place], [place] [phone]  08/15/2012, 1:06 PM

## 2012-08-16 LAB — CBC WITH DIFFERENTIAL/PLATELET
Eosinophils Relative: 2 % (ref 0–5)
HCT: 25.9 % — ABNORMAL LOW (ref 39.0–52.0)
Hemoglobin: 8.6 g/dL — ABNORMAL LOW (ref 13.0–17.0)
Lymphocytes Relative: 20 % (ref 12–46)
Lymphs Abs: 2.1 10*3/uL (ref 0.7–4.0)
MCV: 86.6 fL (ref 78.0–100.0)
Monocytes Absolute: 1.6 10*3/uL — ABNORMAL HIGH (ref 0.1–1.0)
Monocytes Relative: 15 % — ABNORMAL HIGH (ref 3–12)
Neutro Abs: 6.7 10*3/uL (ref 1.7–7.7)
RBC: 2.99 MIL/uL — ABNORMAL LOW (ref 4.22–5.81)
WBC: 10.6 10*3/uL — ABNORMAL HIGH (ref 4.0–10.5)

## 2012-08-16 LAB — RENAL FUNCTION PANEL
BUN: 38 mg/dL — ABNORMAL HIGH (ref 6–23)
CO2: 27 mEq/L (ref 19–32)
Glucose, Bld: 96 mg/dL (ref 70–99)
Phosphorus: 5.3 mg/dL — ABNORMAL HIGH (ref 2.3–4.6)
Potassium: 4.2 mEq/L (ref 3.5–5.1)

## 2012-08-16 LAB — URINE CULTURE: Colony Count: NO GROWTH

## 2012-08-16 NOTE — Progress Notes (Signed)
Physical Therapy Treatment Patient Details Name: Martin Chapman MRN: [id number] DOB: 1991/06/19 Today's Date: 08/16/2012 Time: 4098-1191 PT Time Calculation (min): 26 min  PT Assessment / Plan / Recommendation Comments on Treatment Session   Pt admitted with overdose, rhabdomyolysis, right UE compartment syndrome, s/p release. During pt's stay, history of behavioral problems requiring the use of restraints and a sitter. Pt's agitation and impulsivity has decreased over past 2 days. Pt was very compliant during PT session, less impulsive behavior noted. Pt was not agitated or combative and displayed increased safety awareness. During ambulation pt began to run but when told to stop he followed this command.   Pt was able to ambulate with a functioning gait pattern, following one step commands consistently; pt continues to struggle with following multistep commands. Pt performed a deep squat when picking up an item from the floor without a LOB. Pt demonstrated improved ROM in right arm- increased elbow extension, performed elbow flexion in neutral hand position, and abduction of right arm. Displayed increased use of UE to assist in ADL tasks- able to open a soda can with the use of left UE and hold a trash bag to "clean" his room. Pt also did not lay on right arm when returning to his bed.   No further PT needs have been identified. Will sign off. Pt continues with extensive OT needs for right UE function and psych needs for substance abuse.  Will he be able to go to inpatient drug and alcohol rehab with home health OT follow-up or is DC home an option?    Follow Up Recommendations  Continues to need OT and drug alcohol rehab.     Does the patient have the potential to tolerate intense rehabilitation    Barriers to Discharge        Equipment Recommendations       Recommendations for Other Services   OT and drug alcohol rehab  Frequency     Plan All goals met and education  completed, patient dischaged from PT services    Precautions / Restrictions Precautions Precautions: Fall Restrictions Weight Bearing Restrictions: No   Pertinent Vitals/Pain Pt stated "arm is killing me" prior to PT treatment; post PT treatment "30"/10 pain level.  Notified RN pt was requesting pain meds.    Mobility  Bed Mobility Bed Mobility: Supine to Sit;Sit to Supine Rolling Left: 7: Independent Left Sidelying to Sit: 7: Independent Supine to Sit: 7: Independent Sitting - Scoot to Edge of Bed: 7: Independent Sit to Supine: 7: Independent Details for Bed Mobility Assistance: Smooth motion transferred relatively well Transfers Transfers: Stand to Sit;Sit to Stand Sit to Stand: 7: Independent Stand to Sit: 7: Independent Details for Transfer Assistance: Smooth transition no noted LOB at initial stand Ambulation/Gait Ambulation/Gait Assistance: 7: Independent Ambulation Distance (Feet): 1000 Feet Assistive device: None Ambulation/Gait Assistance Details: Functional gait pattern approaching WNL; no LOB noted today Gait Pattern: Step-through pattern;Within Functional Limits Gait velocity: Moderate-normal velocity General Gait Details: Pt presented a relatively normal gait pattern- no significent LOB noted Stairs: No    Exercises     PT Diagnosis:    PT Problem List:   PT Treatment Interventions:     PT Goals Acute Rehab PT Goals PT Goal Formulation: With patient Time For Goal Achievement: 08/20/12 Potential to Achieve Goals: Good Pt will go Supine/Side to Sit: Independently PT Goal: Supine/Side to Sit - Progress: Met Pt will go Sit to Stand: Independently PT Goal: Sit to Stand - Progress: Met  Pt will go Stand to Sit: Independently PT Goal: Stand to Sit - Progress: Met Pt will Transfer Bed to Chair/Chair to Bed: Independently PT Transfer Goal: Bed to Chair/Chair to Bed - Progress: Met Pt will Ambulate: Independently PT Goal: Ambulate - Progress: Met  Visit  Information  Last PT Received On: 08/16/12 Assistance Needed: +1    Subjective Data  Subjective: Pt stated "right arm still hurts but would still like to walk" Patient Stated Goal: Pt continues to state his goal is to leave the floor and enter a rehab facility   Cognition  Overall Cognitive Status: Appears within functional limits for tasks assessed/performed (Performed all PT tasks; still safety awareness issues) Area of Impairment: Memory;Safety/judgement Arousal/Alertness: Awake/alert Orientation Level: Oriented X4 / Intact Behavior During Session: WFL for tasks performed Current Attention Level: Sustained Memory: Decreased recall of precautions Memory Deficits: Decreased recall of tasks performed during previous PT session Following Commands: Follows one step commands consistently Safety/Judgement: Impulsive;Decreased awareness of safety precautions Awareness of Errors: Assistance required to correct errors made Executive Functioning: Some noted poor impulse control Cognition - Other Comments: Inappropriate language    Balance  Balance Balance Assessed: No Static Standing Balance Static Standing - Balance Support: No upper extremity supported;During functional activity Static Standing - Level of Assistance: 5: Stand by assistance Dynamic Standing Balance Dynamic Standing - Balance Support: During functional activity;No upper extremity supported Dynamic Standing - Level of Assistance: 5: Stand by assistance  End of Session PT - End of Session Activity Tolerance: Patient tolerated treatment well Patient left: with call bell/phone within reach Nurse Communication: Mobility status   GP     Van Clines [facility] [place], [place] [phone]  08/16/2012, 5:01 PM

## 2012-08-16 NOTE — Progress Notes (Signed)
Writer spoke with Martin Chapman's father, Jonni Sanger, who agreed to met with staff at 4:30 PM.  Meeting began soon after Martin Chapman's father arrived at [facility].  Clinical research associate and [address] CSW, Genelle Bal, meet briefly with patient's father and patient. Martin Chapman initially refused, but need for discussion re discharge plan was stressed and patient agreed to five minutes. Dicussion included potential scenarios for discharge including inpatient/outpatient SA treatment (Martin Chapman stated he only needs AA, NA and people at [facility]) [employer] living (Martin Chapman stated "desire for [facility] following two weeks at [employer] house to get feeling better"). Patient ended conversation that he deemed had gone on too long by deflecting his anger onto others and ultimately walking out of room.  Conversation with father moved to mini conf rm and included patient's recent progress with both physical therapy and dialysis. Martin Chapman's father expressed concern regarding Martin Chapman's report that he has not eaten in 3 days.  Father received call from a friend Annitta Needs whom he put on speaker phone. Weston Brass encouraged inpatient treatment for patient and outside supports for family.  The friend is also a Academic librarian and was firm in his recommendation to discount patient going home as an option.   Writer approached Martin Chapman's nurse, Mindy, to address medical concerns and found Martin Chapman's mother, Almira Coaster, and sister, Wyn Forster, along w her young daughter in patient's room. Sherron Monday, and later nurse, Mindy, joined discussion in mini conference room. Almira Coaster shared that Martin Chapman has been in multiple treatment centers, she has restraining order preventing patient from coming to her home and although she carries insurance on patient he will need to be responsible for any co pays regarding treatment.  Martin Chapman's sister's inquiry regarding involuntary treatment was addressed; as well as patient's mental status, planned discharge of permanent catheter and feeding issues.  Both Gina and Madison encouraged patient's  father to set firm limits with patient.  All agreed to make inpatient treatment his only option. Family was given csw's contact information.   Carney Bern, LCSWA Clinical Social Worker [phone]

## 2012-08-16 NOTE — Progress Notes (Signed)
Patient ID: Martin Chapman, male   DOB: 07/03/1991, 21 y.o.   MRN: [id number] S:Pt more awake and alert, complaining of arm pain O:BP 136/86  Pulse 71  Temp 97.9 F (36.6 C) (Oral)  Resp 18  Ht 5\' 7"  (1.702 m)  Wt 62.1 kg (136 lb 14.5 oz)  BMI 21.44 kg/m2  SpO2 97%  Intake/Output Summary (Last 24 hours) at 08/16/12 0905 Last data filed at 08/16/12 0436  Gross per 24 hour  Intake    840 ml  Output    200 ml  Net    640 ml   Intake/Output: I/O last 3 completed shifts: In: 1140 [P.O.:1140] Out: 200 [Urine:200]  Intake/Output this shift:    Weight change:  Gen:WD WN WM in NAD CVS:no rub Resp:CTA WUJ:[id number] Ext:no edema   Lab 08/16/12 0545 08/15/12 0550 08/14/12 0605 08/13/12 0650 08/12/12 0615 08/11/12 0645 08/10/12 1211  NA 133* 136 134* 132* 135 134* 133*  K 4.2 4.1 4.0 3.9 4.0 4.3 3.8  CL 91* 95* 92* 90* 93* 91* 92*  CO2 27 27 28 24 24 23 25   GLUCOSE 96 92 101* 108* 122* 113* 112*  BUN 38* 31* 23 36* 29* 17 39*  CREATININE 3.75* 3.94* 3.97* 5.99* 6.54* 5.65* 10.12*  ALBUMIN 3.4* 3.4* 3.7 3.5 3.5 3.7 3.1*  CALCIUM 9.3 9.2 9.7 9.4 9.5 9.7 8.9  PHOS 5.3* 5.3* 4.5 5.2* 6.7* -- 7.1*  AST -- -- -- -- -- 41* --  ALT -- -- -- -- -- 18 --   Liver Function Tests:  Lab 08/16/12 0545 08/15/12 0550 08/14/12 0605 08/11/12 0645  AST -- -- -- 41*  ALT -- -- -- 18  ALKPHOS -- -- -- 99  BILITOT -- -- -- 0.4  PROT -- -- -- 7.7  ALBUMIN 3.4* 3.4* 3.7 --   No results found for this basename: LIPASE:3,AMYLASE:3 in the last 168 hours No results found for this basename: AMMONIA:3 in the last 168 hours CBC:  Lab 08/16/12 0545 08/15/12 0550 08/14/12 0605 08/13/12 0650 08/12/12 0615  WBC 10.6* 10.9* 12.1* -- --  NEUTROABS 6.7 -- -- -- --  HGB 8.6* 8.4* 8.5* -- --  HCT 25.9* 25.3* 24.8* -- --  MCV 86.6 85.2 87.0 84.4 85.7  PLT 515* 495* 531* -- --   Cardiac Enzymes: No results found for this basename: CKTOTAL:5,CKMB:5,CKMBINDEX:5,TROPONINI:5 in the last 168  hours CBG: No results found for this basename: GLUCAP:5 in the last 168 hours  Iron Studies: No results found for this basename: IRON,TIBC,TRANSFERRIN,FERRITIN in the last 72 hours Studies/Results: Dg Chest Port 1 View  08/14/2012  *RADIOLOGY REPORT*  Clinical Data: Fever  PORTABLE CHEST - 1 VIEW  Comparison: August 08, 2012  Findings:  Central catheter tip is at the junction of the superior vena cava and right atrium.  No pneumothorax.  The lungs are clear. The heart size and pulmonary vascularity are normal.  No adenopathy.  There is an old healed fracture of the left clavicle.  IMPRESSION: Lungs clear. Central catheter position unchanged.  No pneumothorax.   Original Report Authenticated By: Bretta Bang, M.D.       Marland Kitchen Chlorhexidine Gluconate Cloth  6 each Topical Q0600  . cloNIDine  0.2 mg Oral TID  . darbepoetin (ARANESP) injection - DIALYSIS  150 mcg Intravenous Q Mon-HD  . divalproex  250 mg Oral Q12H  . famotidine  20 mg Oral Daily  . feeding supplement  1 Container Oral TID BM  . ferric gluconate (FERRLECIT/NULECIT)  IV  125 mg Intravenous Q M,W,F-HD  . fluconazole  100 mg Oral q1800  . LORazepam  2 mg Oral BID  . methadone  2.5 mg Oral Q12H  . multivitamin  1 tablet Oral QHS  . saccharomyces boulardii  250 mg Oral BID  . silver sulfADIAZINE   Topical Daily  . thiamine  100 mg Oral Daily    BMET    Component Value Date/Time   NA 133* 08/16/2012 0545   K 4.2 08/16/2012 0545   CL 91* 08/16/2012 0545   CO2 27 08/16/2012 0545   GLUCOSE 96 08/16/2012 0545   BUN 38* 08/16/2012 0545   CREATININE 3.75* 08/16/2012 0545   CALCIUM 9.3 08/16/2012 0545   GFRNONAA 21* 08/16/2012 0545   GFRAA 25* 08/16/2012 0545   CBC    Component Value Date/Time   WBC 10.6* 08/16/2012 0545   RBC 2.99* 08/16/2012 0545   HGB 8.6* 08/16/2012 0545   HCT 25.9* 08/16/2012 0545   PLT 515* 08/16/2012 0545   MCV 86.6 08/16/2012 0545   MCH 28.8 08/16/2012 0545   MCHC 33.2 08/16/2012 0545    RDW 13.8 08/16/2012 0545   LYMPHSABS 2.1 08/16/2012 0545   MONOABS 1.6* 08/16/2012 0545   EOSABS 0.2 08/16/2012 0545   BASOSABS 0.1 08/16/2012 0545    ASSESSMENT/PLAN:  1. AMS- ?anoxic brain injury or psychiatric illness. Would recommend Psych eval for drug rehab and also consider Neuro eval due to his AMS and impulse control issue to explore if this is related to anoxic brain injury. His major issue is related to his Psychiatric diagnosis and therefore should have an official Psych consult. 2. HD-dependent ARF-improving creatinine.  Last HD was on 08/13/12.   Appears he is having recovery of renal function. Will plan to d/c permcath tomorrow. 3. 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 5. CKD-MBD:resolved rhabdo 6. Nutrition:not eating 7. Hypertension:stable 8. Disposition- difficult situation if he has no recovery of renal function and is 20 days out from inititiating HD on 07/23/12. 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 Will need to discuss further with SW and Case management about options. I spoke with him candidly about his addiction and he is willing to speak with a Psychiatrist and go into a drug rehab program. Luckily it appears his renal function is returning and will not need outpt HD. Await Psych eval.  Ionia Schey A

## 2012-08-16 NOTE — Progress Notes (Signed)
Rehab admission - I spoke with PT yesterday.  Patient is making a lot of progress and is now doing too well to meet criteria for an acute inpatient rehab admission.  Options now might be SNF level therapies or could pursue inpatient drug rehab program.  I will sign off for inpatient rehab.  #[phone]

## 2012-08-16 NOTE — Progress Notes (Signed)
[facility] PROGRESS NOTE  Martin Chapman AVW:[id number] DOB: Jan 17, 1991 DOA: 07/22/2012 PCP: No primary provider on file.  Assessment/Plan: 1. Acute hypoxic respiratory failure:  -Secondary to heroin overdose and alcohol ingestion -Likely a component of aspiration pneumonitis -Clinically stable on room air at this time  2. Hypovolemic/septic shock:  -lactic acidosis, hypotension, and rhabdomyolysis have improved  -Remains hemodynamically stable 3. Rhabdomyolysis/Acute renal failure:  -Rhabdomyolysis improving with intravenous fluids  -Renal function continues to improve off of dialysis  -Last dialysis 08/13/2012  4. Elevated LFT's and INR: This was likely due to shock liver. Resolved as of 08/11/12.  5. C. Difficile infection:  -Treated with 14 days of oral vancomycin -Resolved 6. Hyperglycemia:  -Resolved 7. Acute encephalopathy: This was toxic/metabolic. There may also be an element of anoxic brain damage. EEG revealed findings consistent with mild encephalopathy, and brain MRI brain was negative for acute pathology. B12, Thiamine, ammonia, TSH, all within normal limits.HIV test and RPR were negative. Mental status is close to new baseline at this time, although patient occasionally appears disoriented and forgetful. He has had some periods of severe agitation on 08/13/12, requiring 1:1 Sitter and restraints.  -Lumbar puncture was negative for infectious etiology -Original Psychiatrist consultation seen on 08/14/2012 by Dr.Jonnalagadda  --> Recommended Haldol and Ativan when necessary agitation  -Will need referrals to outpatient alcoholic and Narcotics Anonymous  -Will reconsult psychiatry for capacity evaluation and referral to drug rehabilitation 8. Right upper extremity injury with compartment syndrome and possible vascular injury: Dr Aldean Baker was consulted for management, and performed surgery with shoulder release and fasciotomy of right arm on 07/22/12, followed by  excision of biceps muscle right arm, neurolysis median nerve, secondary closure of triceps and biceps incision, with application of wound vac on 07/24/12. Arch aortogram in OR normal. No vascular deficit in RUE/shoulder. Managing per Dr. Lajoyce Corners and wound care. Wound vac has since been discontinued, and wound is healing well.  9. Oral thrush:  -Improved 10. HTN: Patient's BP has been persistently elevated, since resolution of shock, in spite of hemodialysis. Adjusting Clonidine as indicated, with improvement.  11. Pyrexia:  -No fevers in the last 48 hours  12. Metabolic encephalopathy  -Speech therapy evaluation for cognitive linguistic assessment revealedHe demonstrated adequate focused and sustained attention in a quiet, non-distracting environment; verbal recall of sequential words and problem-solving situations was normal after 2", 5", then 10" intervals. Problem-solving for hypothetical situations was functional - responses were vague but appropriate. Mental flexibility for complex reasoning tasks was more difficult, with pt showing poor persistance. Verbal impulsivity noted. Uses/understands basic humor. Pt's symptoms appear less attributable to hypoxia than to behavioral/polysubstance hx and residual encephalopathic component, but will defer to psychiatry for diagnosis. No formal SLP intervention warranted on acute level -MRI brain negative for signs of hypoxemic encephalopathy--> shows premature cerebellar and cerebral atrophy     Disposition Plan:   Drug rehab when medically stable      Procedures/Studies: [name] Contrast  08/04/2012  *RADIOLOGY REPORT*  Clinical Data: Polysubstance abuse.  Altered mental status.  Renal failure.  MRI HEAD WITHOUT CONTRAST  Technique:  Multiplanar, multiecho pulse sequences of the brain and surrounding structures were obtained according to standard protocol without intravenous contrast.  Comparison: CT head 04/08/2012 (normal)  Findings: The patient  had difficulty remaining motionless for the study.  Images are suboptimal.  Small or subtle lesions could be overlooked.  There is no evidence for acute infarction, intracranial hemorrhage, mass lesion, hydrocephalus, or extra-axial fluid.  There may be mild premature cerebral and cerebellar atrophy for age.  There is no visible white matter disease.  No evidence for hypoxic ischemic encephalopathy.  No basal ganglia T1 shortening to suggest early hepatic encephalopathy.  Normal pituitary and cerebellar tonsils. Major intracranial flow voids are preserved.  Chronic sinus disease right division sphenoid sinus.  Slight dependent mastoid fluid bilaterally.  Because of renal failure, Gadolinium contrast could not be administered.  Meningeal enhancement or early changes of meningoencephalitis could be inapparent on this noncontrast examination.  IMPRESSION: Except for mild premature cerebral and cerebellar atrophy, there is no visible acute intracranial abnormality.  No signs of hypoxic ischemic encephalopathy or acute/subacute infarction.  No foci of chronic hemorrhage.  No evidence for hepatic encephalopathy metabolic changes.   Original Report Authenticated By: Davonna Belling, M.D.    US Renal  07/23/2012  *RADIOLOGY REPORT*  Clinical Data: Acute renal failure  RENAL/URINARY TRACT ULTRASOUND COMPLETE  Comparison:  CT abdomen pelvis - 04/08/2012; chest radiograph - 07/23/2012  Findings:  Right Kidney:  Normal cortical thickness, echogenicity and size, measuring 13.2 cm in length.  There is a minimal amount of perinephric fluid.  No focal renal lesions.  No echogenic renal stones.  No urinary obstruction.  Left Kidney:  Normal cortical thickness, echogenicity and size, measuring 13.8 cm in length. There is a small amount of perirenal fluid.  No focal renal lesions.  No echogenic renal stones.  No urinary obstruction.  Bladder:  Decompressed with a Foley catheter  Small amount of ascites is noted within the pelvis.   There is a minimal amount of perisplenic fluid.  Small left-sided pleural effusion is incidentally noted  IMPRESSION: 1.  No sonographic explanation for patient's acute renal failure. Specifically, no evidence of urinary obstruction. 2.  Findings suggestive of volume overload/third spacing including a minimal amount of ascites, small left-sided pleural effusion and small amount of perirenal fluid, nonspecific but possibly attributable to reported history of acute renal failure.   Original Report Authenticated By: Waynard Reeds, M.D.    Dg Chest Port 1 View  08/14/2012  *RADIOLOGY REPORT*  Clinical Data: Fever  PORTABLE CHEST - 1 VIEW  Comparison: August 08, 2012  Findings:  Central catheter tip is at the junction of the superior vena cava and right atrium.  No pneumothorax.  The lungs are clear. The heart size and pulmonary vascularity are normal.  No adenopathy.  There is an old healed fracture of the left clavicle.  IMPRESSION: Lungs clear. Central catheter position unchanged.  No pneumothorax.   Original Report Authenticated By: Bretta Bang, M.D.    Dg Chest Port 1 View  08/08/2012  *RADIOLOGY REPORT*  Clinical Data: Fever  PORTABLE CHEST - 1 VIEW  Comparison: 07/31/2012  Findings: Left large bore catheter tips project over the mid/distal SVC.  Heart size upper normal to mildly enlarged.  Mild central vascular congestion. Mild peribronchial thickening. The above findings may be accentuated by hypoaeration. No confluent airspace opacity.  No pleural effusion or pneumothorax.  No acute osseous finding.  IMPRESSION: Mild peribronchial thickening is nonspecific, can be seen with bronchitis or mild edema.  No confluent airspace opacity.   Original Report Authenticated By: Jearld Lesch, M.D.    Dg Chest Port 1 View  07/31/2012  *RADIOLOGY REPORT*  Clinical Data: Evaluate for pneumothorax.  Insertion of dialysis catheter.  PORTABLE CHEST - 1 VIEW  Comparison: Chest x-ray 07/29/2012.  Findings:  There is a new left internal jugular PermCath and position  with the tips terminating in the right atrium and at the superior cavoatrial junction. There is a left-sided internal jugular central venous catheter with tip terminating in the distal left innominate vein.  Nasogastric tube has been withdrawn, now with tip near the gastroesophageal junction.  Lung volumes are low. No pneumothorax.  No acute consolidative airspace disease. Crowding of the pulmonary vasculature, accentuated by low lung volumes, without frank pulmonary edema.  No pleural effusions. Heart size is normal.  Mediastinal contours are unremarkable.  IMPRESSION: 1.  New left IJ PermCath appears properly located. 2.  No definite pneumothorax following dialysis catheter placement. 3.  Tip of nasogastric tube is now at the gastroesophageal junction and could be advanced at least 8 cm for more optimal placement. Additional support apparatus, as above.   Original Report Authenticated By: Florencia Reasons, M.D.    Dg Chest Port 1 View  07/29/2012  *RADIOLOGY REPORT*  Clinical Data: Atelectasis.  PORTABLE CHEST - 1 VIEW  Comparison: 07/28/2012.  Findings: Endotracheal tube tip 1.9 cm above the carina.  Left central line tip mid superior vena cava level.  Nasogastric tube side hole just beyond the gastroesophageal junction.  No gross pneumothorax.  Mild central pulmonary vascular prominence.  No segmental consolidation.  Oblique subsegmental atelectasis right midlung.  IMPRESSION: No significant change in the subsegmental atelectasis right midlung.   Original Report Authenticated By: Fuller Canada, M.D.    Dg Chest Port 1 View  07/28/2012  *RADIOLOGY REPORT*  Clinical Data: Drug overdose, intubated  PORTABLE CHEST - 1 VIEW  Comparison:   the previous day's study  Findings: Endotracheal tube, nasogastric tube, and left IJ central line are stable in position.  Some improvement in the perihilar edema.  Linear atelectasis or early infiltrate in the  right lower lung has not significantly changed.  No effusion.  Heart size normal.  IMPRESSION:  1.  Improved   perihilar opacities. 2. Support hardware stable in position.   Original Report Authenticated By: Osa Craver, M.D.    Dg Chest Port 1 View  07/27/2012  *RADIOLOGY REPORT*  Clinical Data: Evaluate endotracheal tube.  PORTABLE CHEST - 1 VIEW  Comparison: 07/26/2012  Findings: Endotracheal tube is 4.2 cm above the carina.  Central line tip in the SVC region.  Improved aeration at the left lung base.  No focal airspace disease.  Heart size is normal. Nasogastric tube in the stomach body region.  IMPRESSION: Improved aeration at the left lung base.  Support apparatuses as described.   Original Report Authenticated By: Richarda Overlie, M.D.    Dg Chest Port 1 View  07/26/2012  *RADIOLOGY REPORT*  Clinical Data: Atelectasis.  Shortness of breath.  PORTABLE CHEST - 1 VIEW  Comparison: Chest x-ray 07/25/2012.  Findings: An endotracheal tube is in place with tip 3.2 cm above the carina. There is a left-sided internal jugular central venous catheter with tip terminating in the superior cavoatrial junction. Lung volumes are low.  Bibasilar opacities (left greater than right), favored to represent areas of subsegmental atelectasis. Blunting of the left costophrenic sulcus favored to reflect a small left pleural effusion.  Crowding of the pulmonary vasculature, accentuated by low lung volumes.  Heart size is within normal limits. The patient is rotated to the right on today's exam, resulting in distortion of the mediastinal contours and reduced diagnostic sensitivity and specificity for mediastinal pathology.  IMPRESSION: 1.  Support apparatus, as above. 2.  Low lung volumes with worsening bibasilar atelectasis and new small  left pleural effusion.   Original Report Authenticated By: Florencia Reasons, M.D.    Dg Chest Port 1 View  07/25/2012  *RADIOLOGY REPORT*  Clinical Data: Intubated patient  PORTABLE  CHEST - 1 VIEW  Comparison: Chest radiograph 07/24/2012  Findings: Endotracheal tube is 2.2 cm from carina.  Left-sided central venous line is unchanged in position.  Stable cardiac silhouette.  Lungs are clear.  No pneumothorax.  IMPRESSION: Endotracheal tube 2.2 cm from carina.   Original Report Authenticated By: Genevive Bi, M.D.    Dg Chest Port 1 View  07/24/2012  *RADIOLOGY REPORT*  Clinical Data: Evaluate endotracheal tube position  PORTABLE CHEST - 1 VIEW  Comparison: Chest radiograph 07/23/2012 at 1:08 hours  Findings: Current radiograph is at 5:47 hours.  The patient is markedly rotated to the right on the first image.  A second image was performed, without significant rotation. The endotracheal tube is approximately 4 cm above the carina, and at the level of the clavicular heads.  Left IJ central venous catheter terminates in the distal superior vena cava.  Nasogastric tube courses into the stomach and continues below the edge of the image.  Lung volumes are low.  Heart size is normal. There are bilateral hazy opacities at the lung bases and costophrenic angles, consistent with small bilateral pleural effusions. Aeration of the right upper lung field is improved compared to the chest radiograph performed 07/23/2012.  IMPRESSION: Satisfactory position of endotracheal tube.  Small bilateral pleural effusions and low lung volumes. Improved aeration of the right upper lung field compared to the 07/23/2012 chest radiograph.   Original Report Authenticated By: Britta Mccreedy, M.D.    Dg Chest Port 1 View  07/23/2012  *RADIOLOGY REPORT*  Clinical Data: Respiratory failure and overdose.  PORTABLE CHEST - 1 VIEW  Comparison: 07/22/2012  Findings: Since the prior study, the patient has been intubated with endotracheal tube tip approximately 2 cm above the carina. Left jugular central line tip lies in the lower SVC.  A nasogastric tube extends into the proximal stomach.  Lung volumes are low with interval  development of mild pulmonary edema and potentially additional infiltrate in the right lung.  No significant pleural effusions are identified.  Heart size is within normal limits.  IMPRESSION: Development of mild pulmonary edema and potentially also infiltrate of the right lung.   Original Report Authenticated By: Reola Calkins, M.D.    Dg Chest Portable 1 View  07/22/2012  *RADIOLOGY REPORT*  Clinical Data: Drug overdose.  PORTABLE CHEST - 1 VIEW  Comparison: 04/08/2012  Findings: Heart size and pulmonary vascularity are normal and the lungs are clear.  No osseous abnormality.  IMPRESSION: Normal chest.   Original Report Authenticated By: Gwynn Burly, M.D.    Dg Shoulder Right Port  07/22/2012  *RADIOLOGY REPORT*  Clinical Data: Right shoulder pain.  PORTABLE RIGHT SHOULDER - 2+ VIEW  Comparison: None.  Findings: No fracture, dislocation, or other abnormality.  IMPRESSION: Normal exam.   Original Report Authenticated By: Gwynn Burly, M.D.          Subjective: Patient told me he was willing to go to drug rehabilitation at [facility]. Denies fevers, chills, chest pain, shortness of breath, nausea, vomiting, diarrhea, abdominal pain, dysuria.  Objective: Filed Vitals:   08/16/12 0434 08/16/12 1010 08/16/12 1406 08/16/12 1700  BP: 136/86 115/76 125/86 116/76  Pulse: 71 83 72 74  Temp: 97.9 F (36.6 C) 97.1 F (36.2 C) 98.1 F (36.7 C) 97.4 F (36.3  C)  TempSrc: Oral   Oral  Resp: 18 17 18    Height:      Weight:      SpO2: 97% 97% 98%     Intake/Output Summary (Last 24 hours) at 08/16/12 1901 Last data filed at 08/16/12 1407  Gross per 24 hour  Intake   1200 ml  Output    775 ml  Net    425 ml   Weight change:  Exam:   General:  Pt is alert, follows commands appropriately, not in acute distress  HEENT: No icterus, No thrush,  [place]/AT  Cardiovascular: RRR, S1/S2, no rubs, no gallops  Respiratory: CTA bilaterally, no wheezing, no crackles, no  rhonchi  Abdomen: Soft/+BS, non tender, non distended, no guarding  Extremities: No edema, No lymphangitis, No petechiae, No rashes, no synovitis  Data Reviewed: Basic Metabolic Panel:  Lab 08/16/12 5784 08/15/12 0550 08/14/12 0605 08/13/12 0650 08/12/12 0615  NA 133* 136 134* 132* 135  K 4.2 4.1 4.0 3.9 4.0  CL 91* 95* 92* 90* 93*  CO2 27 27 28 24 24   GLUCOSE 96 92 101* 108* 122*  BUN 38* 31* 23 36* 29*  CREATININE 3.75* 3.94* 3.97* 5.99* 6.54*  CALCIUM 9.3 9.2 9.7 9.4 9.5  MG -- -- -- -- --  PHOS 5.3* 5.3* 4.5 5.2* 6.7*   Liver Function Tests:  Lab 08/16/12 0545 08/15/12 0550 08/14/12 0605 08/13/12 0650 08/12/12 0615 08/11/12 0645  AST -- -- -- -- -- 41*  ALT -- -- -- -- -- 18  ALKPHOS -- -- -- -- -- 99  BILITOT -- -- -- -- -- 0.4  PROT -- -- -- -- -- 7.7  ALBUMIN 3.4* 3.4* 3.7 3.5 3.5 --   No results found for this basename: LIPASE:5,AMYLASE:5 in the last 168 hours No results found for this basename: AMMONIA:5 in the last 168 hours CBC:  Lab 08/16/12 0545 08/15/12 0550 08/14/12 0605 08/13/12 0650 08/12/12 0615  WBC 10.6* 10.9* 12.1* 15.4* 13.3*  NEUTROABS 6.7 -- -- -- --  HGB 8.6* 8.4* 8.5* 8.1* 8.5*  HCT 25.9* 25.3* 24.8* 24.4* 25.7*  MCV 86.6 85.2 87.0 84.4 85.7  PLT 515* 495* 531* 647* 603*   Cardiac Enzymes: No results found for this basename: CKTOTAL:5,CKMB:5,CKMBINDEX:5,TROPONINI:5 in the last 168 hours BNP: No components found with this basename: POCBNP:5 CBG: No results found for this basename: GLUCAP:5 in the last 168 hours  Recent Results (from the past 240 hour(s))  CULTURE, BLOOD (ROUTINE X 2)     Status: Normal   Collection Time   08/08/12  1:40 PM      Component Value Range Status Comment   Specimen Description BLOOD LEFT HAND   Final    Special Requests BOTTLES DRAWN AEROBIC ONLY 10CC   Final    Culture  Setup Time 08/08/2012 19:01   Final    Culture NO GROWTH 5 DAYS   Final    Report Status 08/14/2012 FINAL   Final   CULTURE, BLOOD  (ROUTINE X 2)     Status: Normal   Collection Time   08/08/12  1:50 PM      Component Value Range Status Comment   Specimen Description BLOOD RIGHT HAND   Final    Special Requests BOTTLES DRAWN AEROBIC ONLY 10CC   Final    Culture  Setup Time 08/08/2012 19:01   Final    Culture NO GROWTH 5 DAYS   Final    Report Status 08/14/2012 FINAL   Final  CULTURE, BLOOD (ROUTINE X 2)     Status: Normal (Preliminary result)   Collection Time   08/14/12 10:05 AM      Component Value Range Status Comment   Specimen Description BLOOD RIGHT ANTECUBITAL   Final    Special Requests BOTTLES DRAWN AEROBIC ONLY 3CC   Final    Culture  Setup Time 08/14/2012 16:51   Final    Culture     Final    Value:        BLOOD CULTURE RECEIVED NO GROWTH TO DATE CULTURE WILL BE HELD FOR 5 DAYS BEFORE ISSUING A FINAL NEGATIVE REPORT   Report Status PENDING   Incomplete   CULTURE, BLOOD (ROUTINE X 2)     Status: Normal (Preliminary result)   Collection Time   08/14/12 10:15 AM      Component Value Range Status Comment   Specimen Description BLOOD RIGHT HAND   Final    Special Requests BOTTLES DRAWN AEROBIC ONLY 8CC   Final    Culture  Setup Time 08/14/2012 16:51   Final    Culture     Final    Value:        BLOOD CULTURE RECEIVED NO GROWTH TO DATE CULTURE WILL BE HELD FOR 5 DAYS BEFORE ISSUING A FINAL NEGATIVE REPORT   Report Status PENDING   Incomplete   URINE CULTURE     Status: Normal   Collection Time   08/14/12  9:29 PM      Component Value Range Status Comment   Specimen Description URINE, RANDOM   Final    Special Requests NONE   Final    Culture  Setup Time 08/14/2012 23:01   Final    Colony Count NO GROWTH   Final    Culture NO GROWTH   Final    Report Status 08/16/2012 FINAL   Final      Scheduled Meds:   . Chlorhexidine Gluconate Cloth  6 each Topical Q0600  . cloNIDine  0.2 mg Oral TID  . darbepoetin (ARANESP) injection - DIALYSIS  150 mcg Intravenous Q Mon-HD  . divalproex  250 mg Oral Q12H  .  famotidine  20 mg Oral Daily  . feeding supplement  1 Container Oral TID BM  . ferric gluconate (FERRLECIT/NULECIT) IV  125 mg Intravenous Q M,W,F-HD  . [COMPLETED] fluconazole  100 mg Oral q1800  . LORazepam  2 mg Oral BID  . methadone  2.5 mg Oral Q12H  . multivitamin  1 tablet Oral QHS  . saccharomyces boulardii  250 mg Oral BID  . silver sulfADIAZINE   Topical Daily  . thiamine  100 mg Oral Daily   Continuous Infusions:   . sodium chloride 20 mL/hr at 08/05/12 0645  . dextrose 5 % and 0.9% NaCl 10 mL/hr at 07/29/12 0914     Malcolm Quast, DO  [facility] Pager [phone]  If 7PM-7AM, please contact night-coverage [email or web address] Password [email or web address] 08/16/2012, 7:01 PM   LOS: 25 days

## 2012-08-16 NOTE — Progress Notes (Signed)
CSW spoke with patient's father at 9:30 am who understands the patient does not meet criteria for CIR and options are currently limited for placement.  Mr Mcglinn (father) will be at hospital later today and is willing to have discussion as he needs some professional input before making a decision. [address] CSW informed and Clinical research associate is willing to meet with pt's father.  Carney Bern, LCSWA Clinical Social Worker [phone]

## 2012-08-16 NOTE — Clinical Social Work Note (Signed)
CSW Santina Evans and this Clinical research associate met initally with patinet and his father in the room and then later met with father in [address] conference room and later with patient's father, mother, and sister. Chiropodist of [employer] joined conversation for a brief period of time to add clarity to medical questions the father had.  Please see CSW Ronda Fairly note for details of the meeting with patient and family.  Genelle Bal, MSW, LCSW [phone]

## 2012-08-17 LAB — RENAL FUNCTION PANEL
BUN: 40 mg/dL — ABNORMAL HIGH (ref 6–23)
Calcium: 9.3 mg/dL (ref 8.4–10.5)
Glucose, Bld: 103 mg/dL — ABNORMAL HIGH (ref 70–99)
Phosphorus: 5.2 mg/dL — ABNORMAL HIGH (ref 2.3–4.6)

## 2012-08-17 LAB — VANCOMYCIN, RANDOM: Vancomycin Rm: <5 ug/mL

## 2012-08-17 MED ORDER — VANCOMYCIN HCL IN DEXTROSE 1-5 GM/200ML-% IV SOLN
1000.0000 mg | Freq: Once | INTRAVENOUS | Status: DC
  Filled 2012-08-17: qty 200

## 2012-08-17 MED ORDER — VANCOMYCIN HCL 1000 MG IV SOLR: 750.0000 mg | INTRAVENOUS | Status: DC

## 2012-08-17 NOTE — Progress Notes (Signed)
Patient ID: Martin Chapman, male   DOB: August 05, 1991, 21 y.o.   MRN: [id number] S:Pt without complaints O:BP 105/66  Pulse 77  Temp 98.1 F (36.7 C) (Oral)  Resp 18  Ht 5\' 7"  (1.702 m)  Wt 62.7 kg (138 lb 3.7 oz)  BMI 21.65 kg/m2  SpO2 99%  Intake/Output Summary (Last 24 hours) at 08/17/12 1000 Last data filed at 08/17/12 2440  Gross per 24 hour  Intake    360 ml  Output   1275 ml  Net   -915 ml   Intake/Output: I/O last 3 completed shifts: In: 1200 [P.O.:1200] Out: 1750 [Urine:1750]  Intake/Output this shift:    Weight change: 0.6 kg (1 lb 5.2 oz) Gen:WD WN WM in NAD CVS:no rub Resp:CTA NUU:VOZDGU Ext:no edema, RUE with bandage   Lab 08/17/12 0620 08/16/12 0545 08/15/12 0550 08/14/12 0605 08/13/12 0650 08/12/12 0615 08/11/12 0645 08/10/12 1211  NA 134* 133* 136 134* 132* 135 134* --  K 4.3 4.2 4.1 4.0 3.9 4.0 4.3 --  CL 93* 91* 95* 92* 90* 93* 91* --  CO2 26 27 27 28 24 24 23  --  GLUCOSE 103* 96 92 101* 108* 122* 113* --  BUN 40* 38* 31* 23 36* 29* 17 --  CREATININE 3.32* 3.75* 3.94* 3.97* 5.99* 6.54* 5.65* --  ALBUMIN 3.4* 3.4* 3.4* 3.7 3.5 3.5 3.7 --  CALCIUM 9.3 9.3 9.2 9.7 9.4 9.5 9.7 --  PHOS 5.2* 5.3* 5.3* 4.5 5.2* 6.7* -- 7.1*  AST -- -- -- -- -- -- 41* --  ALT -- -- -- -- -- -- 18 --   Liver Function Tests:  Lab 08/17/12 0620 08/16/12 0545 08/15/12 0550 08/11/12 0645  AST -- -- -- 41*  ALT -- -- -- 18  ALKPHOS -- -- -- 99  BILITOT -- -- -- 0.4  PROT -- -- -- 7.7  ALBUMIN 3.4* 3.4* 3.4* --   No results found for this basename: LIPASE:3,AMYLASE:3 in the last 168 hours No results found for this basename: AMMONIA:3 in the last 168 hours CBC:  Lab 08/16/12 0545 08/15/12 0550 08/14/12 0605 08/13/12 0650 08/12/12 0615  WBC 10.6* 10.9* 12.1* -- --  NEUTROABS 6.7 -- -- -- --  HGB 8.6* 8.4* 8.5* -- --  HCT 25.9* 25.3* 24.8* -- --  MCV 86.6 85.2 87.0 84.4 85.7  PLT 515* 495* 531* -- --   Cardiac Enzymes: No results found for this basename:  CKTOTAL:5,CKMB:5,CKMBINDEX:5,TROPONINI:5 in the last 168 hours CBG: No results found for this basename: GLUCAP:5 in the last 168 hours  Iron Studies: No results found for this basename: IRON,TIBC,TRANSFERRIN,FERRITIN in the last 72 hours Studies/Results: No results found.    . cloNIDine  0.2 mg Oral TID  . darbepoetin (ARANESP) injection - DIALYSIS  150 mcg Intravenous Q Mon-HD  . divalproex  250 mg Oral Q12H  . famotidine  20 mg Oral Daily  . feeding supplement  1 Container Oral TID BM  . ferric gluconate (FERRLECIT/NULECIT) IV  125 mg Intravenous Q M,W,F-HD  . [COMPLETED] fluconazole  100 mg Oral q1800  . LORazepam  2 mg Oral BID  . methadone  2.5 mg Oral Q12H  . multivitamin  1 tablet Oral QHS  . saccharomyces boulardii  250 mg Oral BID  . silver sulfADIAZINE   Topical Daily  . thiamine  100 mg Oral Daily  . [DISCONTINUED] Chlorhexidine Gluconate Cloth  6 each Topical Q0600  . [DISCONTINUED] vancomycin  750 mg Intravenous Q24H  . [DISCONTINUED] vancomycin  1,000 mg Intravenous Once    BMET    Component Value Date/Time   NA 134* 08/17/2012 0620   K 4.3 08/17/2012 0620   CL 93* 08/17/2012 0620   CO2 26 08/17/2012 0620   GLUCOSE 103* 08/17/2012 0620   BUN 40* 08/17/2012 0620   CREATININE 3.32* 08/17/2012 0620   CALCIUM 9.3 08/17/2012 0620   GFRNONAA 25* 08/17/2012 0620   GFRAA 28* 08/17/2012 0620   CBC    Component Value Date/Time   WBC 10.6* 08/16/2012 0545   RBC 2.99* 08/16/2012 0545   HGB 8.6* 08/16/2012 0545   HCT 25.9* 08/16/2012 0545   PLT 515* 08/16/2012 0545   MCV 86.6 08/16/2012 0545   MCH 28.8 08/16/2012 0545   MCHC 33.2 08/16/2012 0545   RDW 13.8 08/16/2012 0545   LYMPHSABS 2.1 08/16/2012 0545   MONOABS 1.6* 08/16/2012 0545   EOSABS 0.2 08/16/2012 0545   BASOSABS 0.1 08/16/2012 0545     ASSESSMENT/PLAN:  1. AMS- ?anoxic brain injury/metabolic encephelopathy or psychiatric illness. Appreciate Psych eval for drug rehab  2. HD-dependent  ARF-improving creatinine. Last HD was on 08/13/12. Appears he is having recovery of renal function. Will plan to d/c permcath planned for today. 3. 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 Compartment syndrome- will need surgical follow up to evaluate wound as they have not left a note for some time. 5. CKD-MBD:resolved rhabdo 6. Nutrition:not eating 7. Hypertension:stable 8. Disposition- difficult situation if he has no recovery of renal function and is 20 days out from inititiating HD on 07/23/12. 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 Will need to discuss further with SW and Case management about options. I spoke with him candidly about his addiction and he is willing to speak with a Psychiatrist and go into a drug rehab program. Luckily it appears his renal function is returning and will not need outpt HD. Await disoposition. 9. Will sign off as his renal funtion is recovering and will not need outpt f/u unless his renal function does not return to normal once seen by his PCP.  Daryan Cagley A

## 2012-08-17 NOTE — Progress Notes (Addendum)
ANTIBIOTIC CONSULT NOTE - FOLLOW UP Pharmacy Consult for vancomycin Indication: pyrexia, wound on R arm- mildly purulent exudate on 08/14/12   No Known Allergies  Patient Measurements: Height: 5\' 7"  (170.2 cm) Weight: 138 lb 3.7 oz (62.7 kg) IBW/kg (Calculated) : 66.1    Vital Signs: Temp: 98.1 F (36.7 C) (11/15 0536) Temp src: Oral (11/15 0536) BP: 105/66 mmHg (11/15 0536) Pulse Rate: 77  (11/15 0536) Intake/Output from previous day: 11/14 0701 - 11/15 0700 In: 360 [P.O.:360] Out: 1550 [Urine:1550] Intake/Output from this shift:    Labs:  Basename 08/17/12 0620 08/16/12 0545 08/15/12 0550  WBC -- 10.6* 10.9*  HGB -- 8.6* 8.4*  PLT -- 515* 495*  LABCREA -- -- --  CREATININE 3.32* 3.75* 3.94*   Estimated Creatinine Clearance: 30.7 ml/min (by C-G formula based on Cr of 3.32).  Basename 08/17/12 0620  VANCOTROUGH --  Leodis Binet --  VANCORANDOM <5.0  GENTTROUGH --  GENTPEAK --  GENTRANDOM --  TOBRATROUGH --  Nolen Mu --  TOBRARND --  AMIKACINPEAK --  AMIKACINTROU --  AMIKACIN --     Microbiology: Recent Results (from the past 720 hour(s))  URINE CULTURE     Status: Normal   Collection Time   07/22/12  4:32 PM      Component Value Range Status Comment   Specimen Description URINE, CATHETERIZED   Final    Special Requests NONE   Final    Culture  Setup Time 07/23/2012 11:13   Final    Colony Count NO GROWTH   Final    Culture NO GROWTH   Final    Report Status 07/24/2012 FINAL   Final   CULTURE, BLOOD (ROUTINE X 2)     Status: Normal   Collection Time   07/22/12  6:42 PM      Component Value Range Status Comment   Specimen Description BLOOD LEFT ARM   Final    Special Requests BOTTLES DRAWN AEROBIC ONLY   Final    Culture  Setup Time 07/23/2012 10:16   Final    Culture NO GROWTH 5 DAYS   Final    Report Status 07/29/2012 FINAL   Final   CULTURE, BLOOD (ROUTINE X 2)     Status: Normal   Collection Time   07/22/12  6:51 PM      Component Value  Range Status Comment   Specimen Description BLOOD RIGHT ARM   Final    Special Requests BOTTLES DRAWN AEROBIC ONLY   Final    Culture  Setup Time 07/23/2012 10:16   Final    Culture NO GROWTH 5 DAYS   Final    Report Status 07/29/2012 FINAL   Final   MRSA PCR SCREENING     Status: Normal   Collection Time   07/23/12 12:30 AM      Component Value Range Status Comment   MRSA by PCR NEGATIVE  NEGATIVE Final   CULTURE, BLOOD (ROUTINE X 2)     Status: Normal   Collection Time   07/25/12  8:30 AM      Component Value Range Status Comment   Specimen Description BLOOD LEFT HAND   Final    Special Requests BOTTLES DRAWN AEROBIC AND ANAEROBIC 10CC   Final    Culture  Setup Time 07/25/2012 13:56   Final    Culture NO GROWTH 5 DAYS   Final    Report Status 07/31/2012 FINAL   Final   CULTURE, BLOOD (ROUTINE X 2)  Status: Normal   Collection Time   07/25/12  8:40 AM      Component Value Range Status Comment   Specimen Description BLOOD RIGHT HAND   Final    Special Requests BOTTLES DRAWN AEROBIC AND ANAEROBIC 10CC   Final    Culture  Setup Time 07/25/2012 13:56   Final    Culture NO GROWTH 5 DAYS   Final    Report Status 07/31/2012 FINAL   Final   URINE CULTURE     Status: Normal   Collection Time   07/25/12  8:49 AM      Component Value Range Status Comment   Specimen Description URINE, CATHETERIZED   Final    Special Requests NONE   Final    Culture  Setup Time 07/25/2012 15:30   Final    Colony Count NO GROWTH   Final    Culture NO GROWTH   Final    Report Status 07/26/2012 FINAL   Final   CLOSTRIDIUM DIFFICILE BY PCR     Status: Abnormal   Collection Time   07/28/12  6:15 AM      Component Value Range Status Comment   C difficile by pcr POSITIVE (*) NEGATIVE Final   CULTURE, BLOOD (ROUTINE X 2)     Status: Normal   Collection Time   08/03/12  2:37 PM      Component Value Range Status Comment   Specimen Description BLOOD RIGHT HAND   Final    Special Requests BOTTLES  DRAWN AEROBIC AND ANAEROBIC 10CC   Final    Culture  Setup Time 08/03/2012 21:42   Final    Culture NO GROWTH 5 DAYS   Final    Report Status 08/09/2012 FINAL   Final   CULTURE, BLOOD (ROUTINE X 2)     Status: Normal   Collection Time   08/03/12  3:51 PM      Component Value Range Status Comment   Specimen Description BLOOD ARM LEFT   Final    Special Requests BOTTLES DRAWN AEROBIC ONLY 10CC   Final    Culture  Setup Time 08/03/2012 21:42   Final    Culture NO GROWTH 5 DAYS   Final    Report Status 08/09/2012 FINAL   Final   CSF CULTURE     Status: Normal   Collection Time   08/05/12  5:50 PM      Component Value Range Status Comment   Specimen Description CSF   Final    Special Requests NONE   Final    Gram Stain     Final    Value: CYTOSPIN NO WBC SEEN     NO ORGANISMS SEEN   Culture NO GROWTH 3 DAYS   Final    Report Status 08/09/2012 FINAL   Final   AFB CULTURE WITH SMEAR     Status: Normal (Preliminary result)   Collection Time   08/05/12  5:50 PM      Component Value Range Status Comment   Specimen Description CSF   Final    Special Requests NONE   Final    ACID FAST SMEAR NOT DONE   Final    Culture     Final    Value: CULTURE WILL BE EXAMINED FOR 6 WEEKS BEFORE ISSUING A FINAL REPORT   Report Status PENDING   Incomplete   FUNGUS CULTURE W SMEAR     Status: Normal (Preliminary result)   Collection Time   08/05/12  5:50  PM      Component Value Range Status Comment   Specimen Description CSF   Final    Special Requests NONE   Final    Fungal Smear NO YEAST OR FUNGAL ELEMENTS SEEN   Final    Culture CULTURE IN PROGRESS FOR FOUR WEEKS   Final    Report Status PENDING   Incomplete   CULTURE, BLOOD (ROUTINE X 2)     Status: Normal   Collection Time   08/08/12  1:40 PM      Component Value Range Status Comment   Specimen Description BLOOD LEFT HAND   Final    Special Requests BOTTLES DRAWN AEROBIC ONLY 10CC   Final    Culture  Setup Time 08/08/2012 19:01   Final     Culture NO GROWTH 5 DAYS   Final    Report Status 08/14/2012 FINAL   Final   CULTURE, BLOOD (ROUTINE X 2)     Status: Normal   Collection Time   08/08/12  1:50 PM      Component Value Range Status Comment   Specimen Description BLOOD RIGHT HAND   Final    Special Requests BOTTLES DRAWN AEROBIC ONLY 10CC   Final    Culture  Setup Time 08/08/2012 19:01   Final    Culture NO GROWTH 5 DAYS   Final    Report Status 08/14/2012 FINAL   Final   CULTURE, BLOOD (ROUTINE X 2)     Status: Normal (Preliminary result)   Collection Time   08/14/12 10:05 AM      Component Value Range Status Comment   Specimen Description BLOOD RIGHT ANTECUBITAL   Final    Special Requests BOTTLES DRAWN AEROBIC ONLY 3CC   Final    Culture  Setup Time 08/14/2012 16:51   Final    Culture     Final    Value:        BLOOD CULTURE RECEIVED NO GROWTH TO DATE CULTURE WILL BE HELD FOR 5 DAYS BEFORE ISSUING A FINAL NEGATIVE REPORT   Report Status PENDING   Incomplete   CULTURE, BLOOD (ROUTINE X 2)     Status: Normal (Preliminary result)   Collection Time   08/14/12 10:15 AM      Component Value Range Status Comment   Specimen Description BLOOD RIGHT HAND   Final    Special Requests BOTTLES DRAWN AEROBIC ONLY 8CC   Final    Culture  Setup Time 08/14/2012 16:51   Final    Culture     Final    Value:        BLOOD CULTURE RECEIVED NO GROWTH TO DATE CULTURE WILL BE HELD FOR 5 DAYS BEFORE ISSUING A FINAL NEGATIVE REPORT   Report Status PENDING   Incomplete   URINE CULTURE     Status: Normal   Collection Time   08/14/12  9:29 PM      Component Value Range Status Comment   Specimen Description URINE, RANDOM   Final    Special Requests NONE   Final    Culture  Setup Time 08/14/2012 23:01   Final    Colony Count NO GROWTH   Final    Culture NO GROWTH   Final    Report Status 08/16/2012 FINAL   Final     Assessment:Assessment:  Pt is a 21 yo man admitted 10/19 for heroin OD complicated by ARF and rhabdomyolysis and RUE injury  with compartment syndrome requiring fasciotomy of R arm  10/20 and wound vac on 10/22. He started IV vancomycin 11/12 for temp of 101.2 overnight with R arm wound w/ mildly purulent exudate.  Today is vanc # 4. All cultures are negative. His WBC was 10.6 yesterday. He has been AF since his last fever on 11/12 am of 100.5. His creatinine is 3.32 today with a creat clearance ~ 28 ml/min.  His random vancomycin level this morning was undetectable at < 5 mcg/ml.  This is less than the his goal vanc trough of 10-15 mcg/ml for r/o cellulitis.   He was previously getting HD - but now his kidneys are recovering. His last HD 08/13/12.  Previously he completed a course of oral vancomycin for C diff and he completed a 7 day course of diflucan for oral thrush 11/14.    Goal of Therapy:  Vancomycin trough level 10-15 mcg/ml  Plan:  1. Vancomycin 1000 mg IV x1 dose today 2. Vancomycin 750 mg IV q24 to start 11/16 am 3. F/u renal function closely 4. ?duration of abx? Considering stopping abx  Herby Abraham, Pharm.D. [phone] 08/17/2012 8:31 AM Len Childs T 08/17/2012,8:22 AM Addendum: d/w Dr. Arbutus Leas on rounds:  DC vancomycin Herby Abraham, Pharm.D. [phone] 08/17/2012 9:32 AM

## 2012-08-17 NOTE — Progress Notes (Signed)
[facility] PROGRESS NOTE  Martin Chapman:[id number] DOB: 06-Mar-1991 DOA: 07/22/2012 PCP: No primary provider on file.  Assessment/Plan: 1. Acute hypoxic respiratory failure:  -Secondary to heroin overdose and alcohol ingestion  -Likely a component of aspiration pneumonitis  -Clinically stable on room air at this time  2. Hypovolemic/septic shock: -lactic acidosis, hypotension, and rhabdomyolysis have improved  -Remains hemodynamically stable  3. Rhabdomyolysis/Acute renal failure:  -Rhabdomyolysis improving with intravenous fluids  -Renal function continues to improve off of dialysis  -Last dialysis 08/13/2012  4. Elevated LFT's and INR: This was likely due to shock liver. Resolved as of 08/11/12.  5. C. Difficile infection:  -Treated with 14 days of oral vancomycin  -Resolved  6. Hyperglycemia:  -Resolved  7. Acute encephalopathy: This was toxic/metabolic. There may also be an element of anoxic brain damage. EEG revealed findings consistent with mild encephalopathy, and brain MRI brain was negative for acute pathology. B12, Thiamine, ammonia, TSH, all within normal limits.HIV test and RPR were negative. Mental status is close to new baseline at this time, although patient occasionally appears disoriented and forgetful. He has had some periods of severe agitation on 08/13/12, requiring 1:1 Sitter and restraints.  -Lumbar puncture was negative for infectious etiology  -Original Psychiatrist consultation seen on 08/14/2012 by Dr.Jonnalagadda  --> Recommended Haldol and Ativan when necessary agitation  -Will need referrals to outpatient alcoholic and Narcotics Anonymous  -reconsult psychiatry again today for capacity evaluation and referral to drug rehabilitation  8. Right upper extremity injury with compartment syndrome and possible vascular injury: Dr Aldean Baker was consulted for management, and performed surgery with shoulder release and fasciotomy of right arm on  07/22/12, followed by excision of biceps muscle right arm, neurolysis median nerve, secondary closure of triceps and biceps incision, with application of wound vac on 07/24/12. Arch aortogram in OR normal. No vascular deficit in RUE/shoulder. Managing per Dr. Lajoyce Corners and wound care. Wound vac has since been discontinued -Right upper extremity wound with great granulation tissue--no lymphangitis, necrosis or other signs of infection 9. Oral thrush:  -Improved  10. HTN: Patient's BP has been persistently elevated, since resolution of shock, in spite of hemodialysis. Adjusting Clonidine as indicated, with improvement.  11. Pyrexia:  -No fevers in the last 72 hours  -Discontinue all antibiotics and observe 12. Metabolic encephalopathy  -Speech therapy evaluation for cognitive linguistic assessment revealedHe demonstrated adequate focused and sustained attention in a quiet, non-distracting environment; verbal recall of sequential words and problem-solving situations was normal after 2", 5", then 10" intervals. Problem-solving for hypothetical situations was functional - responses were vague but appropriate. Mental flexibility for complex reasoning tasks was more difficult, with pt showing poor persistance. Verbal impulsivity noted. Uses/understands basic humor. Pt's symptoms appear less attributable to hypoxia than to behavioral/polysubstance hx and residual encephalopathic component, but will defer to psychiatry for diagnosis. No formal SLP intervention warranted on acute level  -MRI brain negative for signs of hypoxemic encephalopathy--> shows premature cerebellar and cerebral atrophy       Disposition Plan:   Inpatient drug rehabilitation versus home with father      Procedures/Studies: [name] Contrast  08/04/2012  *RADIOLOGY REPORT*  Clinical Data: Polysubstance abuse.  Altered mental status.  Renal failure.  MRI HEAD WITHOUT CONTRAST  Technique:  Multiplanar, multiecho pulse sequences of  the brain and surrounding structures were obtained according to standard protocol without intravenous contrast.  Comparison: CT head 04/08/2012 (normal)  Findings: The patient had difficulty remaining motionless for the study.  Images are suboptimal.  Small or subtle lesions could be overlooked.  There is no evidence for acute infarction, intracranial hemorrhage, mass lesion, hydrocephalus, or extra-axial fluid.  There may be mild premature cerebral and cerebellar atrophy for age.  There is no visible white matter disease.  No evidence for hypoxic ischemic encephalopathy.  No basal ganglia T1 shortening to suggest early hepatic encephalopathy.  Normal pituitary and cerebellar tonsils. Major intracranial flow voids are preserved.  Chronic sinus disease right division sphenoid sinus.  Slight dependent mastoid fluid bilaterally.  Because of renal failure, Gadolinium contrast could not be administered.  Meningeal enhancement or early changes of meningoencephalitis could be inapparent on this noncontrast examination.  IMPRESSION: Except for mild premature cerebral and cerebellar atrophy, there is no visible acute intracranial abnormality.  No signs of hypoxic ischemic encephalopathy or acute/subacute infarction.  No foci of chronic hemorrhage.  No evidence for hepatic encephalopathy metabolic changes.   Original Report Authenticated By: Davonna Belling, M.D.    US Renal  07/23/2012  *RADIOLOGY REPORT*  Clinical Data: Acute renal failure  RENAL/URINARY TRACT ULTRASOUND COMPLETE  Comparison:  CT abdomen pelvis - 04/08/2012; chest radiograph - 07/23/2012  Findings:  Right Kidney:  Normal cortical thickness, echogenicity and size, measuring 13.2 cm in length.  There is a minimal amount of perinephric fluid.  No focal renal lesions.  No echogenic renal stones.  No urinary obstruction.  Left Kidney:  Normal cortical thickness, echogenicity and size, measuring 13.8 cm in length. There is a small amount of perirenal fluid.  No  focal renal lesions.  No echogenic renal stones.  No urinary obstruction.  Bladder:  Decompressed with a Foley catheter  Small amount of ascites is noted within the pelvis.  There is a minimal amount of perisplenic fluid.  Small left-sided pleural effusion is incidentally noted  IMPRESSION: 1.  No sonographic explanation for patient's acute renal failure. Specifically, no evidence of urinary obstruction. 2.  Findings suggestive of volume overload/third spacing including a minimal amount of ascites, small left-sided pleural effusion and small amount of perirenal fluid, nonspecific but possibly attributable to reported history of acute renal failure.   Original Report Authenticated By: Waynard Reeds, M.D.    Dg Chest Port 1 View  08/14/2012  *RADIOLOGY REPORT*  Clinical Data: Fever  PORTABLE CHEST - 1 VIEW  Comparison: August 08, 2012  Findings:  Central catheter tip is at the junction of the superior vena cava and right atrium.  No pneumothorax.  The lungs are clear. The heart size and pulmonary vascularity are normal.  No adenopathy.  There is an old healed fracture of the left clavicle.  IMPRESSION: Lungs clear. Central catheter position unchanged.  No pneumothorax.   Original Report Authenticated By: Bretta Bang, M.D.    Dg Chest Port 1 View  08/08/2012  *RADIOLOGY REPORT*  Clinical Data: Fever  PORTABLE CHEST - 1 VIEW  Comparison: 07/31/2012  Findings: Left large bore catheter tips project over the mid/distal SVC.  Heart size upper normal to mildly enlarged.  Mild central vascular congestion. Mild peribronchial thickening. The above findings may be accentuated by hypoaeration. No confluent airspace opacity.  No pleural effusion or pneumothorax.  No acute osseous finding.  IMPRESSION: Mild peribronchial thickening is nonspecific, can be seen with bronchitis or mild edema.  No confluent airspace opacity.   Original Report Authenticated By: Jearld Lesch, M.D.    Dg Chest Port 1  View  07/31/2012  *RADIOLOGY REPORT*  Clinical Data: Evaluate for pneumothorax.  Insertion of dialysis catheter.  PORTABLE CHEST - 1 VIEW  Comparison: Chest x-ray 07/29/2012.  Findings: There is a new left internal jugular PermCath and position with the tips terminating in the right atrium and at the superior cavoatrial junction. There is a left-sided internal jugular central venous catheter with tip terminating in the distal left innominate vein.  Nasogastric tube has been withdrawn, now with tip near the gastroesophageal junction.  Lung volumes are low. No pneumothorax.  No acute consolidative airspace disease. Crowding of the pulmonary vasculature, accentuated by low lung volumes, without frank pulmonary edema.  No pleural effusions. Heart size is normal.  Mediastinal contours are unremarkable.  IMPRESSION: 1.  New left IJ PermCath appears properly located. 2.  No definite pneumothorax following dialysis catheter placement. 3.  Tip of nasogastric tube is now at the gastroesophageal junction and could be advanced at least 8 cm for more optimal placement. Additional support apparatus, as above.   Original Report Authenticated By: Florencia Reasons, M.D.    Dg Chest Port 1 View  07/29/2012  *RADIOLOGY REPORT*  Clinical Data: Atelectasis.  PORTABLE CHEST - 1 VIEW  Comparison: 07/28/2012.  Findings: Endotracheal tube tip 1.9 cm above the carina.  Left central line tip mid superior vena cava level.  Nasogastric tube side hole just beyond the gastroesophageal junction.  No gross pneumothorax.  Mild central pulmonary vascular prominence.  No segmental consolidation.  Oblique subsegmental atelectasis right midlung.  IMPRESSION: No significant change in the subsegmental atelectasis right midlung.   Original Report Authenticated By: Fuller Canada, M.D.    Dg Chest Port 1 View  07/28/2012  *RADIOLOGY REPORT*  Clinical Data: Drug overdose, intubated  PORTABLE CHEST - 1 VIEW  Comparison:   the previous day's study   Findings: Endotracheal tube, nasogastric tube, and left IJ central line are stable in position.  Some improvement in the perihilar edema.  Linear atelectasis or early infiltrate in the right lower lung has not significantly changed.  No effusion.  Heart size normal.  IMPRESSION:  1.  Improved   perihilar opacities. 2. Support hardware stable in position.   Original Report Authenticated By: Osa Craver, M.D.    Dg Chest Port 1 View  07/27/2012  *RADIOLOGY REPORT*  Clinical Data: Evaluate endotracheal tube.  PORTABLE CHEST - 1 VIEW  Comparison: 07/26/2012  Findings: Endotracheal tube is 4.2 cm above the carina.  Central line tip in the SVC region.  Improved aeration at the left lung base.  No focal airspace disease.  Heart size is normal. Nasogastric tube in the stomach body region.  IMPRESSION: Improved aeration at the left lung base.  Support apparatuses as described.   Original Report Authenticated By: Richarda Overlie, M.D.    Dg Chest Port 1 View  07/26/2012  *RADIOLOGY REPORT*  Clinical Data: Atelectasis.  Shortness of breath.  PORTABLE CHEST - 1 VIEW  Comparison: Chest x-ray 07/25/2012.  Findings: An endotracheal tube is in place with tip 3.2 cm above the carina. There is a left-sided internal jugular central venous catheter with tip terminating in the superior cavoatrial junction. Lung volumes are low.  Bibasilar opacities (left greater than right), favored to represent areas of subsegmental atelectasis. Blunting of the left costophrenic sulcus favored to reflect a small left pleural effusion.  Crowding of the pulmonary vasculature, accentuated by low lung volumes.  Heart size is within normal limits. The patient is rotated to the right on today's exam, resulting in distortion of the mediastinal contours and reduced  diagnostic sensitivity and specificity for mediastinal pathology.  IMPRESSION: 1.  Support apparatus, as above. 2.  Low lung volumes with worsening bibasilar atelectasis and new small  left pleural effusion.   Original Report Authenticated By: Florencia Reasons, M.D.    Dg Chest Port 1 View  07/25/2012  *RADIOLOGY REPORT*  Clinical Data: Intubated patient  PORTABLE CHEST - 1 VIEW  Comparison: Chest radiograph 07/24/2012  Findings: Endotracheal tube is 2.2 cm from carina.  Left-sided central venous line is unchanged in position.  Stable cardiac silhouette.  Lungs are clear.  No pneumothorax.  IMPRESSION: Endotracheal tube 2.2 cm from carina.   Original Report Authenticated By: Genevive Bi, M.D.    Dg Chest Port 1 View  07/24/2012  *RADIOLOGY REPORT*  Clinical Data: Evaluate endotracheal tube position  PORTABLE CHEST - 1 VIEW  Comparison: Chest radiograph 07/23/2012 at 1:08 hours  Findings: Current radiograph is at 5:47 hours.  The patient is markedly rotated to the right on the first image.  A second image was performed, without significant rotation. The endotracheal tube is approximately 4 cm above the carina, and at the level of the clavicular heads.  Left IJ central venous catheter terminates in the distal superior vena cava.  Nasogastric tube courses into the stomach and continues below the edge of the image.  Lung volumes are low.  Heart size is normal. There are bilateral hazy opacities at the lung bases and costophrenic angles, consistent with small bilateral pleural effusions. Aeration of the right upper lung field is improved compared to the chest radiograph performed 07/23/2012.  IMPRESSION: Satisfactory position of endotracheal tube.  Small bilateral pleural effusions and low lung volumes. Improved aeration of the right upper lung field compared to the 07/23/2012 chest radiograph.   Original Report Authenticated By: Britta Mccreedy, M.D.    Dg Chest Port 1 View  07/23/2012  *RADIOLOGY REPORT*  Clinical Data: Respiratory failure and overdose.  PORTABLE CHEST - 1 VIEW  Comparison: 07/22/2012  Findings: Since the prior study, the patient has been intubated with endotracheal  tube tip approximately 2 cm above the carina. Left jugular central line tip lies in the lower SVC.  A nasogastric tube extends into the proximal stomach.  Lung volumes are low with interval development of mild pulmonary edema and potentially additional infiltrate in the right lung.  No significant pleural effusions are identified.  Heart size is within normal limits.  IMPRESSION: Development of mild pulmonary edema and potentially also infiltrate of the right lung.   Original Report Authenticated By: Reola Calkins, M.D.    Dg Chest Portable 1 View  07/22/2012  *RADIOLOGY REPORT*  Clinical Data: Drug overdose.  PORTABLE CHEST - 1 VIEW  Comparison: 04/08/2012  Findings: Heart size and pulmonary vascularity are normal and the lungs are clear.  No osseous abnormality.  IMPRESSION: Normal chest.   Original Report Authenticated By: Gwynn Burly, M.D.    Dg Shoulder Right Port  07/22/2012  *RADIOLOGY REPORT*  Clinical Data: Right shoulder pain.  PORTABLE RIGHT SHOULDER - 2+ VIEW  Comparison: None.  Findings: No fracture, dislocation, or other abnormality.  IMPRESSION: Normal exam.   Original Report Authenticated By: Gwynn Burly, M.D.          Subjective: Patient continues to be belligerent to ancillary staff. Patient refuses physical therapy as well as other care. Currently denies chest pain, shortness breath, abdominal pain, diarrhea.  Objective: Filed Vitals:   08/16/12 1700 08/16/12 2021 08/17/12 0536 08/17/12 1019  BP: 116/76  118/65 105/66 119/89  Pulse: 74 70 77 82  Temp: 97.4 F (36.3 C) 98.4 F (36.9 C) 98.1 F (36.7 C)   TempSrc: Oral Oral Oral   Resp:  18 18 14   Height:      Weight:  62.7 kg (138 lb 3.7 oz)    SpO2:  100% 99% 97%    Intake/Output Summary (Last 24 hours) at 08/17/12 1555 Last data filed at 08/17/12 1300  Gross per 24 hour  Intake    480 ml  Output    975 ml  Net   -495 ml   Weight change: 0.6 kg (1 lb 5.2 oz) Exam:   General:  Pt is alert,  follows commands appropriately, not in acute distress  HEENT: No icterus, No thrush,[place]/AT  Cardiovascular: RRR, S1/S2, no rubs, no gallops  Respiratory: CTA bilaterally, no wheezing, no crackles, no rhonchi  Abdomen: Soft/+BS, non tender, non distended, no guarding  Extremities: Right upper extremity with good granulation; no lymphangitis, no necrosis, no crepitance  Data Reviewed: Basic Metabolic Panel:  Lab 08/17/12 4098 08/16/12 0545 08/15/12 0550 08/14/12 0605 08/13/12 0650  NA 134* 133* 136 134* 132*  K 4.3 4.2 4.1 4.0 3.9  CL 93* 91* 95* 92* 90*  CO2 26 27 27 28 24   GLUCOSE 103* 96 92 101* 108*  BUN 40* 38* 31* 23 36*  CREATININE 3.32* 3.75* 3.94* 3.97* 5.99*  CALCIUM 9.3 9.3 9.2 9.7 9.4  MG -- -- -- -- --  PHOS 5.2* 5.3* 5.3* 4.5 5.2*   Liver Function Tests:  Lab 08/17/12 0620 08/16/12 0545 08/15/12 0550 08/14/12 0605 08/13/12 0650 08/11/12 0645  AST -- -- -- -- -- 41*  ALT -- -- -- -- -- 18  ALKPHOS -- -- -- -- -- 99  BILITOT -- -- -- -- -- 0.4  PROT -- -- -- -- -- 7.7  ALBUMIN 3.4* 3.4* 3.4* 3.7 3.5 --   No results found for this basename: LIPASE:5,AMYLASE:5 in the last 168 hours No results found for this basename: AMMONIA:5 in the last 168 hours CBC:  Lab 08/16/12 0545 08/15/12 0550 08/14/12 0605 08/13/12 0650 08/12/12 0615  WBC 10.6* 10.9* 12.1* 15.4* 13.3*  NEUTROABS 6.7 -- -- -- --  HGB 8.6* 8.4* 8.5* 8.1* 8.5*  HCT 25.9* 25.3* 24.8* 24.4* 25.7*  MCV 86.6 85.2 87.0 84.4 85.7  PLT 515* 495* 531* 647* 603*   Cardiac Enzymes: No results found for this basename: CKTOTAL:5,CKMB:5,CKMBINDEX:5,TROPONINI:5 in the last 168 hours BNP: No components found with this basename: POCBNP:5 CBG: No results found for this basename: GLUCAP:5 in the last 168 hours  Recent Results (from the past 240 hour(s))  CULTURE, BLOOD (ROUTINE X 2)     Status: Normal   Collection Time   08/08/12  1:40 PM      Component Value Range Status Comment   Specimen Description BLOOD  LEFT HAND   Final    Special Requests BOTTLES DRAWN AEROBIC ONLY 10CC   Final    Culture  Setup Time 08/08/2012 19:01   Final    Culture NO GROWTH 5 DAYS   Final    Report Status 08/14/2012 FINAL   Final   CULTURE, BLOOD (ROUTINE X 2)     Status: Normal   Collection Time   08/08/12  1:50 PM      Component Value Range Status Comment   Specimen Description BLOOD RIGHT HAND   Final    Special Requests BOTTLES DRAWN AEROBIC ONLY 10CC   Final  Culture  Setup Time 08/08/2012 19:01   Final    Culture NO GROWTH 5 DAYS   Final    Report Status 08/14/2012 FINAL   Final   CULTURE, BLOOD (ROUTINE X 2)     Status: Normal (Preliminary result)   Collection Time   08/14/12 10:05 AM      Component Value Range Status Comment   Specimen Description BLOOD RIGHT ANTECUBITAL   Final    Special Requests BOTTLES DRAWN AEROBIC ONLY 3CC   Final    Culture  Setup Time 08/14/2012 16:51   Final    Culture     Final    Value:        BLOOD CULTURE RECEIVED NO GROWTH TO DATE CULTURE WILL BE HELD FOR 5 DAYS BEFORE ISSUING A FINAL NEGATIVE REPORT   Report Status PENDING   Incomplete   CULTURE, BLOOD (ROUTINE X 2)     Status: Normal (Preliminary result)   Collection Time   08/14/12 10:15 AM      Component Value Range Status Comment   Specimen Description BLOOD RIGHT HAND   Final    Special Requests BOTTLES DRAWN AEROBIC ONLY 8CC   Final    Culture  Setup Time 08/14/2012 16:51   Final    Culture     Final    Value:        BLOOD CULTURE RECEIVED NO GROWTH TO DATE CULTURE WILL BE HELD FOR 5 DAYS BEFORE ISSUING A FINAL NEGATIVE REPORT   Report Status PENDING   Incomplete   URINE CULTURE     Status: Normal   Collection Time   08/14/12  9:29 PM      Component Value Range Status Comment   Specimen Description URINE, RANDOM   Final    Special Requests NONE   Final    Culture  Setup Time 08/14/2012 23:01   Final    Colony Count NO GROWTH   Final    Culture NO GROWTH   Final    Report Status 08/16/2012 FINAL   Final       Scheduled Meds:   . cloNIDine  0.2 mg Oral TID  . darbepoetin (ARANESP) injection - DIALYSIS  150 mcg Intravenous Q Mon-HD  . divalproex  250 mg Oral Q12H  . famotidine  20 mg Oral Daily  . [COMPLETED] fluconazole  100 mg Oral q1800  . LORazepam  2 mg Oral BID  . methadone  2.5 mg Oral Q12H  . multivitamin  1 tablet Oral QHS  . saccharomyces boulardii  250 mg Oral BID  . silver sulfADIAZINE   Topical Daily  . thiamine  100 mg Oral Daily  . [DISCONTINUED] Chlorhexidine Gluconate Cloth  6 each Topical Q0600  . [DISCONTINUED] feeding supplement  1 Container Oral TID BM  . [DISCONTINUED] ferric gluconate (FERRLECIT/NULECIT) IV  125 mg Intravenous Q M,W,F-HD  . [DISCONTINUED] vancomycin  750 mg Intravenous Q24H  . [DISCONTINUED] vancomycin  1,000 mg Intravenous Once   Continuous Infusions:   . [DISCONTINUED] sodium chloride Stopped (08/16/12 1900)  . [DISCONTINUED] dextrose 5 % and 0.9% NaCl Stopped (08/16/12 1900)     Tanika Bracco, DO  [facility] Pager [phone]  If 7PM-7AM, please contact night-coverage [email or web address] Password [email or web address] 08/17/2012, 3:55 PM   LOS: 26 days

## 2012-08-17 NOTE — Progress Notes (Signed)
Utilization review completed.  

## 2012-08-17 NOTE — Progress Notes (Signed)
CSW spoke with both patient's mother and father this morning in reference to involuntary referral to [facility].  Both family members agreeable and necessary paperwork and referral was faxed to [facility], received and will be reviewed. [facility] has contact information for both Clinical research associate and weekend SW staff.  Carney Bern, LCSWA Clinical Social Worker [phone]

## 2012-08-17 NOTE — Progress Notes (Signed)
Pt says dressing on right arm was changed by Doctor  this am. Education about dressing change explained but Pt still refuses dressing at this time. Said he want to wait and change it tomorrow, because he does it every other day. Adele Barthel

## 2012-08-17 NOTE — Consult Note (Signed)
Reason for Consult:Capacity evaluation Referring Physician: Dr. Patrecia Pace is an 21 y.o. male.  HPI: Patient was seen and chart reviewed. Patient has a long history of polysubstance dependence including opiate dependence, depression, anxiety, and panic attacks. Patient was admitted to [facility] medical floor for opiate overdose and alcohol intoxication and found unresponsive. Before came to the [facility] emergency department. Reportedly, he was brought in to the [facility] emergency department by his friends.  Reportedly he was responded positively to Narcan on arrival. Psychiatric consultation was requested for capacity evaluation to make medical decisions and the placement needs. Patient was calm, and cooperative. Patient endorses his the problem with the substance abuse and dependence and also agree with the substance abuse rehabilitation treatment. Patient was able to walk into the restroom by himself without any help. Patient denied current symptoms of depression, anxiety, suicidal, homicidal ideation, and evidence of psychotic symptoms. Patient has no previous acute psychiatric hospitalization at [facility].   MSE: patient was found on his served phase been walked into the room. Patient was responded to the verbal command, and he was able to sit on his bed and able to converse freely. Patient stated he is feeling better. He has no current symptoms of withdrawal or craving. He denies current symptoms of depression, anxiety. Patient denied suicidal, homicidal ideation, intent and plans. Patient has no evidence of psychotic symptoms.  Past Medical History  Diagnosis Date  . Anxiety   . Panic attacks   . Depression     Past Surgical History  Procedure Date  . Extraction of wisdom teeth   . Intraoperative arteriogram 07/22/2012    Procedure: INTRA OPERATIVE ARTERIOGRAM;  Surgeon: Sherren Kerns, MD;  Location: [facility] OR;  Service: Vascular;  Laterality: Left;   arch aortagram, second order catheterization right subclavian artery, ultrasound left groin  . Insertion of dialysis catheter 07/22/2012    Procedure: INSERTION OF DIALYSIS CATHETER;  Surgeon: Sherren Kerns, MD;  Location: [facility] OR;  Service: Vascular;  Laterality: Right;  . Fasciotomy 07/22/2012    Procedure: FASCIOTOMY;  Surgeon: Nadara Mustard, MD;  Location: [facility] OR;  Service: Orthopedics;  Laterality: Right;  . I&d extremity 07/24/2012    Procedure: IRRIGATION AND DEBRIDEMENT EXTREMITY;  Surgeon: Nadara Mustard, MD;  Location: [facility] OR;  Service: Orthopedics;  Laterality: Right;  Right Arm Irrigation and Debridement, VAC Application  . Application of wound vac 07/24/2012    Procedure: APPLICATION OF WOUND VAC;  Surgeon: Nadara Mustard, MD;  Location: [facility] OR;  Service: Orthopedics;  Laterality: Right;  . Insertion of dialysis catheter 07/31/2012    Procedure: INSERTION OF DIALYSIS CATHETER;  Surgeon: Nada Libman, MD;  Location: [facility] OR;  Service: Vascular;  Laterality: Left;  internal jugular    Family History  Problem Relation Age of Onset  . Hypertension Father     Social History:  reports that he has been smoking Cigarettes.  He has been smoking about 1 pack per day. He does not have any smokeless tobacco history on file. He reports that he drinks alcohol. He reports that he uses illicit drugs (Cocaine).  Allergies: No Known Allergies  Medications:I have reviewed the patient's current medications.}  Results for orders placed during the hospital encounter of 07/22/12 (from the past 48 hour(s))  RENAL FUNCTION PANEL     Status: Abnormal   Collection Time   08/16/12  5:45 AM      Component Value Range Comment  Sodium 133 (*) 135 - 145 mEq/L    Potassium 4.2  3.5 - 5.1 mEq/L    Chloride 91 (*) 96 - 112 mEq/L    CO2 27  19 - 32 mEq/L    Glucose, Bld 96  70 - 99 mg/dL    BUN 38 (*) 6 - 23 mg/dL    Creatinine, Ser 1.61 (*) 0.50 - 1.35 mg/dL    Calcium 9.3  8.4 - 09.6 mg/dL     Phosphorus 5.3 (*) 2.3 - 4.6 mg/dL    Albumin 3.4 (*) 3.5 - 5.2 g/dL    GFR calc non Af Amer 21 (*) >90 mL/min    GFR calc Af Amer 25 (*) >90 mL/min   CBC WITH DIFFERENTIAL     Status: Abnormal   Collection Time   08/16/12  5:45 AM      Component Value Range Comment   WBC 10.6 (*) 4.0 - 10.5 K/uL    RBC 2.99 (*) 4.22 - 5.81 MIL/uL    Hemoglobin 8.6 (*) 13.0 - 17.0 g/dL    HCT 04.5 (*) 40.9 - 52.0 %    MCV 86.6  78.0 - 100.0 fL    MCH 28.8  26.0 - 34.0 pg    MCHC 33.2  30.0 - 36.0 g/dL    RDW 81.1  91.4 - 78.2 %    Platelets 515 (*) 150 - 400 K/uL    Neutrophils Relative 63  43 - 77 %    Neutro Abs 6.7  1.7 - 7.7 K/uL    Lymphocytes Relative 20  12 - 46 %    Lymphs Abs 2.1  0.7 - 4.0 K/uL    Monocytes Relative 15 (*) 3 - 12 %    Monocytes Absolute 1.6 (*) 0.1 - 1.0 K/uL    Eosinophils Relative 2  0 - 5 %    Eosinophils Absolute 0.2  0.0 - 0.7 K/uL    Basophils Relative 1  0 - 1 %    Basophils Absolute 0.1  0.0 - 0.1 K/uL   RENAL FUNCTION PANEL     Status: Abnormal   Collection Time   08/17/12  6:20 AM      Component Value Range Comment   Sodium 134 (*) 135 - 145 mEq/L    Potassium 4.3  3.5 - 5.1 mEq/L    Chloride 93 (*) 96 - 112 mEq/L    CO2 26  19 - 32 mEq/L    Glucose, Bld 103 (*) 70 - 99 mg/dL    BUN 40 (*) 6 - 23 mg/dL    Creatinine, Ser 9.56 (*) 0.50 - 1.35 mg/dL    Calcium 9.3  8.4 - 21.3 mg/dL    Phosphorus 5.2 (*) 2.3 - 4.6 mg/dL    Albumin 3.4 (*) 3.5 - 5.2 g/dL    GFR calc non Af Amer 25 (*) >90 mL/min    GFR calc Af Amer 28 (*) >90 mL/min   VANCOMYCIN, RANDOM     Status: Normal   Collection Time   08/17/12  6:20 AM      Component Value Range Comment   Vancomycin Rm <5.0       No results found.  Positive for excessive alcohol consumption and illegal drug usage Blood pressure 115/72, pulse 74, temperature 98.5 F (36.9 C), temperature source Oral, resp. rate 10, height 5\' 7"  (1.702 m), weight 138 lb 3.7 oz (62.7 kg), SpO2  98.00%.   Assessment/Plan: Polysubstance dependence and intoxication  Patient has  full capacity  to make his own medical decisions and placement needs.  Appreciate psychiatric consultation. Please contact psychiatric consultation as needed. Patient does not meet criteria for substance detox treatment, but he will required long-term rehabilitation services. Patient has willingness to go to [facility].  Saje Gallop,JANARDHAHA R. 08/17/2012, 6:25 PM

## 2012-08-18 DIAGNOSIS — N186 End stage renal disease: Secondary | ICD-10-CM

## 2012-08-18 LAB — CBC
Hemoglobin: 9.2 g/dL — ABNORMAL LOW (ref 13.0–17.0)
MCH: 30 pg (ref 26.0–34.0)
RBC: 3.07 MIL/uL — ABNORMAL LOW (ref 4.22–5.81)
WBC: 9.6 10*3/uL (ref 4.0–10.5)

## 2012-08-18 LAB — RENAL FUNCTION PANEL
BUN: 39 mg/dL — ABNORMAL HIGH (ref 6–23)
CO2: 27 mEq/L (ref 19–32)
Chloride: 95 mEq/L — ABNORMAL LOW (ref 96–112)
Creatinine, Ser: 3.26 mg/dL — ABNORMAL HIGH (ref 0.50–1.35)
GFR calc non Af Amer: 25 mL/min — ABNORMAL LOW (ref >90)

## 2012-08-18 NOTE — Progress Notes (Signed)
[facility] SHORT STAY H&P  CC:  Catheter removal   HPI:  a 21 y.o. male with a past history of heroin addiction, depression, anxiety and panic attacks. Brought to ER yesterday by his friends (around [facility]) after found unresponsive overdosed on heroin and alcohol the night before. On 1020 had fasciotomy and angiography (75mL contrast volume) by Dr Lajoyce Corners for right arm compartment syndrome with a right peri clavicular hematoma. Dr Darrick Penna placed a temporary right femoral HD catheter at that time. Pt creatinine has been climbing over the past several days - 13.46 today.  HD-dependent ARF-improving creatinine. Last HD was on 08/13/12. Appears he is having recovery of renal function. Will plan to d/c permcath planned for today.  We have been asked to remove the diatek today.    Past Medical History  Diagnosis Date  . Anxiety   . Panic attacks   . Depression     FH:  Non-Contributory  History   Social History  . Marital Status: Single    Spouse Name: N/A    Number of Children: N/A  . Years of Education: N/A   Occupational History  . Not on file.   Social History Main Topics  . Smoking status: Current Every Day Smoker -- 1.0 packs/day    Types: Cigarettes  . Smokeless tobacco: Not on file  . Alcohol Use: Yes     Comment: rare  . Drug Use: Yes    Special: Cocaine     Comment: heroine  . Sexually Active:    Other Topics Concern  . Not on file   Social History Narrative  . No narrative on file    No Known Allergies  Current Facility-Administered Medications  Medication Dose Route Frequency Provider Last Rate Last Dose  . acetaminophen (TYLENOL) solution 500 mg  500 mg Oral Q6H PRN Andrena Mews, DO   500 mg at 08/14/12 1102  . cloNIDine (CATAPRES) tablet 0.2 mg  0.2 mg Oral TID Laveda Norman, MD   0.2 mg at 08/18/12 0952  . darbepoetin (ARANESP) injection 150 mcg  150 mcg Intravenous Q Mon-HD Dennie Fetters, RPH   150 mcg at 08/13/12 0747  . divalproex  (DEPAKOTE SPRINKLE) capsule 250 mg  250 mg Oral Q12H Andrena Mews, DO   250 mg at 08/18/12 0954  . famotidine (PEPCID) tablet 20 mg  20 mg Oral Daily Nelda Bucks, MD   20 mg at 08/18/12 0954  . haloperidol lactate (HALDOL) injection 5 mg  5 mg Intravenous BID PRN Oretha Milch, MD   5 mg at 08/13/12 0911  . LORazepam (ATIVAN) injection 1 mg  1 mg Intravenous Q4H PRN Laveda Norman, MD   1 mg at 08/18/12 0156  . LORazepam (ATIVAN) tablet 2 mg  2 mg Oral BID Oretha Milch, MD   2 mg at 08/18/12 0954  . methadone (DOLOPHINE) tablet 2.5 mg  2.5 mg Oral Q12H Laveda Norman, MD   2.5 mg at 08/18/12 0954  . multivitamin (RENA-VIT) tablet 1 tablet  1 tablet Oral QHS Lauren Bajbus, PHARMD   1 tablet at 08/17/12 2208  . ondansetron (ZOFRAN) injection 4 mg  4 mg Intravenous Q6H PRN Sherren Kerns, MD   4 mg at 08/13/12 0865  . oxyCODONE (Oxy IR/ROXICODONE) immediate release tablet 5 mg  5 mg Oral Q6H PRN Andrena Mews, DO   5 mg at 08/18/12 0353  . saccharomyces boulardii (FLORASTOR) capsule 250 mg  250 mg Oral BID Coralyn Helling, MD   250 mg at 08/18/12 0954  . silver sulfADIAZINE (SILVADENE) 1 % cream   Topical Daily Nadara Mustard, MD      . thiamine (VITAMIN B-1) tablet 100 mg  100 mg Oral Daily Garnetta Buddy, MD   100 mg at 08/18/12 0953  . [DISCONTINUED] 0.9 %  sodium chloride infusion   Intravenous Continuous Nelda Bucks, MD      . [DISCONTINUED] dextrose 5 %-0.9 % sodium chloride infusion   Intravenous Continuous Zada Girt, MD      . [DISCONTINUED] feeding supplement (RESOURCE BREEZE) liquid 1 Container  1 Container Oral TID BM Hettie Holstein, RD   1 Container at 08/16/12 [phone]  . [DISCONTINUED] ferric gluconate (NULECIT) 125 mg in sodium chloride 0.9 % 100 mL IVPB  125 mg Intravenous Q M,W,F-HD Nelda Bucks, MD   125 mg at 08/13/12 0750  . [DISCONTINUED] lidocaine (XYLOCAINE) 1 % injection 5 mL  5 mL Intradermal PRN Trevor Iha, MD        ROS:  See  HPI  PHYSICAL EXAM  Right upper ext. Distally limited AROM, decreased sensation, Palpable radial pulses.    Filed Vitals:   08/18/12 0949  BP: 110/83  Pulse: 88  Temp: 98.7 F (37.1 C)  Resp: 18     Lab/X-ray:  Impression: This is a 21 y.o. male here for diatek catheter removal  Plan:  Removal of left diatek catheter  Willa Rough. Thomasena Edis, PA-C [facility] [phone] 08/18/2012 10:11 AM

## 2012-08-18 NOTE — Progress Notes (Signed)
OT TREATMENT NOTE   08/17/12 1500  OT Visit Information  Last OT Received On 08/17/12  Assistance Needed +1  OT Time Calculation  OT Start Time 1445  OT Stop Time 1517  OT Time Calculation (min) 32 min  Precautions  Precautions None  ADL  Eating/Feeding Other (comment) (Given theratubing to use with R hand to encourage self feed)  Grooming Minimal assistance (will not use R hand)  Lower Body Dressing Minimal assistance;Other (comment) (taught 1 handed technique to donn socks)  Transfers/Ambulation Related to ADLs S  ADL Comments Encouraging pt to use R hand for functional tasks. 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 Given built up tubing to use wih R hand  Cognition  Overall Cognitive Status Impaired  Area of Impairment Attention;Awareness of errors;Awareness of deficits;Executive functioning  Arousal/Alertness Lethargic  Orientation Level Appears intact for tasks assessed  Behavior During Session Agitated  Current Attention Level Sustained  Attention - Other Comments internally distracted by pain  Safety/Judgement Impulsive;Decreased awareness of safety precautions  Safety/Judgement - Other Comments starts walking down hall without socks  Awareness of Errors Assistance required to correct errors made  Awareness of Errors - Other Comments poor anticipatory awareness  Executive Functioning more appropriate today, poor self monitoruin  Cognition - Other Comments inappropriate behaviors at times and decresed reasoning noted during session  Transfers  Transfers Sit to Stand;Stand to Sit  Sit to Stand 6: Modified independent (Device/Increase time)  General Exercises - Upper Extremity  Shoulder Flexion AROM;Right;5 reps;Standing  Elbow Flexion Right;AROM;Standing  Elbow Extension AROM;Right;5 reps;Standing  Shoulder Extension AROM;Right;5 reps;Standing  Digit Composite Flexion AROM;Right;10 reps;Standing;Other (comment) (absent FDP, FPL, opposition. c/o  numbness in median n distri)  Composite Extension AROM;Right;5 reps  Hand Exercises  Forearm Pronation AROM;Right;10 reps;Standing  Forearm Supination AROM;Right;10 reps;Standing  OT - End of Session  Activity Tolerance Treatment limited secondary to agitation  Patient left Other (comment) (in room)  Nurse Communication Mobility status;Other (comment) (concern over median nerve palsy)  OT Assessment/Plan  Comments on Treatment Session Improved participation during today's treatment session. Requires max encouragement to use RUE n functional tasks or during exercises. Pt reluctantly completing exerciese and after @ 7 min stated "that is enough". Participated in conversation with pt regarding need for therapy. Agreed to have pt take pain medicine, then work further on exercises THIS PM.  OT Plan Discharge plan remains appropriate  OT Frequency Min 3X/week  Recommendations for Other Services Rehab consult  Follow Up Recommendations CIR  Equipment Recommended Tub/shower seat  Acute Rehab OT Goals  OT Goal Formulation Patient unable to participate in goal setting  Time For Goal Achievement 08/21/12  Potential to Achieve Goals Good  ADL Goals  Pt Will Perform Grooming with min assist;Standing at sink  ADL Goal: Grooming - Progress Progressing toward goals  Pt Will Perform Upper Body Bathing with set-up;Sitting, chair;Sitting, edge of bed  OT General Charges  $OT Visit 1 Procedure  OT Treatments  $Self Care/Home Management  8-22 mins  $Therapeutic Exercise 8-22 mins   [facility], OTR/L  [phone] 08/17/2012

## 2012-08-18 NOTE — Progress Notes (Signed)
[facility] PROGRESS NOTE  Martin Chapman ZOX:[id number] DOB: November 05, 1990 DOA: 07/22/2012 PCP: No primary provider on file.  Assessment/Plan: 1. Acute hypoxic respiratory failure:  -Secondary to heroin overdose and alcohol ingestion  -Likely a component of aspiration pneumonitis  -Clinically stable on room air at this time  2. Hypovolemic/septic shock: -lactic acidosis, hypotension, and rhabdomyolysis have improved  -Remains hemodynamically stable  3. Rhabdomyolysis/Acute renal failure:  -Rhabdomyolysis improving with intravenous fluids  -Renal function continues to improve off of dialysis  -Last dialysis 08/13/2012  -Dialysis catheter removed 08/18/2012 4. Elevated LFT's and INR: This was likely due to shock liver. Resolved as of 08/11/12.  5. C. Difficile infection:  -Treated with 14 days of oral vancomycin  -Resolved  6. Hyperglycemia:  -Resolved  7. Acute encephalopathy: This was toxic/metabolic. There may also be an element of anoxic brain damage. EEG revealed findings consistent with mild encephalopathy, and brain MRI brain was negative for acute pathology. B12, Thiamine, ammonia, TSH, all within normal limits.HIV test and RPR were negative. Mental status is close to new baseline at this time, although patient occasionally appears disoriented and forgetful. He has had some periods of severe agitation on 08/13/12, requiring 1:1 Sitter and restraints.  -Lumbar puncture was negative for infectious etiology  -Original Psychiatrist consultation seen on 08/14/2012 by Dr.Jonnalagadda  --> Recommended Haldol and Ativan when necessary agitation  -Will need referrals to outpatient alcoholic and Narcotics Anonymous  -reconsult psychiatry again today for capacity evaluation and referral to drug rehabilitation  8. Right upper extremity injury with compartment syndrome and possible vascular injury: Dr Aldean Baker was consulted for management, and performed surgery with shoulder release  and fasciotomy of right arm on 07/22/12, followed by excision of biceps muscle right arm, neurolysis median nerve, secondary closure of triceps and biceps incision, with application of wound vac on 07/24/12. Arch aortogram in OR normal. No vascular deficit in RUE/shoulder. Managing per Dr. Lajoyce Corners and wound care. Wound vac has since been discontinued  -Right upper extremity wound with great granulation tissue--no lymphangitis, necrosis or other signs of infection  9. Oral thrush:  -Improved  10. HTN: Patient's BP has been persistently elevated, since resolution of shock, in spite of hemodialysis. Adjusting Clonidine as indicated, with improvement.  11. Pyrexia:  -No fevers in the last 96 hours  -Discontinue all antibiotics and observe  -WBC trending down 12. Metabolic encephalopathy  -Speech therapy evaluation for cognitive linguistic assessment revealedHe demonstrated adequate focused and sustained attention in a quiet, non-distracting environment; verbal recall of sequential words and problem-solving situations was normal after 2", 5", then 10" intervals. Problem-solving for hypothetical situations was functional - responses were vague but appropriate. Mental flexibility for complex reasoning tasks was more difficult, with pt showing poor persistance. Verbal impulsivity noted. Uses/understands basic humor. Pt's symptoms appear less attributable to hypoxia than to behavioral/polysubstance hx and residual encephalopathic component, but will defer to psychiatry for diagnosis. No formal SLP intervention warranted on acute level  -MRI brain negative for signs of hypoxemic encephalopathy--> shows premature cerebellar and cerebral atrophy -Psychiatry has seen the patient and deemed the patient not to have capacity to make decisions -Awaiting involuntary/voluntary commitment to drug rehabilitation facility       Disposition Plan:   Drug rehabilitation when medically  stable      Procedures/Studies: [name] Contrast  08/04/2012  *RADIOLOGY REPORT*  Clinical Data: Polysubstance abuse.  Altered mental status.  Renal failure.  MRI HEAD WITHOUT CONTRAST  Technique:  Multiplanar, multiecho pulse sequences of  the brain and surrounding structures were obtained according to standard protocol without intravenous contrast.  Comparison: CT head 04/08/2012 (normal)  Findings: The patient had difficulty remaining motionless for the study.  Images are suboptimal.  Small or subtle lesions could be overlooked.  There is no evidence for acute infarction, intracranial hemorrhage, mass lesion, hydrocephalus, or extra-axial fluid.  There may be mild premature cerebral and cerebellar atrophy for age.  There is no visible white matter disease.  No evidence for hypoxic ischemic encephalopathy.  No basal ganglia T1 shortening to suggest early hepatic encephalopathy.  Normal pituitary and cerebellar tonsils. Major intracranial flow voids are preserved.  Chronic sinus disease right division sphenoid sinus.  Slight dependent mastoid fluid bilaterally.  Because of renal failure, Gadolinium contrast could not be administered.  Meningeal enhancement or early changes of meningoencephalitis could be inapparent on this noncontrast examination.  IMPRESSION: Except for mild premature cerebral and cerebellar atrophy, there is no visible acute intracranial abnormality.  No signs of hypoxic ischemic encephalopathy or acute/subacute infarction.  No foci of chronic hemorrhage.  No evidence for hepatic encephalopathy metabolic changes.   Original Report Authenticated By: Davonna Belling, M.D.    US Renal  07/23/2012  *RADIOLOGY REPORT*  Clinical Data: Acute renal failure  RENAL/URINARY TRACT ULTRASOUND COMPLETE  Comparison:  CT abdomen pelvis - 04/08/2012; chest radiograph - 07/23/2012  Findings:  Right Kidney:  Normal cortical thickness, echogenicity and size, measuring 13.2 cm in length.  There is a  minimal amount of perinephric fluid.  No focal renal lesions.  No echogenic renal stones.  No urinary obstruction.  Left Kidney:  Normal cortical thickness, echogenicity and size, measuring 13.8 cm in length. There is a small amount of perirenal fluid.  No focal renal lesions.  No echogenic renal stones.  No urinary obstruction.  Bladder:  Decompressed with a Foley catheter  Small amount of ascites is noted within the pelvis.  There is a minimal amount of perisplenic fluid.  Small left-sided pleural effusion is incidentally noted  IMPRESSION: 1.  No sonographic explanation for patient's acute renal failure. Specifically, no evidence of urinary obstruction. 2.  Findings suggestive of volume overload/third spacing including a minimal amount of ascites, small left-sided pleural effusion and small amount of perirenal fluid, nonspecific but possibly attributable to reported history of acute renal failure.   Original Report Authenticated By: Waynard Reeds, M.D.    Dg Chest Port 1 View  08/14/2012  *RADIOLOGY REPORT*  Clinical Data: Fever  PORTABLE CHEST - 1 VIEW  Comparison: August 08, 2012  Findings:  Central catheter tip is at the junction of the superior vena cava and right atrium.  No pneumothorax.  The lungs are clear. The heart size and pulmonary vascularity are normal.  No adenopathy.  There is an old healed fracture of the left clavicle.  IMPRESSION: Lungs clear. Central catheter position unchanged.  No pneumothorax.   Original Report Authenticated By: Bretta Bang, M.D.    Dg Chest Port 1 View  08/08/2012  *RADIOLOGY REPORT*  Clinical Data: Fever  PORTABLE CHEST - 1 VIEW  Comparison: 07/31/2012  Findings: Left large bore catheter tips project over the mid/distal SVC.  Heart size upper normal to mildly enlarged.  Mild central vascular congestion. Mild peribronchial thickening. The above findings may be accentuated by hypoaeration. No confluent airspace opacity.  No pleural effusion or pneumothorax.   No acute osseous finding.  IMPRESSION: Mild peribronchial thickening is nonspecific, can be seen with bronchitis or mild edema.  No  confluent airspace opacity.   Original Report Authenticated By: Jearld Lesch, M.D.    Dg Chest Port 1 View  07/31/2012  *RADIOLOGY REPORT*  Clinical Data: Evaluate for pneumothorax.  Insertion of dialysis catheter.  PORTABLE CHEST - 1 VIEW  Comparison: Chest x-ray 07/29/2012.  Findings: There is a new left internal jugular PermCath and position with the tips terminating in the right atrium and at the superior cavoatrial junction. There is a left-sided internal jugular central venous catheter with tip terminating in the distal left innominate vein.  Nasogastric tube has been withdrawn, now with tip near the gastroesophageal junction.  Lung volumes are low. No pneumothorax.  No acute consolidative airspace disease. Crowding of the pulmonary vasculature, accentuated by low lung volumes, without frank pulmonary edema.  No pleural effusions. Heart size is normal.  Mediastinal contours are unremarkable.  IMPRESSION: 1.  New left IJ PermCath appears properly located. 2.  No definite pneumothorax following dialysis catheter placement. 3.  Tip of nasogastric tube is now at the gastroesophageal junction and could be advanced at least 8 cm for more optimal placement. Additional support apparatus, as above.   Original Report Authenticated By: Florencia Reasons, M.D.    Dg Chest Port 1 View  07/29/2012  *RADIOLOGY REPORT*  Clinical Data: Atelectasis.  PORTABLE CHEST - 1 VIEW  Comparison: 07/28/2012.  Findings: Endotracheal tube tip 1.9 cm above the carina.  Left central line tip mid superior vena cava level.  Nasogastric tube side hole just beyond the gastroesophageal junction.  No gross pneumothorax.  Mild central pulmonary vascular prominence.  No segmental consolidation.  Oblique subsegmental atelectasis right midlung.  IMPRESSION: No significant change in the subsegmental  atelectasis right midlung.   Original Report Authenticated By: Fuller Canada, M.D.    Dg Chest Port 1 View  07/28/2012  *RADIOLOGY REPORT*  Clinical Data: Drug overdose, intubated  PORTABLE CHEST - 1 VIEW  Comparison:   the previous day's study  Findings: Endotracheal tube, nasogastric tube, and left IJ central line are stable in position.  Some improvement in the perihilar edema.  Linear atelectasis or early infiltrate in the right lower lung has not significantly changed.  No effusion.  Heart size normal.  IMPRESSION:  1.  Improved   perihilar opacities. 2. Support hardware stable in position.   Original Report Authenticated By: Osa Craver, M.D.    Dg Chest Port 1 View  07/27/2012  *RADIOLOGY REPORT*  Clinical Data: Evaluate endotracheal tube.  PORTABLE CHEST - 1 VIEW  Comparison: 07/26/2012  Findings: Endotracheal tube is 4.2 cm above the carina.  Central line tip in the SVC region.  Improved aeration at the left lung base.  No focal airspace disease.  Heart size is normal. Nasogastric tube in the stomach body region.  IMPRESSION: Improved aeration at the left lung base.  Support apparatuses as described.   Original Report Authenticated By: Richarda Overlie, M.D.    Dg Chest Port 1 View  07/26/2012  *RADIOLOGY REPORT*  Clinical Data: Atelectasis.  Shortness of breath.  PORTABLE CHEST - 1 VIEW  Comparison: Chest x-ray 07/25/2012.  Findings: An endotracheal tube is in place with tip 3.2 cm above the carina. There is a left-sided internal jugular central venous catheter with tip terminating in the superior cavoatrial junction. Lung volumes are low.  Bibasilar opacities (left greater than right), favored to represent areas of subsegmental atelectasis. Blunting of the left costophrenic sulcus favored to reflect a small left pleural effusion.  Crowding of the  pulmonary vasculature, accentuated by low lung volumes.  Heart size is within normal limits. The patient is rotated to the right on today's  exam, resulting in distortion of the mediastinal contours and reduced diagnostic sensitivity and specificity for mediastinal pathology.  IMPRESSION: 1.  Support apparatus, as above. 2.  Low lung volumes with worsening bibasilar atelectasis and new small left pleural effusion.   Original Report Authenticated By: Florencia Reasons, M.D.    Dg Chest Port 1 View  07/25/2012  *RADIOLOGY REPORT*  Clinical Data: Intubated patient  PORTABLE CHEST - 1 VIEW  Comparison: Chest radiograph 07/24/2012  Findings: Endotracheal tube is 2.2 cm from carina.  Left-sided central venous line is unchanged in position.  Stable cardiac silhouette.  Lungs are clear.  No pneumothorax.  IMPRESSION: Endotracheal tube 2.2 cm from carina.   Original Report Authenticated By: Genevive Bi, M.D.    Dg Chest Port 1 View  07/24/2012  *RADIOLOGY REPORT*  Clinical Data: Evaluate endotracheal tube position  PORTABLE CHEST - 1 VIEW  Comparison: Chest radiograph 07/23/2012 at 1:08 hours  Findings: Current radiograph is at 5:47 hours.  The patient is markedly rotated to the right on the first image.  A second image was performed, without significant rotation. The endotracheal tube is approximately 4 cm above the carina, and at the level of the clavicular heads.  Left IJ central venous catheter terminates in the distal superior vena cava.  Nasogastric tube courses into the stomach and continues below the edge of the image.  Lung volumes are low.  Heart size is normal. There are bilateral hazy opacities at the lung bases and costophrenic angles, consistent with small bilateral pleural effusions. Aeration of the right upper lung field is improved compared to the chest radiograph performed 07/23/2012.  IMPRESSION: Satisfactory position of endotracheal tube.  Small bilateral pleural effusions and low lung volumes. Improved aeration of the right upper lung field compared to the 07/23/2012 chest radiograph.   Original Report Authenticated By: Britta Mccreedy, M.D.    Dg Chest Port 1 View  07/23/2012  *RADIOLOGY REPORT*  Clinical Data: Respiratory failure and overdose.  PORTABLE CHEST - 1 VIEW  Comparison: 07/22/2012  Findings: Since the prior study, the patient has been intubated with endotracheal tube tip approximately 2 cm above the carina. Left jugular central line tip lies in the lower SVC.  A nasogastric tube extends into the proximal stomach.  Lung volumes are low with interval development of mild pulmonary edema and potentially additional infiltrate in the right lung.  No significant pleural effusions are identified.  Heart size is within normal limits.  IMPRESSION: Development of mild pulmonary edema and potentially also infiltrate of the right lung.   Original Report Authenticated By: Reola Calkins, M.D.    Dg Chest Portable 1 View  07/22/2012  *RADIOLOGY REPORT*  Clinical Data: Drug overdose.  PORTABLE CHEST - 1 VIEW  Comparison: 04/08/2012  Findings: Heart size and pulmonary vascularity are normal and the lungs are clear.  No osseous abnormality.  IMPRESSION: Normal chest.   Original Report Authenticated By: Gwynn Burly, M.D.    Dg Shoulder Right Port  07/22/2012  *RADIOLOGY REPORT*  Clinical Data: Right shoulder pain.  PORTABLE RIGHT SHOULDER - 2+ VIEW  Comparison: None.  Findings: No fracture, dislocation, or other abnormality.  IMPRESSION: Normal exam.   Original Report Authenticated By: Gwynn Burly, M.D.          Subjective: Patient continues to refuse occupational therapy. He denies any fevers,  chills, chest pain, shortness breath, nausea, vomiting, diarrhea, abdominal pain.  Objective: Filed Vitals:   08/17/12 1743 08/17/12 2159 08/18/12 0554 08/18/12 0949  BP: 115/72 107/70 117/67 110/83  Pulse: 74 67 68 88  Temp: 98.5 F (36.9 C) 98.9 F (37.2 C) 98.1 F (36.7 C) 98.7 F (37.1 C)  TempSrc: Oral Oral Oral Oral  Resp: 10 16 18 18   Height:      Weight:  68.04 kg (150 lb)    SpO2: 98% 98% 95% 100%     Intake/Output Summary (Last 24 hours) at 08/18/12 1424 Last data filed at 08/18/12 0816  Gross per 24 hour  Intake   1200 ml  Output      0 ml  Net   1200 ml   Weight change: 5.34 kg (11 lb 12.4 oz) Exam:   General:  Pt is alert, follows commands appropriately, not in acute distress  HEENT: No icterus, No thrush,[place]/AT  Cardiovascular: RRR, S1/S2, no rubs, no gallops  Respiratory: CTA bilaterally, no wheezing, no crackles, no rhonchi  Abdomen: Soft/+BS, non tender, non distended, no guarding  Extremities: No edema, No lymphangitis, No petechiae, No rashes, no synovitis; right upper extremity without any erythema, drainage, necrosis  Data Reviewed: Basic Metabolic Panel:  Lab 08/18/12 1610 08/17/12 0620 08/16/12 0545 08/15/12 0550 08/14/12 0605  NA 135 134* 133* 136 134*  K 4.2 4.3 4.2 4.1 4.0  CL 95* 93* 91* 95* 92*  CO2 27 26 27 27 28   GLUCOSE 110* 103* 96 92 101*  BUN 39* 40* 38* 31* 23  CREATININE 3.26* 3.32* 3.75* 3.94* 3.97*  CALCIUM 9.6 9.3 9.3 9.2 9.7  MG -- -- -- -- --  PHOS 4.6 5.2* 5.3* 5.3* 4.5   Liver Function Tests:  Lab 08/18/12 0430 08/17/12 0620 08/16/12 0545 08/15/12 0550 08/14/12 0605  AST -- -- -- -- --  ALT -- -- -- -- --  ALKPHOS -- -- -- -- --  BILITOT -- -- -- -- --  PROT -- -- -- -- --  ALBUMIN 3.6 3.4* 3.4* 3.4* 3.7   No results found for this basename: LIPASE:5,AMYLASE:5 in the last 168 hours No results found for this basename: AMMONIA:5 in the last 168 hours CBC:  Lab 08/18/12 0430 08/16/12 0545 08/15/12 0550 08/14/12 0605 08/13/12 0650  WBC 9.6 10.6* 10.9* 12.1* 15.4*  NEUTROABS -- 6.7 -- -- --  HGB 9.2* 8.6* 8.4* 8.5* 8.1*  HCT 27.2* 25.9* 25.3* 24.8* 24.4*  MCV 88.6 86.6 85.2 87.0 84.4  PLT 536* 515* 495* 531* 647*   Cardiac Enzymes: No results found for this basename: CKTOTAL:5,CKMB:5,CKMBINDEX:5,TROPONINI:5 in the last 168 hours BNP: No components found with this basename: POCBNP:5 CBG: No results found for this  basename: GLUCAP:5 in the last 168 hours  Recent Results (from the past 240 hour(s))  CULTURE, BLOOD (ROUTINE X 2)     Status: Normal (Preliminary result)   Collection Time   08/14/12 10:05 AM      Component Value Range Status Comment   Specimen Description BLOOD RIGHT ANTECUBITAL   Final    Special Requests BOTTLES DRAWN AEROBIC ONLY 3CC   Final    Culture  Setup Time 08/14/2012 16:51   Final    Culture     Final    Value:        BLOOD CULTURE RECEIVED NO GROWTH TO DATE CULTURE WILL BE HELD FOR 5 DAYS BEFORE ISSUING A FINAL NEGATIVE REPORT   Report Status PENDING   Incomplete  CULTURE, BLOOD (ROUTINE X 2)     Status: Normal (Preliminary result)   Collection Time   08/14/12 10:15 AM      Component Value Range Status Comment   Specimen Description BLOOD RIGHT HAND   Final    Special Requests BOTTLES DRAWN AEROBIC ONLY 8CC   Final    Culture  Setup Time 08/14/2012 16:51   Final    Culture     Final    Value:        BLOOD CULTURE RECEIVED NO GROWTH TO DATE CULTURE WILL BE HELD FOR 5 DAYS BEFORE ISSUING A FINAL NEGATIVE REPORT   Report Status PENDING   Incomplete   URINE CULTURE     Status: Normal   Collection Time   08/14/12  9:29 PM      Component Value Range Status Comment   Specimen Description URINE, RANDOM   Final    Special Requests NONE   Final    Culture  Setup Time 08/14/2012 23:01   Final    Colony Count NO GROWTH   Final    Culture NO GROWTH   Final    Report Status 08/16/2012 FINAL   Final      Scheduled Meds:   . cloNIDine  0.2 mg Oral TID  . darbepoetin (ARANESP) injection - DIALYSIS  150 mcg Intravenous Q Mon-HD  . divalproex  250 mg Oral Q12H  . famotidine  20 mg Oral Daily  . LORazepam  2 mg Oral BID  . methadone  2.5 mg Oral Q12H  . multivitamin  1 tablet Oral QHS  . saccharomyces boulardii  250 mg Oral BID  . silver sulfADIAZINE   Topical Daily  . thiamine  100 mg Oral Daily   Continuous Infusions:    Keiden Deskin, DO  [facility] Pager  [phone]  If 7PM-7AM, please contact night-coverage [email or web address] Password [email or web address] 08/18/2012, 2:24 PM   LOS: 27 days

## 2012-08-18 NOTE — Progress Notes (Signed)
OT Cancellation Note  Patient Details Name: Martin Chapman MRN: [id number] DOB: [birth year]   Cancelled Treatment:    Reason Eval/Treat Not Completed: Other (comment) (pt refused)  [facility] Breslin Burklow, OTR/L  [phone] 08/18/2012 08/18/2012, 12:14 PM

## 2012-08-18 NOTE — Progress Notes (Signed)
[facility] Catheter Removal Procedure Note  Diagnosis: ESRD with improving renal function  Plan:  Remove left diatek catheter  Consent signed:  yes Time out completed:  yes Coumadin:  no PT/INR (if applicable):   Other labs:   Procedure: 1.  Sterile prepping and draping over catheter area 2. 0 ml 2% lidocaine plain instilled at removal site. 3.  left catheter removed in its entirety with cuff in tact. 4.  Complications: none  5. Tip of catheter sent for culture:  no   Patient tolerated procedure well:  yes Pressure held, no bleeding noted, dressing applied Instructions given to the pt regarding wound care and bleeding.  OtherClinton Gallant [facility] 08/18/2012 10:10 AM

## 2012-08-18 NOTE — Progress Notes (Signed)
OT TREATMENT NOTE  Returned after pt received pain meds to complete RUE exercises due to c/o pain.   08/17/12 1530  OT Visit Information  Last OT Received On 08/17/12  Assistance Needed +1  OT Time Calculation  OT Start Time 1540  OT Stop Time 1605  OT Time Calculation (min) 25 min  Cognition  Overall Cognitive Status Impaired  General Exercises - Upper Extremity  Shoulder Flexion AROM;10 reps;Standing  Elbow Extension AROM;10 reps;Standing  Digit Composite Flexion AROM;Right;10 reps;Standing;Other (comment) (given theraputty (*yellow) exercises)  Composite Extension AROM;Right;10 reps  Hand Exercises  Forearm Pronation AROM;Right;10 reps  Forearm Supination AROM;Right;10 reps  Other Exercises  Other Exercises lmitations in full supination /pronation  Other Exercises Attempted to educate pt in therputty exercises for grip strenthening and pinch.   Other Exercises Pt may benefit from oppositional splint to increase functional use of R hand. ? compliance.  Other Exercises Attempted to have pt participate in sustained passive stretch with RUE in shoulder flexion and elbow and wrist extension. Pt stated it hurt too much and that was "enough".   Other Exercises MD present at beginning of session. Bandage changed. granuation bed in wound. redressed with silva cream and kerlex. Encouraged tpt to complete stretches independently.   OT - End of Session  Activity Tolerance Patient limited by pain (although pt premedicated. pt asked MD for more pain meds)  Patient left Other (comment) (with nursing)  Nurse Communication Mobility status  OT Assessment/Plan  Comments on Treatment Session Pt with limited participation with RUE rehab, however, improved from previous sessions. May benefit from R handbased oppositional splint. ? compliance with use. will continue to assess. Pt will need continued OT for RUE in next venue. Rec psych consult to assess behavior. Pt with high level cognitive deficits.  ? Pt's baseline cognitive level. If this is pt's baseline, then CIR is not appropriate due to pt's behavior and recommend pt D/C to different venue - drug rehab/behavioral health.  OT Plan Discharge plan remains appropriate  OT Frequency Min 3X/week  Recommendations for Other Services Other (comment)  Equipment Recommended Tub/shower seat  Acute Rehab OT Goals  OT Goal Formulation Patient unable to participate in goal setting  Time For Goal Achievement 08/21/12  Potential to Achieve Goals Good  ADL Goals  Pt Will Perform Grooming with min assist;Standing at sink  ADL Goal: Grooming - Progress Met  Pt Will Perform Upper Body Bathing with set-up;Sitting, chair;Sitting, edge of bed  ADL Goal: Upper Body Bathing - Progress Met  Pt Will Perform Lower Body Bathing with supervision;Sit to stand from chair;Sit to stand from bed  ADL Goal: Lower Body Bathing - Progress Met  Pt Will Perform Lower Body Dressing with supervision;Sit to stand from chair;Sit to stand from bed  ADL Goal: Lower Body Dressing - Progress Progressing toward goals  Pt Will Transfer to Toilet with supervision;Ambulation;with DME  ADL Goal: Toilet Transfer - Progress Met  Pt Will Perform Toileting - Clothing Manipulation with supervision;Standing  ADL Goal: Toileting - Clothing Manipulation - Progress Met  Additional ADL Goal #1 Pt. will demonstrate full AROM Rt. UE as needed for ADLs.  ADL Goal: Additional Goal #1 - Progress Progressing toward goals  Additional ADL Goal #2 Pt will demonstrate sustained attention during ADL task with min vc for redirection in nondistracting enviromnment.  ADL Goal: Additional Goal #2 - Progress Progressing toward goals  Miscellaneous OT Goals  Miscellaneous OT Goal #1 Pt will demonstrate emergent awareness during functional ADL task  with min vc.  OT Goal: Miscellaneous Goal #1 - Progress Met  OT General Charges  $OT Visit 1 Procedure  OT Treatments  $Therapeutic Exercise 23-37 mins    [facility], OTR/L  [phone] 08/17/2012

## 2012-08-19 LAB — RENAL FUNCTION PANEL
CO2: 30 mEq/L (ref 19–32)
Calcium: 9.7 mg/dL (ref 8.4–10.5)
Chloride: 97 mEq/L (ref 96–112)
GFR calc Af Amer: 32 mL/min — ABNORMAL LOW (ref >90)
Glucose, Bld: 89 mg/dL (ref 70–99)
Sodium: 138 mEq/L (ref 135–145)

## 2012-08-19 MED ORDER — SILVER SULFADIAZINE 1 % EX CREA
TOPICAL_CREAM | Freq: Every day | CUTANEOUS | Status: DC
  Administered 2012-08-19 – 2012-08-20 (×2): via TOPICAL
  Filled 2012-08-19: qty 85

## 2012-08-19 MED ORDER — LORAZEPAM 1 MG PO TABS
1.0000 mg | ORAL_TABLET | Freq: Once | ORAL | Status: AC
  Administered 2012-08-19: 1 mg via ORAL
  Filled 2012-08-19: qty 1

## 2012-08-19 NOTE — Progress Notes (Signed)
[facility] PROGRESS NOTE  Martin Chapman ZOX:[id number] DOB: 04/08/1991 DOA: 07/22/2012 PCP: No primary provider on file.  Assessment/Plan: 1. Acute hypoxic respiratory failure:  -Secondary to heroin overdose and alcohol ingestion  -Likely a component of aspiration pneumonitis  -Clinically stable on room air at this time  2. Hypovolemic/septic shock: -lactic acidosis, hypotension, and rhabdomyolysis have improved  -Remains hemodynamically stable  3. Rhabdomyolysis/Acute renal failure:  -Rhabdomyolysis improving with intravenous fluids  -Renal function continues to improve off of dialysis  -Last dialysis 08/13/2012  -Dialysis catheter removed 08/18/2012 4. Elevated LFT's and INR: This was likely due to shock liver. Resolved as of 08/11/12.  5. C. Difficile infection:  -Treated with 14 days of oral vancomycin  -Resolved  6. Hyperglycemia:  -Resolved  7. Acute encephalopathy: This was toxic/metabolic. There may also be an element of anoxic brain damage. EEG revealed findings consistent with mild encephalopathy, and brain MRI brain was negative for acute pathology. B12, Thiamine, ammonia, TSH, all within normal limits.HIV test and RPR were negative. Mental status is close to new baseline at this time, although patient occasionally appears disoriented and forgetful. He has had some periods of severe agitation on 08/13/12, requiring 1:1 Sitter and restraints.  -Lumbar puncture was negative for infectious etiology  -Original Psychiatrist consultation seen on 08/14/2012 by Dr.Jonnalagadda  --> Recommended Haldol and Ativan when necessary agitation  -Will need referrals to outpatient alcoholic and Narcotics Anonymous  -Psychiatry has declared the patient having full capacity to make his own medical decisions -Psychiatry states patient doesn't meet criteria for substance detox tx, but he will required long-term rehabilitation services. Patient has willingness to go to [facility]. 8.  Right upper extremity injury with compartment syndrome and possible vascular injury: Dr Aldean Baker was consulted for management, and performed surgery with shoulder release and fasciotomy of right arm on 07/22/12, followed by excision of biceps muscle right arm, neurolysis median nerve, secondary closure of triceps and biceps incision, with application of wound vac on 07/24/12. Arch aortogram in OR normal. No vascular deficit in RUE/shoulder. Managing per Dr. Lajoyce Corners and wound care. Wound vac has since been discontinued  -Right upper extremity wound with great granulation tissue--no lymphangitis, necrosis or other signs of infection  -Sutures removed 08/19/2012.--Continue wound care 9. Oral thrush:  -Improved  10. HTN: Patient's BP has been persistently elevated, since resolution of shock, in spite of hemodialysis. Adjusting Clonidine as indicated, with improvement.  11. Pyrexia:  -No fevers in the last 96 hours  -Discontinue all antibiotics and observe  12. Metabolic encephalopathy  -Speech therapy evaluation for cognitive linguistic assessment revealedHe demonstrated adequate focused and sustained attention in a quiet, non-distracting environment; verbal recall of sequential words and problem-solving situations was normal after 2", 5", then 10" intervals. Problem-solving for hypothetical situations was functional - responses were vague but appropriate. Mental flexibility for complex reasoning tasks was more difficult, with pt showing poor persistance. Verbal impulsivity noted. Uses/understands basic humor. Pt's symptoms appear less attributable to hypoxia than to behavioral/polysubstance hx and residual encephalopathic component, but will defer to psychiatry for diagnosis. No formal SLP intervention warranted on acute level  -MRI brain negative for signs of hypoxemic encephalopathy--> shows premature cerebellar and cerebral atrophy     Family Communication:  mother at beside--updated Disposition  Plan:   Hopefully drug rehab placement this week     Procedures/Studies: [name] Contrast  08/04/2012  *RADIOLOGY REPORT*  Clinical Data: Polysubstance abuse.  Altered mental status.  Renal failure.  MRI HEAD WITHOUT  CONTRAST  Technique:  Multiplanar, multiecho pulse sequences of the brain and surrounding structures were obtained according to standard protocol without intravenous contrast.  Comparison: CT head 04/08/2012 (normal)  Findings: The patient had difficulty remaining motionless for the study.  Images are suboptimal.  Small or subtle lesions could be overlooked.  There is no evidence for acute infarction, intracranial hemorrhage, mass lesion, hydrocephalus, or extra-axial fluid.  There may be mild premature cerebral and cerebellar atrophy for age.  There is no visible white matter disease.  No evidence for hypoxic ischemic encephalopathy.  No basal ganglia T1 shortening to suggest early hepatic encephalopathy.  Normal pituitary and cerebellar tonsils. Major intracranial flow voids are preserved.  Chronic sinus disease right division sphenoid sinus.  Slight dependent mastoid fluid bilaterally.  Because of renal failure, Gadolinium contrast could not be administered.  Meningeal enhancement or early changes of meningoencephalitis could be inapparent on this noncontrast examination.  IMPRESSION: Except for mild premature cerebral and cerebellar atrophy, there is no visible acute intracranial abnormality.  No signs of hypoxic ischemic encephalopathy or acute/subacute infarction.  No foci of chronic hemorrhage.  No evidence for hepatic encephalopathy metabolic changes.   Original Report Authenticated By: Davonna Belling, M.D.    US Renal  07/23/2012  *RADIOLOGY REPORT*  Clinical Data: Acute renal failure  RENAL/URINARY TRACT ULTRASOUND COMPLETE  Comparison:  CT abdomen pelvis - 04/08/2012; chest radiograph - 07/23/2012  Findings:  Right Kidney:  Normal cortical thickness, echogenicity and size,  measuring 13.2 cm in length.  There is a minimal amount of perinephric fluid.  No focal renal lesions.  No echogenic renal stones.  No urinary obstruction.  Left Kidney:  Normal cortical thickness, echogenicity and size, measuring 13.8 cm in length. There is a small amount of perirenal fluid.  No focal renal lesions.  No echogenic renal stones.  No urinary obstruction.  Bladder:  Decompressed with a Foley catheter  Small amount of ascites is noted within the pelvis.  There is a minimal amount of perisplenic fluid.  Small left-sided pleural effusion is incidentally noted  IMPRESSION: 1.  No sonographic explanation for patient's acute renal failure. Specifically, no evidence of urinary obstruction. 2.  Findings suggestive of volume overload/third spacing including a minimal amount of ascites, small left-sided pleural effusion and small amount of perirenal fluid, nonspecific but possibly attributable to reported history of acute renal failure.   Original Report Authenticated By: Waynard Reeds, M.D.    Dg Chest Port 1 View  08/14/2012  *RADIOLOGY REPORT*  Clinical Data: Fever  PORTABLE CHEST - 1 VIEW  Comparison: August 08, 2012  Findings:  Central catheter tip is at the junction of the superior vena cava and right atrium.  No pneumothorax.  The lungs are clear. The heart size and pulmonary vascularity are normal.  No adenopathy.  There is an old healed fracture of the left clavicle.  IMPRESSION: Lungs clear. Central catheter position unchanged.  No pneumothorax.   Original Report Authenticated By: Bretta Bang, M.D.    Dg Chest Port 1 View  08/08/2012  *RADIOLOGY REPORT*  Clinical Data: Fever  PORTABLE CHEST - 1 VIEW  Comparison: 07/31/2012  Findings: Left large bore catheter tips project over the mid/distal SVC.  Heart size upper normal to mildly enlarged.  Mild central vascular congestion. Mild peribronchial thickening. The above findings may be accentuated by hypoaeration. No confluent airspace  opacity.  No pleural effusion or pneumothorax.  No acute osseous finding.  IMPRESSION: Mild peribronchial thickening is nonspecific, can  be seen with bronchitis or mild edema.  No confluent airspace opacity.   Original Report Authenticated By: Jearld Lesch, M.D.    Dg Chest Port 1 View  07/31/2012  *RADIOLOGY REPORT*  Clinical Data: Evaluate for pneumothorax.  Insertion of dialysis catheter.  PORTABLE CHEST - 1 VIEW  Comparison: Chest x-ray 07/29/2012.  Findings: There is a new left internal jugular PermCath and position with the tips terminating in the right atrium and at the superior cavoatrial junction. There is a left-sided internal jugular central venous catheter with tip terminating in the distal left innominate vein.  Nasogastric tube has been withdrawn, now with tip near the gastroesophageal junction.  Lung volumes are low. No pneumothorax.  No acute consolidative airspace disease. Crowding of the pulmonary vasculature, accentuated by low lung volumes, without frank pulmonary edema.  No pleural effusions. Heart size is normal.  Mediastinal contours are unremarkable.  IMPRESSION: 1.  New left IJ PermCath appears properly located. 2.  No definite pneumothorax following dialysis catheter placement. 3.  Tip of nasogastric tube is now at the gastroesophageal junction and could be advanced at least 8 cm for more optimal placement. Additional support apparatus, as above.   Original Report Authenticated By: Florencia Reasons, M.D.    Dg Chest Port 1 View  07/29/2012  *RADIOLOGY REPORT*  Clinical Data: Atelectasis.  PORTABLE CHEST - 1 VIEW  Comparison: 07/28/2012.  Findings: Endotracheal tube tip 1.9 cm above the carina.  Left central line tip mid superior vena cava level.  Nasogastric tube side hole just beyond the gastroesophageal junction.  No gross pneumothorax.  Mild central pulmonary vascular prominence.  No segmental consolidation.  Oblique subsegmental atelectasis right midlung.  IMPRESSION: No  significant change in the subsegmental atelectasis right midlung.   Original Report Authenticated By: Fuller Canada, M.D.    Dg Chest Port 1 View  07/28/2012  *RADIOLOGY REPORT*  Clinical Data: Drug overdose, intubated  PORTABLE CHEST - 1 VIEW  Comparison:   the previous day's study  Findings: Endotracheal tube, nasogastric tube, and left IJ central line are stable in position.  Some improvement in the perihilar edema.  Linear atelectasis or early infiltrate in the right lower lung has not significantly changed.  No effusion.  Heart size normal.  IMPRESSION:  1.  Improved   perihilar opacities. 2. Support hardware stable in position.   Original Report Authenticated By: Osa Craver, M.D.    Dg Chest Port 1 View  07/27/2012  *RADIOLOGY REPORT*  Clinical Data: Evaluate endotracheal tube.  PORTABLE CHEST - 1 VIEW  Comparison: 07/26/2012  Findings: Endotracheal tube is 4.2 cm above the carina.  Central line tip in the SVC region.  Improved aeration at the left lung base.  No focal airspace disease.  Heart size is normal. Nasogastric tube in the stomach body region.  IMPRESSION: Improved aeration at the left lung base.  Support apparatuses as described.   Original Report Authenticated By: Richarda Overlie, M.D.    Dg Chest Port 1 View  07/26/2012  *RADIOLOGY REPORT*  Clinical Data: Atelectasis.  Shortness of breath.  PORTABLE CHEST - 1 VIEW  Comparison: Chest x-ray 07/25/2012.  Findings: An endotracheal tube is in place with tip 3.2 cm above the carina. There is a left-sided internal jugular central venous catheter with tip terminating in the superior cavoatrial junction. Lung volumes are low.  Bibasilar opacities (left greater than right), favored to represent areas of subsegmental atelectasis. Blunting of the left costophrenic sulcus favored to reflect  a small left pleural effusion.  Crowding of the pulmonary vasculature, accentuated by low lung volumes.  Heart size is within normal limits. The patient  is rotated to the right on today's exam, resulting in distortion of the mediastinal contours and reduced diagnostic sensitivity and specificity for mediastinal pathology.  IMPRESSION: 1.  Support apparatus, as above. 2.  Low lung volumes with worsening bibasilar atelectasis and new small left pleural effusion.   Original Report Authenticated By: Florencia Reasons, M.D.    Dg Chest Port 1 View  07/25/2012  *RADIOLOGY REPORT*  Clinical Data: Intubated patient  PORTABLE CHEST - 1 VIEW  Comparison: Chest radiograph 07/24/2012  Findings: Endotracheal tube is 2.2 cm from carina.  Left-sided central venous line is unchanged in position.  Stable cardiac silhouette.  Lungs are clear.  No pneumothorax.  IMPRESSION: Endotracheal tube 2.2 cm from carina.   Original Report Authenticated By: Genevive Bi, M.D.    Dg Chest Port 1 View  07/24/2012  *RADIOLOGY REPORT*  Clinical Data: Evaluate endotracheal tube position  PORTABLE CHEST - 1 VIEW  Comparison: Chest radiograph 07/23/2012 at 1:08 hours  Findings: Current radiograph is at 5:47 hours.  The patient is markedly rotated to the right on the first image.  A second image was performed, without significant rotation. The endotracheal tube is approximately 4 cm above the carina, and at the level of the clavicular heads.  Left IJ central venous catheter terminates in the distal superior vena cava.  Nasogastric tube courses into the stomach and continues below the edge of the image.  Lung volumes are low.  Heart size is normal. There are bilateral hazy opacities at the lung bases and costophrenic angles, consistent with small bilateral pleural effusions. Aeration of the right upper lung field is improved compared to the chest radiograph performed 07/23/2012.  IMPRESSION: Satisfactory position of endotracheal tube.  Small bilateral pleural effusions and low lung volumes. Improved aeration of the right upper lung field compared to the 07/23/2012 chest radiograph.   Original  Report Authenticated By: Britta Mccreedy, M.D.    Dg Chest Port 1 View  07/23/2012  *RADIOLOGY REPORT*  Clinical Data: Respiratory failure and overdose.  PORTABLE CHEST - 1 VIEW  Comparison: 07/22/2012  Findings: Since the prior study, the patient has been intubated with endotracheal tube tip approximately 2 cm above the carina. Left jugular central line tip lies in the lower SVC.  A nasogastric tube extends into the proximal stomach.  Lung volumes are low with interval development of mild pulmonary edema and potentially additional infiltrate in the right lung.  No significant pleural effusions are identified.  Heart size is within normal limits.  IMPRESSION: Development of mild pulmonary edema and potentially also infiltrate of the right lung.   Original Report Authenticated By: Reola Calkins, M.D.    Dg Chest Portable 1 View  07/22/2012  *RADIOLOGY REPORT*  Clinical Data: Drug overdose.  PORTABLE CHEST - 1 VIEW  Comparison: 04/08/2012  Findings: Heart size and pulmonary vascularity are normal and the lungs are clear.  No osseous abnormality.  IMPRESSION: Normal chest.   Original Report Authenticated By: Gwynn Burly, M.D.    Dg Shoulder Right Port  07/22/2012  *RADIOLOGY REPORT*  Clinical Data: Right shoulder pain.  PORTABLE RIGHT SHOULDER - 2+ VIEW  Comparison: None.  Findings: No fracture, dislocation, or other abnormality.  IMPRESSION: Normal exam.   Original Report Authenticated By: Gwynn Burly, M.D.          Subjective: Patient  was seen with patient's mother present in the room. Patient denies any fevers, chills, chest pain, shortness breath, vomiting, diarrhea, abdominal pain. He complains of some pain in his right arm especially with therapies. He confirms that he has been refusing therapy. He denies any headaches, dizziness, rashes. He states that he is willing to go to a drug rehabilitation program.   Objective: Filed Vitals:   08/18/12 1838 08/18/12 2037 08/19/12 0635  08/19/12 0930  BP: 116/74 116/75 118/78 125/84  Pulse: 70 69 66 84  Temp: 98.3 F (36.8 C) 98.8 F (37.1 C) 98.1 F (36.7 C) 98.9 F (37.2 C)  TempSrc: Oral Oral Oral Oral  Resp: 18 16 18 18   Height:  5\' 7"  (1.702 m)    Weight:  61.326 kg (135 lb 3.2 oz)    SpO2: 97% 98% 99% 99%    Intake/Output Summary (Last 24 hours) at 08/19/12 1316 Last data filed at 08/19/12 1145  Gross per 24 hour  Intake   1500 ml  Output    300 ml  Net   1200 ml   Weight change: -6.713 kg (-14 lb 12.8 oz) Exam:   General:  Pt is alert, follows commands appropriately, not in acute distress  HEENT: No icterus, No thrush, [place]/AT  Cardiovascular: RRR, S1/S2, no rubs, no gallops  Respiratory: CTA bilaterally, no wheezing, no crackles, no rhonchi  Abdomen: Soft/+BS, non tender, non distended, no guarding  Extremities: No edema, No lymphangitis, No petechiae, No rashes, no synovitis; right upper extremity without any erythema, drainage, necrosis. Pulses intact.  Data Reviewed: Basic Metabolic Panel:  Lab 08/19/12 1610 08/18/12 0430 08/17/12 0620 08/16/12 0545 08/15/12 0550  NA 138 135 134* 133* 136  K 4.6 4.2 4.3 4.2 4.1  CL 97 95* 93* 91* 95*  CO2 30 27 26 27 27   GLUCOSE 89 110* 103* 96 92  BUN 38* 39* 40* 38* 31*  CREATININE 3.03* 3.26* 3.32* 3.75* 3.94*  CALCIUM 9.7 9.6 9.3 9.3 9.2  MG -- -- -- -- --  PHOS 4.7* 4.6 5.2* 5.3* 5.3*   Liver Function Tests:  Lab 08/19/12 0535 08/18/12 0430 08/17/12 0620 08/16/12 0545 08/15/12 0550  AST -- -- -- -- --  ALT -- -- -- -- --  ALKPHOS -- -- -- -- --  BILITOT -- -- -- -- --  PROT -- -- -- -- --  ALBUMIN 3.6 3.6 3.4* 3.4* 3.4*   No results found for this basename: LIPASE:5,AMYLASE:5 in the last 168 hours No results found for this basename: AMMONIA:5 in the last 168 hours CBC:  Lab 08/18/12 0430 08/16/12 0545 08/15/12 0550 08/14/12 0605 08/13/12 0650  WBC 9.6 10.6* 10.9* 12.1* 15.4*  NEUTROABS -- 6.7 -- -- --  HGB 9.2* 8.6* 8.4* 8.5* 8.1*    HCT 27.2* 25.9* 25.3* 24.8* 24.4*  MCV 88.6 86.6 85.2 87.0 84.4  PLT 536* 515* 495* 531* 647*   Cardiac Enzymes: No results found for this basename: CKTOTAL:5,CKMB:5,CKMBINDEX:5,TROPONINI:5 in the last 168 hours BNP: No components found with this basename: POCBNP:5 CBG: No results found for this basename: GLUCAP:5 in the last 168 hours  Recent Results (from the past 240 hour(s))  CULTURE, BLOOD (ROUTINE X 2)     Status: Normal (Preliminary result)   Collection Time   08/14/12 10:05 AM      Component Value Range Status Comment   Specimen Description BLOOD RIGHT ANTECUBITAL   Final    Special Requests BOTTLES DRAWN AEROBIC ONLY 3CC   Final  Culture  Setup Time 08/14/2012 16:51   Final    Culture     Final    Value:        BLOOD CULTURE RECEIVED NO GROWTH TO DATE CULTURE WILL BE HELD FOR 5 DAYS BEFORE ISSUING A FINAL NEGATIVE REPORT   Report Status PENDING   Incomplete   CULTURE, BLOOD (ROUTINE X 2)     Status: Normal (Preliminary result)   Collection Time   08/14/12 10:15 AM      Component Value Range Status Comment   Specimen Description BLOOD RIGHT HAND   Final    Special Requests BOTTLES DRAWN AEROBIC ONLY 8CC   Final    Culture  Setup Time 08/14/2012 16:51   Final    Culture     Final    Value:        BLOOD CULTURE RECEIVED NO GROWTH TO DATE CULTURE WILL BE HELD FOR 5 DAYS BEFORE ISSUING A FINAL NEGATIVE REPORT   Report Status PENDING   Incomplete   URINE CULTURE     Status: Normal   Collection Time   08/14/12  9:29 PM      Component Value Range Status Comment   Specimen Description URINE, RANDOM   Final    Special Requests NONE   Final    Culture  Setup Time 08/14/2012 23:01   Final    Colony Count NO GROWTH   Final    Culture NO GROWTH   Final    Report Status 08/16/2012 FINAL   Final      Scheduled Meds:   . cloNIDine  0.2 mg Oral TID  . darbepoetin (ARANESP) injection - DIALYSIS  150 mcg Intravenous Q Mon-HD  . divalproex  250 mg Oral Q12H  . famotidine  20  mg Oral Daily  . [COMPLETED] LORazepam  1 mg Oral Once  . LORazepam  2 mg Oral BID  . methadone  2.5 mg Oral Q12H  . multivitamin  1 tablet Oral QHS  . saccharomyces boulardii  250 mg Oral BID  . silver sulfADIAZINE   Topical Daily  . thiamine  100 mg Oral Daily  . [DISCONTINUED] silver sulfADIAZINE   Topical Daily   Continuous Infusions:  Total time 35 min.   Jahvier Aldea, DO  [facility] Pager [phone]  If 7PM-7AM, please contact night-coverage [email or web address] Password [email or web address] 08/19/2012, 1:16 PM   LOS: 28 days

## 2012-08-19 NOTE — Progress Notes (Signed)
Patient ID: [id number], male   DOB: 12/23/1990, 21 y.o.   MRN: [id number] Right arm wound with good granulation tissue.  Sutures out today, median nerve deficit, this should resolve with time.

## 2012-08-19 NOTE — Progress Notes (Signed)
Attempted to remove sutures on patient's right upper arm but patient refused.Patient does not want to be bothered at this time and becomes argumentative.Patient's Dad in the room.Will try again later. Elliotte Marsalis Joselita,RN

## 2012-08-20 LAB — CBC
Hemoglobin: 10.1 g/dL — ABNORMAL LOW (ref 13.0–17.0)
Platelets: 638 10*3/uL — ABNORMAL HIGH (ref 150–400)
RBC: 3.48 MIL/uL — ABNORMAL LOW (ref 4.22–5.81)
WBC: 11.3 10*3/uL — ABNORMAL HIGH (ref 4.0–10.5)

## 2012-08-20 LAB — RENAL FUNCTION PANEL
Albumin: 3.9 g/dL (ref 3.5–5.2)
CO2: 26 mEq/L (ref 19–32)
Chloride: 95 mEq/L — ABNORMAL LOW (ref 96–112)
Creatinine, Ser: 2.75 mg/dL — ABNORMAL HIGH (ref 0.50–1.35)
GFR calc Af Amer: 36 mL/min — ABNORMAL LOW (ref >90)
GFR calc non Af Amer: 31 mL/min — ABNORMAL LOW (ref >90)
Potassium: 4.5 mEq/L (ref 3.5–5.1)
Sodium: 137 mEq/L (ref 135–145)

## 2012-08-20 LAB — CULTURE, BLOOD (ROUTINE X 2): Culture: NO GROWTH

## 2012-08-20 MED ORDER — CLONIDINE HCL 0.2 MG PO TABS: 0.2000 mg | ORAL_TABLET | Freq: Three times a day (TID) | ORAL | Status: DC

## 2012-08-20 MED ORDER — OXYCODONE HCL 5 MG PO TABS: 5.0000 mg | ORAL_TABLET | Freq: Four times a day (QID) | ORAL | Status: DC | PRN

## 2012-08-20 MED ORDER — THIAMINE HCL 100 MG PO TABS: 100.0000 mg | ORAL_TABLET | Freq: Every day | ORAL | Status: DC

## 2012-08-20 MED ORDER — DIVALPROEX SODIUM 125 MG PO CPSP: 250.0000 mg | ORAL_CAPSULE | Freq: Two times a day (BID) | ORAL | Status: DC

## 2012-08-20 MED ORDER — UNABLE TO FIND: Status: DC

## 2012-08-20 MED ORDER — SILVER SULFADIAZINE 1 % EX CREA: TOPICAL_CREAM | Freq: Every day | CUTANEOUS | Status: DC

## 2012-08-20 NOTE — Discharge Summary (Signed)
Pt discharged to home with mother. Iv d/c'd, belongings with pt., d/c summary completed and given to pt.  Prescriptions given to mom. Pt and mother verbalize understanding of all instructions. Instructed on care for ar, verbalized understanding and teach back.

## 2012-08-20 NOTE — Progress Notes (Signed)
Patient, Martin Chapman, will be discharging to father's home [facility]) today.  Patient's family (father, mother and sister) have been involved in process of patient's progress and discharge opportunities. [facility] currently has patient's paperwork (faxed 08/17/12) and all contact numbers to reach patient should he be accepted for treatment.  CSW spoke with [facility] and learned they have no male beds today, do expect openings, and will contact the family as to acceptance/denial.  [facility] also informed that patient will now be a voluntary verses involuntary admission. Patient discharging to father's home today.  Patient also given  contact numbers for [facility] in area with openings and [employer] along with a bus pass should he need transportation to [employer] interview at a later date.  Patient also given business card for [facility] along contact information for [facility] and [facility] all of whom offer Substance Abuse Outpatient Services. Psych clinical social worker signing off.  Carney Bern, [place] Clinical Social Worker [phone] 08/20/2012 2:46 PM

## 2012-08-20 NOTE — Progress Notes (Signed)
Sutures removed on anterior and posterior upper arm.No bleeding noted.Patient tolerated well. Carold Eisner Joselita,RN

## 2012-08-20 NOTE — Progress Notes (Signed)
Nutrition Follow-up  Intervention:    Recommend maximum encouragement given mental status  RD to continue to follow nutrition care plan  Assessment:   Last HD on 11/11. HD catheter removed 11/16. Pt planning to d/c home with father per chart review.  Intake of meals varies from 0 - 50%. Pt often with meal refusals 2/2 mental status. Followed by psych.  Phosphorus and potassium currently WNL.  Diet Order: Regular diet Supplements: none  Meds: Scheduled Meds:    . cloNIDine  0.2 mg Oral TID  . darbepoetin (ARANESP) injection - DIALYSIS  150 mcg Intravenous Q Mon-HD  . divalproex  250 mg Oral Q12H  . famotidine  20 mg Oral Daily  . LORazepam  2 mg Oral BID  . methadone  2.5 mg Oral Q12H  . multivitamin  1 tablet Oral QHS  . saccharomyces boulardii  250 mg Oral BID  . silver sulfADIAZINE   Topical Daily  . thiamine  100 mg Oral Daily   Continuous Infusions:   PRN Meds:.acetaminophen (TYLENOL) oral liquid 160 mg/5 mL, haloperidol lactate, LORazepam, ondansetron, oxyCODONE  Labs:  CMP     Component Value Date/Time   NA 137 08/20/2012 0450   K 4.5 08/20/2012 0450   CL 95* 08/20/2012 0450   CO2 26 08/20/2012 0450   GLUCOSE 92 08/20/2012 0450   BUN 37* 08/20/2012 0450   CREATININE 2.75* 08/20/2012 0450   CALCIUM 9.8 08/20/2012 0450   PROT 7.7 08/11/2012 0645   ALBUMIN 3.9 08/20/2012 0450   AST 41* 08/11/2012 0645   ALT 18 08/11/2012 0645   ALKPHOS 99 08/11/2012 0645   BILITOT 0.4 08/11/2012 0645   GFRNONAA 31* 08/20/2012 0450   GFRAA 36* 08/20/2012 0450    Sodium  Date/Time Value Range Status  08/20/2012  4:50 AM 137  135 - 145 mEq/L Final  08/19/2012  5:35 AM 138  135 - 145 mEq/L Final  08/18/2012  4:30 AM 135  135 - 145 mEq/L Final    Potassium  Date/Time Value Range Status  08/20/2012  4:50 AM 4.5  3.5 - 5.1 mEq/L Final  08/19/2012  5:35 AM 4.6  3.5 - 5.1 mEq/L Final  08/18/2012  4:30 AM 4.2  3.5 - 5.1 mEq/L Final    Phosphorus  Date/Time Value Range  Status  08/20/2012  4:50 AM 4.1  2.3 - 4.6 mg/dL Final  16/07/9603  5:40 AM 4.7* 2.3 - 4.6 mg/dL Final  98/08/9146  8:29 AM 4.6  2.3 - 4.6 mg/dL Final    Magnesium  Date/Time Value Range Status  07/31/2012  5:00 AM 2.3  1.5 - 2.5 mg/dL Final  56/21/3086  5:78 AM 2.5  1.5 - 2.5 mg/dL Final  46/96/2952  8:41 AM 2.7* 1.5 - 2.5 mg/dL Final    CBG (last 3)  No results found for this basename: GLUCAP:3 in the last 72 hours   Intake/Output Summary (Last 24 hours) at 08/20/12 1454 Last data filed at 08/20/12 0900  Gross per 24 hour  Intake   1440 ml  Output      1 ml  Net   1439 ml  BM: 11/17  Weight Status:  61.3 kg/135 lb - overall trending down 2/2 fluid status and remote hx of HD  Estimated needs:  1800 - 2000 kcals, 60 - 70  gm protein, 1.8 - 2 liters fluid daily  Nutrition Dx:  Inadequate oral intake now r/t variable attention AEB variable meal completion.  Goal:  Intake to meet >90% of  estimated nutrition needs, unmet.  Monitor:  PO intake, weight trend, labs, I/O, need for education  Jarold Motto MS, RD, LDN Pager: [phone] After-hours pager: [phone]

## 2012-08-20 NOTE — Progress Notes (Addendum)
Occupational Therapy Treatment Patient Details Name: Martin Chapman MRN: [id number] DOB: Feb 22, 1991 Today's Date: 08/20/2012 Time: 4098-1191 OT Time Calculation (min): 16 min  OT Assessment / Plan / Recommendation Comments on Treatment Session Self limiting and manipulative behavior throughout session. INcreased participation with passive stretching today. Will need to continue with outpt OTrehab at next venue.    Follow Up Recommendations  Other (comment) (outpt OT) Neuro Outpt; [phone]; [address]    Barriers to Discharge       Equipment Recommendations    none   Recommendations for Other Services Other (comment)  Frequency Min 3X/week   Plan Discharge plan needs to be updated    Precautions / Restrictions Precautions Precautions: None   Pertinent Vitals/Pain Constant c/o pain R UE. Premedicated.    ADL  ADL Comments: Focus on gentle passive stretching RUE in elbow extension and shoulder flexion/extension., supination. Completing theraputty exercises with min A for correct exercises.     OT Diagnosis:    OT Problem List:   OT Treatment Interventions:     OT Goals Acute Rehab OT Goals OT Goal Formulation: Patient unable to participate in goal setting Time For Goal Achievement: 08/21/12 Potential to Achieve Goals: Good ADL Goals Pt Will Perform Grooming: with min assist;Standing at sink ADL Goal: Grooming - Progress: Met Pt Will Perform Upper Body Bathing: with set-up;Sitting, chair;Sitting, edge of bed ADL Goal: Upper Body Bathing - Progress: Met Pt Will Perform Lower Body Bathing: with supervision;Sit to stand from chair;Sit to stand from bed ADL Goal: Lower Body Bathing - Progress: Met Pt Will Perform Lower Body Dressing: with supervision;Sit to stand from chair;Sit to stand from bed ADL Goal: Lower Body Dressing - Progress: Met Pt Will Transfer to Toilet: with supervision;Ambulation;with DME ADL Goal: Toilet Transfer - Progress: Met Pt Will Perform  Toileting - Clothing Manipulation: with supervision;Standing ADL Goal: Toileting - Clothing Manipulation - Progress: Met Additional ADL Goal #1: Pt. will demonstrate full AROM Rt. UE as needed for ADLs. ADL Goal: Additional Goal #1 - Progress: Progressing toward goals Additional ADL Goal #2: Pt will demonstrate sustained attention during ADL task with min vc for redirection in nondistracting enviromnment. ADL Goal: Additional Goal #2 - Progress: Met Miscellaneous OT Goals Miscellaneous OT Goal #1: Pt will demonstrate emergent awareness during functional ADL task with min vc. OT Goal: Miscellaneous Goal #1 - Progress: Met  Visit Information  Last OT Received On: 08/20/12    Subjective Data      Prior Functioning       Cognition  Overall Cognitive Status: Impaired Area of Impairment: Attention;Awareness of errors;Awareness of deficits;Executive functioning Arousal/Alertness: Awake/alert Orientation Level: Appears intact for tasks assessed Behavior During Session: Agitated Current Attention Level: Sustained Attention - Other Comments: distracted by pain and wanting bed made up. Memory: Decreased recall of precautions Following Commands: Follows one step commands consistently Safety/Judgement: Impulsive Safety/Judgement - Other Comments: walking down hall yelling for someone to make his bed Awareness of Errors: Assistance required to correct errors made Awareness of Errors - Other Comments: poor anticipatory awareness Awareness of Deficits: Poor safety awareness overall with poor awareness of deficits (unaware of balance deficit with ambulation losing balance needing mod assist with no attempts to correct balance on own.) Problem Solving: Min A Executive Functioning: poor reasoning and self control Cognition - Other Comments: inappropriate behaviors at times and decresed reasoning noted during session    Mobility  Shoulder Instructions Transfers Transfers: Sit to Stand;Stand to  Sit Sit to Stand: 7: Independent  Stand to Sit: 7: Independent       Exercises  General Exercises - Upper Extremity Shoulder Flexion: Right;AROM;Other (comment) (gentle sustained stretch 2 reps) Shoulder Extension: AROM;Right;Other (comment) (sustained stretch x1) Elbow Extension: Right;AROM;Other (comment) (sustained stretch x2) Wrist Extension: AROM;Right;Other (comment) (sustained stretch x 1) Digit Composite Flexion: AROM;Right;5 reps;Other (comment) (theraputty) Composite Extension: AROM;Right;5 reps;Other (comment) (theraputty. will not bear weight through hand) Hand Exercises Forearm Supination: AROM;Right;Other (comment) (2 reps) Forearm Pronation: AROM;Right;Other (comment) (2 reps) Other Exercises Other Exercises: self limiting performance   Balance  WFL   End of Session OT - End of Session Activity Tolerance: Other (comment) (self limiting) Patient left: Other (comment) (waling around room) Nurse Communication: Other (comment) (pt premedicated prior to session)  GO     Aphrodite Harpenau,HILLARY 08/20/2012, 2:56 PM [facility], OTR/L  [phone] 11/18/2013Hilary Greene Diodato, OTR/L  [phone] 08/20/2012

## 2012-08-20 NOTE — Discharge Summary (Signed)
Physician Discharge Summary  DRAEGAN CARRARA GEX:[id number] DOB: 07/28/1991 DOA: 07/22/2012  PCP: No primary provider on file.  Admit date: 07/22/2012 Discharge date: 08/20/2012  Recommendations for Outpatient Follow-up:  1. Pt will need to follow up with PCP in 2 weeks post discharge 2. Follow up with Dr. Aldean Baker in 1-2 weeks 3. Patient was provided contact information for outpatient occupational therapy and a prescription was provided  Discharge Diagnoses:  1. Acute hypoxic respiratory failure:  -Secondary to heroin overdose and alcohol ingestion  -Likely a component of aspiration pneumonitis  -Clinically stable on room air at this time  2. Hypovolemic/septic shock: -lactic acidosis, hypotension, and rhabdomyolysis have improved  -Remains hemodynamically stable  3. Rhabdomyolysis/Acute renal failure:  -Rhabdomyolysis improving with intravenous fluids  -Renal function continues to improve off of dialysis--serum creatinine 2.75 on the day of discharge -Last dialysis 08/13/2012  -Dialysis catheter removed 08/18/2012  4. Elevated LFT's and INR: This was likely due to shock liver. Resolved as of 08/11/12.  5. C. Difficile infection:  -Treated with 14 days of oral vancomycin  -Resolved--repeat C. difficile PCR was negative on November 18 6. Hyperglycemia:  -Resolved  7. Acute encephalopathy: This was toxic/metabolic. There may also be an element of anoxic brain damage. EEG revealed findings consistent with mild encephalopathy, and brain MRI brain was negative for acute pathology. B12, Thiamine, ammonia, TSH, all within normal limits.HIV test and RPR were negative. Mental status is close to new baseline at this time, although patient occasionally appears disoriented and forgetful. He has had some periods of severe agitation on 08/13/12, requiring 1:1 Sitter and restraints.  -Lumbar puncture was negative for infectious etiology  -Original Psychiatrist consultation seen on 08/14/2012  by Dr.Jonnalagadda  --> Recommended Haldol and Ativan when necessary agitation  -Will need referrals to outpatient alcoholic and Narcotics Anonymous  -Psychiatry has declared the patient having full capacity to make his own medical decisions  -Psychiatry states patient doesn't meet criteria for substance detox tx because he is already medically detoxed, but he will required long-term rehabilitation services. Patient has willingness to go to [place].  8. Right upper extremity injury with compartment syndrome and possible vascular injury: Dr Aldean Baker was consulted for management, and performed surgery with shoulder release and fasciotomy of right arm on 07/22/12, followed by excision of biceps muscle right arm, neurolysis median nerve, secondary closure of triceps and biceps incision, with application of wound vac on 07/24/12. Arch aortogram in OR normal. No vascular deficit in RUE/shoulder. Managing per Dr. Lajoyce Corners and wound care. Wound vac has since been discontinued  -Right upper extremity wound with great granulation tissue--no lymphangitis, necrosis or other signs of infection  -Sutures removed 08/19/2012.--Continue wound care with Silvadene and daily wound care changes 9. Oral thrush:  -Improved after 7 days of fluconazole 10. HTN: Patient's BP has been persistently elevated, since resolution of shock, in spite of hemodialysis. Adjusting Clonidine as indicated, with improvement.  11. Pyrexia:  -No fevers in the last 5 days -Discontinued all antibiotics and observe  12. Metabolic encephalopathy  -Speech therapy evaluation for cognitive linguistic assessment revealedHe demonstrated adequate focused and sustained attention in a quiet, non-distracting environment; verbal recall of sequential words and problem-solving situations was normal after 2", 5", then 10" intervals. Problem-solving for hypothetical situations was functional - responses were vague but appropriate. Mental flexibility for  complex reasoning tasks was more difficult, with pt showing poor persistance. Verbal impulsivity noted. Uses/understands basic humor. Pt's symptoms appear less attributable to hypoxia than to  behavioral/polysubstance hx and residual encephalopathic component, but will defer to psychiatry for diagnosis. No formal SLP intervention warranted on acute level  -MRI brain negative for signs of hypoxemic encephalopathy--> shows premature cerebellar and cerebral atrophy 13. Polysubstance abuse -Many attempts for placement of substance abuse outpatient rehabilitation -Social worker Ronda Fairly  States "[facility] currently has patient's paperwork (faxed 08/17/12) and all contact numbers to reach patient should he be accepted for treatment. CSW spoke with [facility] and learned they have no male beds today, do expect openings, and will contact the family as to acceptance/denial. [facility] also informed that patient will now be a voluntary rather than involuntary admission.  Patient discharging to father's home today. Patient also given contact numbers for [facility] in area with openings and [employer] along with a bus pass should he need transportation to [employer] interview at a later date. Patient also given business card for [facility] along contact information for [facility] and [facility] all of whom offer Substance Abuse Outpatient Services. -I was also informed by the social worker that the patient would not likely qualify for [facility] due to his insurance and due to the fact that the patient currently remains on opioid therapy for his arm pain from surgery -Multiple discussions with the patient's mother and father today regarding his disposition. It was agreed that the patient will not be discharged on a methadone at this time as he has been medically detoxed. Father has agreed to fill the patient's prescription of oxycodone and to  dispense it to the patient instead of the lung the patient to self dispense any medications. -Prescriptions were also provided for clonidine and Depakote sprinkles    Discharge Condition: Stable  Disposition:  Follow-up Information    Follow up with DUDA,MARCUS V, MD. On 09/10/2012. (1:00pm 09/10/2012)    Contact information:   [address] [place] [address] [phone]          Diet:regular Wt Readings from Last 3 Encounters:  08/18/12 61.326 kg (135 lb 3.2 oz)  08/18/12 61.326 kg (135 lb 3.2 oz)  08/18/12 61.326 kg (135 lb 3.2 oz)    Hospital Course:  The patient was started on CVVHD initially until 07/24/2012. He was transitioned to intermittent hemodialysis. A PermCath was initially placed on 07/31/2012. In addition he was noted to be hypotensive and hypovolemic. The patient was fluid resuscitated and started on vasopressors. He developed acute respiratory failure and had to be intubated. There was concern for aspiration pneumonitis. The cultures and urine cultures were obtained. The patient was started on broad-spectrum antibiotics in the form of vancomycin and Zosyn.. All of the patient's blood cultures were negative throughout the hospitalization. The patient's right arm subsequently required debridement due to compartment syndrome. This was performed by Dr. Aldean Baker on 07/24/2012. Arch aortogram in the operating room was normal. During the operation, the patient had excision of his biceps, median nerve neurolysis. The patient initially had a wound VAC. This was subsequently discontinued and the patient was placed on Silvadene dressings. His wound continued to improve with local care and wound dressings. His sutures were removed on 08/19/2012. The patient was encouraged to continue to work with occupational therapy for his arm mobility and median nerve deficit. He would intermittently refuse therapy. The patient needs to continue changing his wound care dressings once  daily after washing his wound with soap and water, then he is to apply Silvadene and wrapped with gauze.  He was subsequently extubated on 07/29/2012. The patient's CPK gradually trended down after the surgery as well as with dialysis support. Echocardiogram on 07/24/2001 revealed an ejection fraction 55-60% with no wall motion abnormalities. The patient's Zosyn was continued from the day of admission until 07/30/2012.Marland Kitchen His intravenous vancomycin was continued from October 20 until October 26. The patient developed diarrhea. His C. difficile PCR was positive. The patient was treated with oral vancomycin for 14 days with resolution. After the patient was extubated, the patient remained significantly agitated. The patient was started on Precedex as well as Ativan, Haldol and Seroquel. The patient was also started on methadone, and this was gradually weaned to a maintenance dose of 2.5 mg twice a day. Ultimately, a one-to-one sitter was instituted for agitation and suicide precautions. EEG was performed on 08/03/2012 and it showed mild nonspecific generalized cerebral dysfunction without any seizure activity. MRI of the brain was obtained 08/03/2012, and it did not show any focal lesions or acute infarcts. There was no sign of hypoxemic or ischemic encephalopathy. There was premature cerebellar and cerebral atrophy. Neurology was consulted to see the patient. Lumbar puncture was performed on 08/05/2012. The cell count did not suggest an infectious process and bacterial cultures were all negative. PCR for herpes simplex virus in the CSF was negative. The patient's agitation gradually improved. The patient was started on Depakote for his agitation as well as clonidine for potential withdrawal. He was subsequently moved out of the ICU on 08/09/2012. Workup for his encephalopathy including HIV, RPR, B12, thiamine, ammonia, and TSH were all negative. On the medical floor, the patient did have intermittent episodes of  agitation. This gradually improved. Speech therapy evaluation for cognitive linguistic assessment revealed "he demonstrated adequate focused and sustained attention in a quiet, non-distracting environment; verbal recall of sequential words and problem-solving situations was normal after 2", 5", then 10" intervals. Problem-solving for hypothetical situations was functional - responses were vague but appropriate. Mental flexibility for complex reasoning tasks was more difficult, with pt showing poor persistance. Verbal impulsivity noted. Uses/understands basic humor. Pt's symptoms appear less attributable to hypoxia than to behavioral/polysubstance hx and residual encephalopathic component, but will defer to psychiatry for diagnosis. No formal SLP intervention warranted on acute level".  . His sitter was discontinued. Wound care was continued after the wound VAC was discontinued. The patient developed thrush and was treated with one week of fluconazole. The patient's dialysis catheter was removed on 08/18/2012. His renal function continued to improve. Nephrology service signed off.   Consultants: Nephrology Critical care Psychiatry Neurology Orthopedics  Discharge Exam: Filed Vitals:   08/20/12 1001  BP: 124/84  Pulse: 85  Temp: 98.4 F (36.9 C)  Resp: 20   Filed Vitals:   08/19/12 1755 08/19/12 2126 08/20/12 0419 08/20/12 1001  BP: 112/74 120/78 118/75 124/84  Pulse: 75 70 70 85  Temp: 98.4 F (36.9 C) 98.3 F (36.8 C) 98.2 F (36.8 C) 98.4 F (36.9 C)  TempSrc: Oral Oral Oral Oral  Resp: 14 17 18 20   Height:      Weight:      SpO2: 96% 99% 97% 98%   General: A&O x 3, NAD, pleasant, cooperative Cardiovascular: RRR, no rub, no gallop, no S3 Respiratory: CTAB, no wheeze, no rhonchi Abdomen:soft, nontender, nondistended, positive bowel sounds Extremities: No edema, No lymphangitis, no petechiae  Discharge Instructions  Discharge Orders    Future Orders Please Complete By  Expires   Diet - low sodium heart healthy  Increase activity slowly      Discharge instructions      Comments:   Call [facility] to schedule therapy for right arm--[phone] Clean right arm with soap and water once daily and apply Silvadene ointment and wrap with gauze once daily Call and schedule follow up appointment with Dr. Aldean Baker in 1 week-[phone]       Medication List     As of 08/20/2012  7:48 PM    TAKE these medications         cloNIDine 0.2 MG tablet   Commonly known as: CATAPRES   Take 1 tablet (0.2 mg total) by mouth 3 (three) times daily.      divalproex 125 MG capsule   Commonly known as: DEPAKOTE SPRINKLE   Take 2 capsules (250 mg total) by mouth 2 (two) times daily.      oxyCODONE 5 MG immediate release tablet   Commonly known as: Oxy IR/ROXICODONE   Take 1 tablet (5 mg total) by mouth every 6 (six) hours as needed for pain.      silver sulfADIAZINE 1 % cream   Commonly known as: SILVADENE   Apply topically daily. Apply to right arm wound daily with dressing once daily after cleansing with soap and water      thiamine 100 MG tablet   Take 1 tablet (100 mg total) by mouth daily.      UNABLE TO FIND   Occupational and Physical Therapy to evaluate and treat         The results of significant diagnostics from this hospitalization (including imaging, microbiology, ancillary and laboratory) are listed below for reference.    Significant Diagnostic Studies: [name] Contrast  08/04/2012  *RADIOLOGY REPORT*  Clinical Data: Polysubstance abuse.  Altered mental status.  Renal failure.  MRI HEAD WITHOUT CONTRAST  Technique:  Multiplanar, multiecho pulse sequences of the brain and surrounding structures were obtained according to standard protocol without intravenous contrast.  Comparison: CT head 04/08/2012 (normal)  Findings: The patient had difficulty remaining motionless for the study.  Images are  suboptimal.  Small or subtle lesions could be overlooked.  There is no evidence for acute infarction, intracranial hemorrhage, mass lesion, hydrocephalus, or extra-axial fluid.  There may be mild premature cerebral and cerebellar atrophy for age.  There is no visible white matter disease.  No evidence for hypoxic ischemic encephalopathy.  No basal ganglia T1 shortening to suggest early hepatic encephalopathy.  Normal pituitary and cerebellar tonsils. Major intracranial flow voids are preserved.  Chronic sinus disease right division sphenoid sinus.  Slight dependent mastoid fluid bilaterally.  Because of renal failure, Gadolinium contrast could not be administered.  Meningeal enhancement or early changes of meningoencephalitis could be inapparent on this noncontrast examination.  IMPRESSION: Except for mild premature cerebral and cerebellar atrophy, there is no visible acute intracranial abnormality.  No signs of hypoxic ischemic encephalopathy or acute/subacute infarction.  No foci of chronic hemorrhage.  No evidence for hepatic encephalopathy metabolic changes.   Original Report Authenticated By: Davonna Belling, M.D.    US Renal  07/23/2012  *RADIOLOGY REPORT*  Clinical Data: Acute renal failure  RENAL/URINARY TRACT ULTRASOUND COMPLETE  Comparison:  CT abdomen pelvis - 04/08/2012; chest radiograph - 07/23/2012  Findings:  Right Kidney:  Normal cortical thickness, echogenicity and size, measuring 13.2 cm in length.  There is a minimal amount of perinephric fluid.  No focal renal lesions.  No echogenic renal stones.  No urinary obstruction.  Left Kidney:  Normal cortical thickness, echogenicity and size, measuring 13.8 cm in length. There is a small amount of perirenal fluid.  No focal renal lesions.  No echogenic renal stones.  No urinary obstruction.  Bladder:  Decompressed with a Foley catheter  Small amount of ascites is noted within the pelvis.  There is a minimal amount of perisplenic fluid.  Small left-sided  pleural effusion is incidentally noted  IMPRESSION: 1.  No sonographic explanation for patient's acute renal failure. Specifically, no evidence of urinary obstruction. 2.  Findings suggestive of volume overload/third spacing including a minimal amount of ascites, small left-sided pleural effusion and small amount of perirenal fluid, nonspecific but possibly attributable to reported history of acute renal failure.   Original Report Authenticated By: Waynard Reeds, M.D.    Dg Chest Port 1 View  08/14/2012  *RADIOLOGY REPORT*  Clinical Data: Fever  PORTABLE CHEST - 1 VIEW  Comparison: August 08, 2012  Findings:  Central catheter tip is at the junction of the superior vena cava and right atrium.  No pneumothorax.  The lungs are clear. The heart size and pulmonary vascularity are normal.  No adenopathy.  There is an old healed fracture of the left clavicle.  IMPRESSION: Lungs clear. Central catheter position unchanged.  No pneumothorax.   Original Report Authenticated By: Bretta Bang, M.D.    Dg Chest Port 1 View  08/08/2012  *RADIOLOGY REPORT*  Clinical Data: Fever  PORTABLE CHEST - 1 VIEW  Comparison: 07/31/2012  Findings: Left large bore catheter tips project over the mid/distal SVC.  Heart size upper normal to mildly enlarged.  Mild central vascular congestion. Mild peribronchial thickening. The above findings may be accentuated by hypoaeration. No confluent airspace opacity.  No pleural effusion or pneumothorax.  No acute osseous finding.  IMPRESSION: Mild peribronchial thickening is nonspecific, can be seen with bronchitis or mild edema.  No confluent airspace opacity.   Original Report Authenticated By: Jearld Lesch, M.D.    Dg Chest Port 1 View  07/31/2012  *RADIOLOGY REPORT*  Clinical Data: Evaluate for pneumothorax.  Insertion of dialysis catheter.  PORTABLE CHEST - 1 VIEW  Comparison: Chest x-ray 07/29/2012.  Findings: There is a new left internal jugular PermCath and position with the  tips terminating in the right atrium and at the superior cavoatrial junction. There is a left-sided internal jugular central venous catheter with tip terminating in the distal left innominate vein.  Nasogastric tube has been withdrawn, now with tip near the gastroesophageal junction.  Lung volumes are low. No pneumothorax.  No acute consolidative airspace disease. Crowding of the pulmonary vasculature, accentuated by low lung volumes, without frank pulmonary edema.  No pleural effusions. Heart size is normal.  Mediastinal contours are unremarkable.  IMPRESSION: 1.  New left IJ PermCath appears properly located. 2.  No definite pneumothorax following dialysis catheter placement. 3.  Tip of nasogastric tube is now at the gastroesophageal junction and could be advanced at least 8 cm for more optimal placement. Additional support apparatus, as above.   Original Report Authenticated By: Florencia Reasons, M.D.    Dg Chest Port 1 View  07/29/2012  *RADIOLOGY REPORT*  Clinical Data: Atelectasis.  PORTABLE CHEST - 1 VIEW  Comparison: 07/28/2012.  Findings: Endotracheal tube tip 1.9 cm above the carina.  Left central line tip mid superior vena cava level.  Nasogastric tube side hole just beyond the gastroesophageal junction.  No gross pneumothorax.  Mild central pulmonary vascular prominence.  No segmental  consolidation.  Oblique subsegmental atelectasis right midlung.  IMPRESSION: No significant change in the subsegmental atelectasis right midlung.   Original Report Authenticated By: Fuller Canada, M.D.    Dg Chest Port 1 View  07/28/2012  *RADIOLOGY REPORT*  Clinical Data: Drug overdose, intubated  PORTABLE CHEST - 1 VIEW  Comparison:   the previous day's study  Findings: Endotracheal tube, nasogastric tube, and left IJ central line are stable in position.  Some improvement in the perihilar edema.  Linear atelectasis or early infiltrate in the right lower lung has not significantly changed.  No effusion.  Heart  size normal.  IMPRESSION:  1.  Improved   perihilar opacities. 2. Support hardware stable in position.   Original Report Authenticated By: Osa Craver, M.D.    Dg Chest Port 1 View  07/27/2012  *RADIOLOGY REPORT*  Clinical Data: Evaluate endotracheal tube.  PORTABLE CHEST - 1 VIEW  Comparison: 07/26/2012  Findings: Endotracheal tube is 4.2 cm above the carina.  Central line tip in the SVC region.  Improved aeration at the left lung base.  No focal airspace disease.  Heart size is normal. Nasogastric tube in the stomach body region.  IMPRESSION: Improved aeration at the left lung base.  Support apparatuses as described.   Original Report Authenticated By: Richarda Overlie, M.D.    Dg Chest Port 1 View  07/26/2012  *RADIOLOGY REPORT*  Clinical Data: Atelectasis.  Shortness of breath.  PORTABLE CHEST - 1 VIEW  Comparison: Chest x-ray 07/25/2012.  Findings: An endotracheal tube is in place with tip 3.2 cm above the carina. There is a left-sided internal jugular central venous catheter with tip terminating in the superior cavoatrial junction. Lung volumes are low.  Bibasilar opacities (left greater than right), favored to represent areas of subsegmental atelectasis. Blunting of the left costophrenic sulcus favored to reflect a small left pleural effusion.  Crowding of the pulmonary vasculature, accentuated by low lung volumes.  Heart size is within normal limits. The patient is rotated to the right on today's exam, resulting in distortion of the mediastinal contours and reduced diagnostic sensitivity and specificity for mediastinal pathology.  IMPRESSION: 1.  Support apparatus, as above. 2.  Low lung volumes with worsening bibasilar atelectasis and new small left pleural effusion.   Original Report Authenticated By: Florencia Reasons, M.D.    Dg Chest Port 1 View  07/25/2012  *RADIOLOGY REPORT*  Clinical Data: Intubated patient  PORTABLE CHEST - 1 VIEW  Comparison: Chest radiograph 07/24/2012  Findings:  Endotracheal tube is 2.2 cm from carina.  Left-sided central venous line is unchanged in position.  Stable cardiac silhouette.  Lungs are clear.  No pneumothorax.  IMPRESSION: Endotracheal tube 2.2 cm from carina.   Original Report Authenticated By: Genevive Bi, M.D.    Dg Chest Port 1 View  07/24/2012  *RADIOLOGY REPORT*  Clinical Data: Evaluate endotracheal tube position  PORTABLE CHEST - 1 VIEW  Comparison: Chest radiograph 07/23/2012 at 1:08 hours  Findings: Current radiograph is at 5:47 hours.  The patient is markedly rotated to the right on the first image.  A second image was performed, without significant rotation. The endotracheal tube is approximately 4 cm above the carina, and at the level of the clavicular heads.  Left IJ central venous catheter terminates in the distal superior vena cava.  Nasogastric tube courses into the stomach and continues below the edge of the image.  Lung volumes are low.  Heart size is normal. There are bilateral hazy  opacities at the lung bases and costophrenic angles, consistent with small bilateral pleural effusions. Aeration of the right upper lung field is improved compared to the chest radiograph performed 07/23/2012.  IMPRESSION: Satisfactory position of endotracheal tube.  Small bilateral pleural effusions and low lung volumes. Improved aeration of the right upper lung field compared to the 07/23/2012 chest radiograph.   Original Report Authenticated By: Britta Mccreedy, M.D.    Dg Chest Port 1 View  07/23/2012  *RADIOLOGY REPORT*  Clinical Data: Respiratory failure and overdose.  PORTABLE CHEST - 1 VIEW  Comparison: 07/22/2012  Findings: Since the prior study, the patient has been intubated with endotracheal tube tip approximately 2 cm above the carina. Left jugular central line tip lies in the lower SVC.  A nasogastric tube extends into the proximal stomach.  Lung volumes are low with interval development of mild pulmonary edema and potentially additional  infiltrate in the right lung.  No significant pleural effusions are identified.  Heart size is within normal limits.  IMPRESSION: Development of mild pulmonary edema and potentially also infiltrate of the right lung.   Original Report Authenticated By: Reola Calkins, M.D.    Dg Chest Portable 1 View  07/22/2012  *RADIOLOGY REPORT*  Clinical Data: Drug overdose.  PORTABLE CHEST - 1 VIEW  Comparison: 04/08/2012  Findings: Heart size and pulmonary vascularity are normal and the lungs are clear.  No osseous abnormality.  IMPRESSION: Normal chest.   Original Report Authenticated By: Gwynn Burly, M.D.    Dg Shoulder Right Port  07/22/2012  *RADIOLOGY REPORT*  Clinical Data: Right shoulder pain.  PORTABLE RIGHT SHOULDER - 2+ VIEW  Comparison: None.  Findings: No fracture, dislocation, or other abnormality.  IMPRESSION: Normal exam.   Original Report Authenticated By: Gwynn Burly, M.D.      Microbiology: Recent Results (from the past 240 hour(s))  CULTURE, BLOOD (ROUTINE X 2)     Status: Normal   Collection Time   08/14/12 10:05 AM      Component Value Range Status Comment   Specimen Description BLOOD RIGHT ANTECUBITAL   Final    Special Requests BOTTLES DRAWN AEROBIC ONLY 3CC   Final    Culture  Setup Time 08/14/2012 16:51   Final    Culture NO GROWTH 5 DAYS   Final    Report Status 08/20/2012 FINAL   Final   CULTURE, BLOOD (ROUTINE X 2)     Status: Normal   Collection Time   08/14/12 10:15 AM      Component Value Range Status Comment   Specimen Description BLOOD RIGHT HAND   Final    Special Requests BOTTLES DRAWN AEROBIC ONLY 8CC   Final    Culture  Setup Time 08/14/2012 16:51   Final    Culture NO GROWTH 5 DAYS   Final    Report Status 08/20/2012 FINAL   Final   URINE CULTURE     Status: Normal   Collection Time   08/14/12  9:29 PM      Component Value Range Status Comment   Specimen Description URINE, RANDOM   Final    Special Requests NONE   Final    Culture  Setup  Time 08/14/2012 23:01   Final    Colony Count NO GROWTH   Final    Culture NO GROWTH   Final    Report Status 08/16/2012 FINAL   Final   CLOSTRIDIUM DIFFICILE BY PCR     Status: Normal   Collection  Time   08/20/12  3:31 AM      Component Value Range Status Comment   C difficile by pcr NEGATIVE  NEGATIVE Final      Labs: Basic Metabolic Panel:  Lab 08/20/12 4540 08/19/12 0535 08/18/12 0430 08/17/12 0620 08/16/12 0545  NA 137 138 135 134* 133*  K 4.5 4.6 -- -- --  CL 95* 97 95* 93* 91*  CO2 26 30 27 26 27   GLUCOSE 92 89 110* 103* 96  BUN 37* 38* 39* 40* 38*  CREATININE 2.75* 3.03* 3.26* 3.32* 3.75*  CALCIUM 9.8 9.7 9.6 9.3 9.3  MG -- -- -- -- --  PHOS 4.1 4.7* 4.6 5.2* 5.3*   Liver Function Tests:  Lab 08/20/12 0450 08/19/12 0535 08/18/12 0430 08/17/12 0620 08/16/12 0545  AST -- -- -- -- --  ALT -- -- -- -- --  ALKPHOS -- -- -- -- --  BILITOT -- -- -- -- --  PROT -- -- -- -- --  ALBUMIN 3.9 3.6 3.6 3.4* 3.4*   No results found for this basename: LIPASE:5,AMYLASE:5 in the last 168 hours No results found for this basename: AMMONIA:5 in the last 168 hours CBC:  Lab 08/20/12 0450 08/18/12 0430 08/16/12 0545 08/15/12 0550 08/14/12 0605  WBC 11.3* 9.6 10.6* 10.9* 12.1*  NEUTROABS -- -- 6.7 -- --  HGB 10.1* 9.2* 8.6* 8.4* 8.5*  HCT 31.2* 27.2* 25.9* 25.3* 24.8*  MCV 89.7 88.6 86.6 85.2 87.0  PLT 638* 536* 515* 495* 531*   Cardiac Enzymes: No results found for this basename: CKTOTAL:5,CKMB:5,CKMBINDEX:5,TROPONINI:5 in the last 168 hours BNP: No components found with this basename: POCBNP:5 CBG: No results found for this basename: GLUCAP:5 in the last 168 hours  Time coordinating discharge:  Greater than 30 minutes  Signed:  Khadijah Mastrianni, DO [facility] Pager: [phone] 08/20/2012, 7:48 PM

## 2012-08-31 LAB — FUNGUS CULTURE W SMEAR: Fungal Smear: NONE SEEN

## 2012-09-18 LAB — AFB CULTURE WITH SMEAR (NOT AT ARMC)

## 2012-12-19 ENCOUNTER — Ambulatory Visit (INDEPENDENT_AMBULATORY_CARE_PROVIDER_SITE_OTHER): Payer: BC Managed Care – PPO | Admitting: Family Medicine

## 2012-12-19 VITALS — BP 124/84 | HR 84 | Temp 98.2°F | Resp 18 | Ht 66.0 in | Wt 143.0 lb

## 2012-12-19 DIAGNOSIS — W540XXA Bitten by dog, initial encounter: Secondary | ICD-10-CM

## 2012-12-19 DIAGNOSIS — T148XXA Other injury of unspecified body region, initial encounter: Secondary | ICD-10-CM

## 2012-12-19 DIAGNOSIS — L03119 Cellulitis of unspecified part of limb: Secondary | ICD-10-CM

## 2012-12-19 MED ORDER — TRAMADOL HCL 50 MG PO TABS: 50.0000 mg | ORAL_TABLET | Freq: Three times a day (TID) | ORAL | Status: DC | PRN

## 2012-12-19 MED ORDER — KETOROLAC TROMETHAMINE 60 MG/2ML IM SOLN
60.0000 mg | Freq: Once | INTRAMUSCULAR | Status: AC
  Administered 2012-12-19: 60 mg via INTRAMUSCULAR

## 2012-12-19 MED ORDER — CEFTRIAXONE SODIUM 1 G IJ SOLR
1.0000 g | Freq: Once | INTRAMUSCULAR | Status: AC
  Administered 2012-12-19: 1 g via INTRAMUSCULAR

## 2012-12-19 MED ORDER — AMOXICILLIN-POT CLAVULANATE 875-125 MG PO TABS: 1.0000 | ORAL_TABLET | Freq: Two times a day (BID) | ORAL | Status: DC

## 2012-12-19 MED ORDER — DOXYCYCLINE HYCLATE 100 MG PO TABS: 100.0000 mg | ORAL_TABLET | Freq: Two times a day (BID) | ORAL | Status: DC

## 2012-12-19 NOTE — Progress Notes (Signed)
   Patient ID: Martin Chapman MRN: [id number], DOB: 07-21-1991, 22 y.o. Date of Encounter: 12/19/2012, 8:12 PM   PROCEDURE NOTE: Verbal consent obtained.  Numbing: Local anesthesia obtained with 1 cc of 1% of plain lidocaine.  Eschar lifted with 18 gauge needle.  Purulence expressed. Culture taken. Wound debrided.  Wound dressed.  At the end of the visit patient asked me to change his tramadol to "Vicodin or something." I discussed this with his evaluating provider and told the patient the plan was to treat him with tramadol. Patient was not happy about this and asked if he could call to state that he was still in pain.    SignedEula Listen, PA-C 12/19/2012 8:12 PM
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
## 2012-12-19 NOTE — Patient Instructions (Signed)
I am giving you two different antibiotics.  Take them twice daily but do not take together.  Also take tramadol for pain every 8 hours as needed.  If still in pain can add 400mg  of ibuprofen. Come back in 48 hours and we want to make sure you are getting better.   Animal Bite An animal bite can result in a scratch on the skin, deep open cut, puncture of the skin, crush injury, or tearing away of the skin or a body part. Dogs are responsible for most animal bites. Children are bitten more often than adults. An animal bite can range from very mild to more serious. A small bite from your house pet is no cause for alarm. However, some animal bites can become infected or injure a bone or other tissue. You must seek medical care if:  The skin is broken and bleeding does not slow down or stop after 15 minutes.  The puncture is deep and difficult to clean (such as a cat bite).  Pain, warmth, redness, or pus develops around the wound.  The bite is from a stray animal or rodent. There may be a risk of rabies infection.  The bite is from a snake, raccoon, skunk, fox, coyote, or bat. There may be a risk of rabies infection.  The person bitten has a chronic illness such as diabetes, liver disease, or cancer, or the person takes medicine that lowers the immune system.  There is concern about the location and severity of the bite. It is important to clean and protect an animal bite wound right away to prevent infection. Follow these steps:  Clean the wound with plenty of water and soap.  Apply an antibiotic cream.  Apply gentle pressure over the wound with a clean towel or gauze to slow or stop bleeding.  Elevate the affected area above the heart to help stop any bleeding.  Seek medical care. Getting medical care within 8 hours of the animal bite leads to the best possible outcome. DIAGNOSIS  Your caregiver will most likely:  Take a detailed history of the animal and the bite injury.  Perform a  wound exam.  Take your medical history. Blood tests or X-rays may be performed. Sometimes, infected bite wounds are cultured and sent to a lab to identify the infectious bacteria.  TREATMENT  Medical treatment will depend on the location and type of animal bite as well as the patient's medical history. Treatment may include:  Wound care, such as cleaning and flushing the wound with saline solution, bandaging, and elevating the affected area.  Antibiotics.  Tetanus immunization.  Rabies immunization.  Leaving the wound open to heal. This is often done with animal bites, due to the high risk of infection. However, in certain cases, wound closure with stitches, wound adhesive, skin adhesive strips, or staples may be used. Infected bites that are left untreated may require intravenous (IV) antibiotics and surgical treatment in the hospital. HOME CARE INSTRUCTIONS  Follow your caregiver's instructions for wound care.  Take all medicines as directed.  If your caregiver prescribes antibiotics, take them as directed. Finish them even if you start to feel better.  Follow up with your caregiver for further exams or immunizations as directed. You may need a tetanus shot if:  You cannot remember when you had your last tetanus shot.  You have never had a tetanus shot.  The injury broke your skin. If you get a tetanus shot, your arm may swell, get red, and  feel warm to the touch. This is common and not a problem. If you need a tetanus shot and you choose not to have one, there is a rare chance of getting tetanus. Sickness from tetanus can be serious. SEEK MEDICAL CARE IF:  You notice warmth, redness, soreness, swelling, pus discharge, or a bad smell coming from the wound.  You have a red line on the skin coming from the wound.  You have a fever, chills, or a general ill feeling.  You have nausea or vomiting.  You have continued or worsening pain.  You have trouble moving the injured  part.  You have other questions or concerns. MAKE SURE YOU:  Understand these instructions.  Will watch your condition.  Will get help right away if you are not doing well or get worse. Document Released: 06/07/2011 Document Revised: 12/12/2011 Document Reviewed: 06/07/2011 [facility] Patient Information 2013 [place], [place].

## 2012-12-20 ENCOUNTER — Encounter: Payer: Self-pay | Admitting: Family Medicine

## 2012-12-20 NOTE — Progress Notes (Signed)
patient discussed and examined with Dr. Katrinka Blazing. Agree with assessment and plan of care per his note.

## 2012-12-22 LAB — WOUND CULTURE
Gram Stain: NONE SEEN
Gram Stain: NONE SEEN

## 2013-05-01 ENCOUNTER — Encounter (HOSPITAL_BASED_OUTPATIENT_CLINIC_OR_DEPARTMENT_OTHER): Payer: Self-pay

## 2013-05-01 ENCOUNTER — Other Ambulatory Visit (HOSPITAL_BASED_OUTPATIENT_CLINIC_OR_DEPARTMENT_OTHER): Payer: BC Managed Care – PPO

## 2013-05-01 ENCOUNTER — Emergency Department (HOSPITAL_BASED_OUTPATIENT_CLINIC_OR_DEPARTMENT_OTHER)
Admission: EM | Admit: 2013-05-01 | Discharge: 2013-05-01 | Disposition: A | Discharge status: Home / Self Care | Payer: BC Managed Care – PPO | Attending: Emergency Medicine | Admitting: Emergency Medicine

## 2013-05-01 ENCOUNTER — Emergency Department (HOSPITAL_BASED_OUTPATIENT_CLINIC_OR_DEPARTMENT_OTHER): Payer: BC Managed Care – PPO

## 2013-05-01 DIAGNOSIS — S20211A Contusion of right front wall of thorax, initial encounter: Secondary | ICD-10-CM

## 2013-05-01 DIAGNOSIS — Z8659 Personal history of other mental and behavioral disorders: Secondary | ICD-10-CM | POA: Insufficient documentation

## 2013-05-01 DIAGNOSIS — F341 Dysthymic disorder: Secondary | ICD-10-CM | POA: Insufficient documentation

## 2013-05-01 DIAGNOSIS — F17200 Nicotine dependence, unspecified, uncomplicated: Secondary | ICD-10-CM | POA: Insufficient documentation

## 2013-05-01 DIAGNOSIS — Y92410 Unspecified street and highway as the place of occurrence of the external cause: Secondary | ICD-10-CM | POA: Insufficient documentation

## 2013-05-01 DIAGNOSIS — S20219A Contusion of unspecified front wall of thorax, initial encounter: Secondary | ICD-10-CM | POA: Insufficient documentation

## 2013-05-01 DIAGNOSIS — Y9389 Activity, other specified: Secondary | ICD-10-CM | POA: Insufficient documentation

## 2013-05-01 HISTORY — DX: Other psychoactive substance abuse, uncomplicated: F19.10

## 2013-05-01 MED ORDER — OXYCODONE-ACETAMINOPHEN 5-325 MG PO TABS
1.0000 | ORAL_TABLET | Freq: Once | ORAL | Status: AC
  Administered 2013-05-01: 1 via ORAL
  Filled 2013-05-01 (×2): qty 1

## 2013-05-01 MED ORDER — OXYCODONE-ACETAMINOPHEN 5-325 MG PO TABS: 1.0000 | ORAL_TABLET | ORAL | Status: DC | PRN

## 2013-05-01 NOTE — ED Notes (Signed)
Pt out at nurses' station questioning about wait times. Pt informed that he would be seen as soon as possible. Pt now asking registrar about his ride, friends in lobby, etc. Pt ambulated back to room with a steady gait in NAD.

## 2013-05-01 NOTE — ED Notes (Signed)
MD at bedside. 

## 2013-05-01 NOTE — ED Notes (Signed)
Pt up ambulating around dept once again looking for his ride and wanting to use a phone, pt redirected to his room and given phone to use.
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
## 2013-05-07 ENCOUNTER — Emergency Department (HOSPITAL_BASED_OUTPATIENT_CLINIC_OR_DEPARTMENT_OTHER)
Admission: EM | Admit: 2013-05-07 | Discharge: 2013-05-07 | Disposition: A | Discharge status: Home / Self Care | Payer: BC Managed Care – PPO | Attending: Emergency Medicine | Admitting: Emergency Medicine

## 2013-05-07 ENCOUNTER — Encounter (HOSPITAL_BASED_OUTPATIENT_CLINIC_OR_DEPARTMENT_OTHER): Payer: Self-pay | Admitting: *Deleted

## 2013-05-07 ENCOUNTER — Emergency Department (HOSPITAL_BASED_OUTPATIENT_CLINIC_OR_DEPARTMENT_OTHER): Payer: BC Managed Care – PPO

## 2013-05-07 DIAGNOSIS — F17200 Nicotine dependence, unspecified, uncomplicated: Secondary | ICD-10-CM | POA: Insufficient documentation

## 2013-05-07 DIAGNOSIS — F410 Panic disorder [episodic paroxysmal anxiety] without agoraphobia: Secondary | ICD-10-CM | POA: Insufficient documentation

## 2013-05-07 DIAGNOSIS — F3289 Other specified depressive episodes: Secondary | ICD-10-CM | POA: Insufficient documentation

## 2013-05-07 DIAGNOSIS — R079 Chest pain, unspecified: Secondary | ICD-10-CM | POA: Insufficient documentation

## 2013-05-07 DIAGNOSIS — F329 Major depressive disorder, single episode, unspecified: Secondary | ICD-10-CM | POA: Insufficient documentation

## 2013-05-07 DIAGNOSIS — G8911 Acute pain due to trauma: Secondary | ICD-10-CM | POA: Insufficient documentation

## 2013-05-07 DIAGNOSIS — R0781 Pleurodynia: Secondary | ICD-10-CM

## 2013-05-07 DIAGNOSIS — R109 Unspecified abdominal pain: Secondary | ICD-10-CM | POA: Insufficient documentation

## 2013-05-07 DIAGNOSIS — F411 Generalized anxiety disorder: Secondary | ICD-10-CM | POA: Insufficient documentation

## 2013-05-07 HISTORY — DX: Opioid abuse, uncomplicated: F11.10

## 2013-05-07 MED ORDER — TRAMADOL HCL 50 MG PO TABS: 50.0000 mg | ORAL_TABLET | Freq: Four times a day (QID) | ORAL | Status: DC | PRN

## 2013-05-07 NOTE — ED Notes (Signed)
Pt ambulatory in dept without difficulty. Talking on phone. Does not appear in acute distress or pain.

## 2013-05-07 NOTE — ED Notes (Signed)
PA and RN in to discharge pt. Pt asking PA "can I get a prescription for actual pain medication? Can I get 15 more hydrocodone?" PA denied request. Pt left room cursing stating "this is bullshit". Pt was given rx for tramadol.
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
## 2013-05-07 NOTE — ED Provider Notes (Signed)
Medical screening examination/treatment/procedure(s) were performed by non-physician practitioner and as supervising physician I was immediately available for consultation/collaboration.  Cavan Bearden M Damion Kant, MD 05/07/13 2309 

## 2013-09-21 ENCOUNTER — Encounter (HOSPITAL_COMMUNITY): Payer: Self-pay | Admitting: Emergency Medicine

## 2013-09-21 ENCOUNTER — Emergency Department (HOSPITAL_COMMUNITY)
Admission: EM | Admit: 2013-09-21 | Discharge: 2013-09-21 | Discharge status: Against Medical Advice | Payer: BC Managed Care – PPO | Attending: Emergency Medicine | Admitting: Emergency Medicine

## 2013-09-21 DIAGNOSIS — F1910 Other psychoactive substance abuse, uncomplicated: Secondary | ICD-10-CM

## 2013-09-21 DIAGNOSIS — F411 Generalized anxiety disorder: Secondary | ICD-10-CM | POA: Insufficient documentation

## 2013-09-21 DIAGNOSIS — F329 Major depressive disorder, single episode, unspecified: Secondary | ICD-10-CM | POA: Insufficient documentation

## 2013-09-21 DIAGNOSIS — F17200 Nicotine dependence, unspecified, uncomplicated: Secondary | ICD-10-CM | POA: Insufficient documentation

## 2013-09-21 DIAGNOSIS — R450 Nervousness: Secondary | ICD-10-CM | POA: Insufficient documentation

## 2013-09-21 DIAGNOSIS — Z880 Allergy status to penicillin: Secondary | ICD-10-CM | POA: Insufficient documentation

## 2013-09-21 DIAGNOSIS — Z792 Long term (current) use of antibiotics: Secondary | ICD-10-CM | POA: Insufficient documentation

## 2013-09-21 DIAGNOSIS — F3289 Other specified depressive episodes: Secondary | ICD-10-CM | POA: Insufficient documentation

## 2013-09-21 DIAGNOSIS — F1120 Opioid dependence, uncomplicated: Secondary | ICD-10-CM | POA: Insufficient documentation

## 2013-09-21 DIAGNOSIS — Z79899 Other long term (current) drug therapy: Secondary | ICD-10-CM | POA: Insufficient documentation

## 2013-09-21 DIAGNOSIS — R45851 Suicidal ideations: Secondary | ICD-10-CM | POA: Insufficient documentation

## 2013-09-21 LAB — CBC
HCT: 41.9 % (ref 39.0–52.0)
Hemoglobin: 14.3 g/dL (ref 13.0–17.0)
MCV: 84.5 fL (ref 78.0–100.0)
RBC: 4.96 MIL/uL (ref 4.22–5.81)
WBC: 11.1 10*3/uL — ABNORMAL HIGH (ref 4.0–10.5)

## 2013-09-21 LAB — COMPREHENSIVE METABOLIC PANEL
Albumin: 4.9 g/dL (ref 3.5–5.2)
Alkaline Phosphatase: 85 U/L (ref 39–117)
BUN: 16 mg/dL (ref 6–23)
CO2: 27 mEq/L (ref 19–32)
Chloride: 100 mEq/L (ref 96–112)
Creatinine, Ser: 0.81 mg/dL (ref 0.50–1.35)
GFR calc Af Amer: >90 mL/min (ref >90)
GFR calc non Af Amer: >90 mL/min (ref >90)
Glucose, Bld: 88 mg/dL (ref 70–99)
Potassium: 3.7 mEq/L (ref 3.5–5.1)
Total Bilirubin: 0.6 mg/dL (ref 0.3–1.2)

## 2013-09-21 LAB — ETHANOL: Alcohol, Ethyl (B): <11 mg/dL (ref 0–11)

## 2013-09-21 LAB — ACETAMINOPHEN LEVEL: Acetaminophen (Tylenol), Serum: <15 ug/mL (ref 10–30)

## 2013-09-21 NOTE — ED Notes (Signed)
Per EMS, Patient from home was arguing with his girlfriend b/c she was cheating on him. Patient has 6 year hx of heroin use, had been clean for sometime, but used tonight and took a Building services engineer of the needle to his girlfriend, stating he was going to kill himself. Patient's mother found him after use, was drowsy. Patient was conscious, alert, and oriented when EMS arrived. Patient's mother is on the way to magistrates office to take out IVC paperwork, (patient is unaware of this detail). Patient states to his mother daily that he wants to kill himself, patient denies this to EMS. Patient states he is coming in to get referral to [facility] for ceboxin.

## 2013-09-21 NOTE — ED Notes (Signed)
Pt arrived via EMS with a complaint of drug use.  Pt's mother called EMS when she found pt in the bathroom using heroin.  Pt was initially slurring his speech but came around after arousal.  Pt's mother has gone to police to draw IVC paper work but pt is unaware of said. such.  Pt is a current everyday user.

## 2013-09-21 NOTE — ED Notes (Signed)
Bed: WLPT2 Expected date: 09/21/13 Expected time: 10:00 PM Means of arrival: Ambulance Comments: Heroin od/detox

## 2013-09-21 NOTE — ED Notes (Signed)
Pt belongings consist of black/white hooded jacket, long sleeve light green tshirt, black stone washed jeans, black belt, black sneakers, orange and grey socks, cig lighter, cell phone.Marland KitchenMarland Kitchen

## 2013-09-21 NOTE — ED Provider Notes (Signed)
CSN: [id number]     Arrival date & time 09/21/13  2206 History  This chart was scribed for non-physician practitioner Kyung Bacca, PA-C working with Flint Melter, MD by Joaquin Music, ED Scribe. This patient was seen in room WTR2/WLPT2 and the patient's care was started at  10:31 PM  Chief Complaint  Patient presents with  . Medical Clearance    heroin use today, 6 yr hx, threathened SI to gf today, regularly to his mother   The history is provided by the patient. No language interpreter was used.   HPI Comments: Martin Chapman is a 22 y.o. male who presents to the Emergency Department complaining of medical clearance. Pt reports doing heroin, states he "noded off" and his mother called EMS. He states his mother suspected he almost died tonight. He states he did a greater amount of heroin compared to normal due to having a bad day. Pt states he has been using heroine this time for a month and a half. He states he overdosed once before and had to be admitted due to possible infection to R forearm. Pt reports going to rehab 3 times and states he always relapses. He states he would like to quit but states he is addicted to sovoxin. Pt states if he is unable to get sovaxcin, he "gets dope". Pt denies SI and HI. Pt states he is an occasional drinker.  Past Medical History  Diagnosis Date  . Anxiety   . Panic attacks   . Depression   . Polysubstance abuse   . Opiate abuse, continuous    Past Surgical History  Procedure Laterality Date  . Extraction of wisdom teeth    . Intraoperative arteriogram  07/22/2012    Procedure: INTRA OPERATIVE ARTERIOGRAM;  Surgeon: Sherren Kerns, MD;  Location: [facility] OR;  Service: Vascular;  Laterality: Left;  arch aortagram, second order catheterization right subclavian artery, ultrasound left groin  . Insertion of dialysis catheter  07/22/2012    Procedure: INSERTION OF DIALYSIS CATHETER;  Surgeon: Sherren Kerns, MD;  Location: [facility] OR;   Service: Vascular;  Laterality: Right;  . Fasciotomy  07/22/2012    Procedure: FASCIOTOMY;  Surgeon: Nadara Mustard, MD;  Location: [facility] OR;  Service: Orthopedics;  Laterality: Right;  . I&d extremity  07/24/2012    Procedure: IRRIGATION AND DEBRIDEMENT EXTREMITY;  Surgeon: Nadara Mustard, MD;  Location: [facility] OR;  Service: Orthopedics;  Laterality: Right;  Right Arm Irrigation and Debridement, VAC Application  . Application of wound vac  07/24/2012    Procedure: APPLICATION OF WOUND VAC;  Surgeon: Nadara Mustard, MD;  Location: [facility] OR;  Service: Orthopedics;  Laterality: Right;  . Insertion of dialysis catheter  07/31/2012    Procedure: INSERTION OF DIALYSIS CATHETER;  Surgeon: Nada Libman, MD;  Location: [facility] OR;  Service: Vascular;  Laterality: Left;  internal jugular   Family History  Problem Relation Age of Onset  . Hypertension Father    History  Substance Use Topics  . Smoking status: Current Every Day Smoker -- 1.00 packs/day    Types: Cigarettes  . Smokeless tobacco: Not on file  . Alcohol Use: Yes    Review of Systems  Psychiatric/Behavioral: Negative for suicidal ideas and hallucinations. The patient is nervous/anxious.   All other systems reviewed and are negative.   Allergies  Review of patient's allergies indicates no known allergies.  Home Medications   Current Outpatient Rx  Name  Route  Sig  Dispense  Refill  . amoxicillin-clavulanate (AUGMENTIN) 875-125 MG per tablet   Oral   Take 1 tablet by mouth 2 (two) times daily.   20 tablet   0   . cloNIDine (CATAPRES) 0.2 MG tablet   Oral   Take 1 tablet (0.2 mg total) by mouth 3 (three) times daily.   90 tablet   0   . divalproex (DEPAKOTE SPRINKLE) 125 MG capsule   Oral   Take 2 capsules (250 mg total) by mouth 2 (two) times daily.   120 capsule   0   . doxycycline (VIBRA-TABS) 100 MG tablet   Oral   Take 1 tablet (100 mg total) by mouth 2 (two) times daily.   20 tablet   0   . oxyCODONE (OXY  IR/ROXICODONE) 5 MG immediate release tablet   Oral   Take 1 tablet (5 mg total) by mouth every 6 (six) hours as needed for pain.   20 tablet   0   . oxyCODONE-acetaminophen (PERCOCET/ROXICET) 5-325 MG per tablet   Oral   Take 1 tablet by mouth every 4 (four) hours as needed for pain.   15 tablet   0   . silver sulfADIAZINE (SILVADENE) 1 % cream   Topical   Apply topically daily. Apply to right arm wound daily with dressing once daily after cleansing with soap and water   50 g   0   . thiamine 100 MG tablet   Oral   Take 1 tablet (100 mg total) by mouth daily.   30 tablet   0   . traMADol (ULTRAM) 50 MG tablet   Oral   Take 1 tablet (50 mg total) by mouth every 8 (eight) hours as needed for pain.   30 tablet   0   . traMADol (ULTRAM) 50 MG tablet   Oral   Take 1 tablet (50 mg total) by mouth every 6 (six) hours as needed for pain.   15 tablet   0   . UNABLE TO FIND      Occupational and Physical Therapy to evaluate and treat   1 each   0    BP 126/84  Pulse 88  Temp(Src) 98.2 F (36.8 C) (Oral)  Resp 14  SpO2 97% Physical Exam  Nursing note and vitals reviewed. Constitutional: He is oriented to person, place, and time. He appears well-developed and well-nourished. No distress.  HENT:  Head: Normocephalic and atraumatic.  Eyes:  Normal appearance  Neck: Normal range of motion.  Cardiovascular: Normal rate and regular rhythm.   Pulmonary/Chest: Effort normal and breath sounds normal. No respiratory distress.  Musculoskeletal: Normal range of motion.  Neurological: He is alert and oriented to person, place, and time.  Skin: Skin is warm and dry. No rash noted.  Psychiatric: He has a normal mood and affect. His behavior is normal.  restless    ED Course  Procedures  Labs Review Labs Reviewed  CBC - Abnormal; Notable for the following:    WBC 11.1 (*)    All other components within normal limits  ACETAMINOPHEN LEVEL  COMPREHENSIVE METABOLIC PANEL   ETHANOL  SALICYLATE LEVEL  URINE RAPID DRUG SCREEN (HOSP PERFORMED)   Imaging Review No results found.  EKG Interpretation   None       MDM   1. Drug abuse    22yo heroine addict transported to ED by EMS when his mother found him sleeping on bathroom floor w/ needle beside him.  She is reportedly  in process of taking out IVC paperwork because he has recently complained of suicidal ideation.  Patient requested inpatient detox initially but now wants to leave.  There is no indication to hold him involuntarily.  He has denied SI/HI to both EMS and myself.  He is not intoxicated; capable of making own decisions. VS are w/in nml range.  I attempted to contact his mother and she did not answer at home or her cell.  Nursing staff signed him out AMA.  11:14 PM   I personally performed the services described in this documentation, which was scribed in my presence. The recorded information has been reviewed and is accurate.     Otilio Miu, PA-C 09/22/13 2009

## 2013-09-22 ENCOUNTER — Encounter (HOSPITAL_COMMUNITY): Payer: Self-pay | Admitting: Emergency Medicine

## 2013-09-22 ENCOUNTER — Inpatient Hospital Stay (HOSPITAL_COMMUNITY)
Admission: AD | Admit: 2013-09-22 | Discharge: 2013-09-25 | DRG: 885 | Disposition: A | Discharge status: Home / Self Care | Payer: BC Managed Care – PPO | Source: Intra-hospital | Attending: Psychiatry | Admitting: Psychiatry

## 2013-09-22 ENCOUNTER — Emergency Department (HOSPITAL_COMMUNITY)
Admission: EM | Admit: 2013-09-22 | Discharge: 2013-09-22 | Disposition: A | Discharge status: Other Acute Inpatient Hospital | Payer: BC Managed Care – PPO | Attending: Emergency Medicine | Admitting: Emergency Medicine

## 2013-09-22 ENCOUNTER — Encounter (HOSPITAL_COMMUNITY): Payer: Self-pay

## 2013-09-22 DIAGNOSIS — F329 Major depressive disorder, single episode, unspecified: Secondary | ICD-10-CM

## 2013-09-22 DIAGNOSIS — F1120 Opioid dependence, uncomplicated: Secondary | ICD-10-CM

## 2013-09-22 DIAGNOSIS — F17200 Nicotine dependence, unspecified, uncomplicated: Secondary | ICD-10-CM | POA: Insufficient documentation

## 2013-09-22 DIAGNOSIS — X838XXA Intentional self-harm by other specified means, initial encounter: Secondary | ICD-10-CM

## 2013-09-22 DIAGNOSIS — M6282 Rhabdomyolysis: Secondary | ICD-10-CM

## 2013-09-22 DIAGNOSIS — T1491XA Suicide attempt, initial encounter: Secondary | ICD-10-CM

## 2013-09-22 DIAGNOSIS — F332 Major depressive disorder, recurrent severe without psychotic features: Principal | ICD-10-CM | POA: Diagnosis present

## 2013-09-22 DIAGNOSIS — F411 Generalized anxiety disorder: Secondary | ICD-10-CM | POA: Diagnosis present

## 2013-09-22 DIAGNOSIS — F1110 Opioid abuse, uncomplicated: Secondary | ICD-10-CM | POA: Insufficient documentation

## 2013-09-22 DIAGNOSIS — T50911A Poisoning by multiple unspecified drugs, medicaments and biological substances, accidental (unintentional), initial encounter: Secondary | ICD-10-CM

## 2013-09-22 DIAGNOSIS — Z8659 Personal history of other mental and behavioral disorders: Secondary | ICD-10-CM | POA: Insufficient documentation

## 2013-09-22 DIAGNOSIS — F1910 Other psychoactive substance abuse, uncomplicated: Secondary | ICD-10-CM

## 2013-09-22 LAB — RAPID URINE DRUG SCREEN, HOSP PERFORMED
Barbiturates: NOT DETECTED
Benzodiazepines: NOT DETECTED

## 2013-09-22 MED ORDER — ALUM & MAG HYDROXIDE-SIMETH 200-200-20 MG/5ML PO SUSP: 30.0000 mL | ORAL | Status: DC | PRN

## 2013-09-22 MED ORDER — CLONIDINE HCL 0.1 MG PO TABS: 0.1000 mg | ORAL_TABLET | Freq: Every day | ORAL | Status: DC

## 2013-09-22 MED ORDER — NAPROXEN 500 MG PO TABS
500.0000 mg | ORAL_TABLET | Freq: Two times a day (BID) | ORAL | Status: DC | PRN
  Administered 2013-09-23: 500 mg via ORAL
  Filled 2013-09-22 (×2): qty 1

## 2013-09-22 MED ORDER — ONDANSETRON HCL 4 MG PO TABS: 4.0000 mg | ORAL_TABLET | Freq: Three times a day (TID) | ORAL | Status: DC | PRN

## 2013-09-22 MED ORDER — ONDANSETRON 4 MG PO TBDP: 4.0000 mg | ORAL_TABLET | Freq: Four times a day (QID) | ORAL | Status: DC | PRN

## 2013-09-22 MED ORDER — METHOCARBAMOL 500 MG PO TABS
500.0000 mg | ORAL_TABLET | Freq: Three times a day (TID) | ORAL | Status: DC | PRN
  Administered 2013-09-22: 500 mg via ORAL
  Filled 2013-09-22: qty 1

## 2013-09-22 MED ORDER — LORAZEPAM 1 MG PO TABS: 1.0000 mg | ORAL_TABLET | Freq: Three times a day (TID) | ORAL | Status: DC | PRN

## 2013-09-22 MED ORDER — DICYCLOMINE HCL 20 MG PO TABS: 20.0000 mg | ORAL_TABLET | Freq: Four times a day (QID) | ORAL | Status: DC | PRN

## 2013-09-22 MED ORDER — NICOTINE 21 MG/24HR TD PT24
21.0000 mg | MEDICATED_PATCH | Freq: Every day | TRANSDERMAL | Status: DC
  Administered 2013-09-22 (×2): 21 mg via TRANSDERMAL
  Filled 2013-09-22 (×2): qty 1

## 2013-09-22 MED ORDER — HYDROXYZINE HCL 25 MG PO TABS
25.0000 mg | ORAL_TABLET | Freq: Four times a day (QID) | ORAL | Status: DC | PRN
  Administered 2013-09-22 – 2013-09-23 (×3): 25 mg via ORAL
  Filled 2013-09-22: qty 1
  Filled 2013-09-22: qty 6
  Filled 2013-09-22: qty 1

## 2013-09-22 MED ORDER — IBUPROFEN 200 MG PO TABS: 600.0000 mg | ORAL_TABLET | Freq: Three times a day (TID) | ORAL | Status: DC | PRN

## 2013-09-22 MED ORDER — HYDROXYZINE HCL 25 MG PO TABS
25.0000 mg | ORAL_TABLET | Freq: Four times a day (QID) | ORAL | Status: DC | PRN
  Administered 2013-09-22: 25 mg via ORAL
  Filled 2013-09-22: qty 1

## 2013-09-22 MED ORDER — NICOTINE POLACRILEX 2 MG MT GUM
2.0000 mg | CHEWING_GUM | OROMUCOSAL | Status: DC | PRN
  Administered 2013-09-22 – 2013-09-24 (×4): 2 mg via ORAL
  Filled 2013-09-22: qty 1

## 2013-09-22 MED ORDER — ACETAMINOPHEN 325 MG PO TABS
650.0000 mg | ORAL_TABLET | Freq: Four times a day (QID) | ORAL | Status: DC | PRN
  Administered 2013-09-23: 650 mg via ORAL
  Filled 2013-09-22: qty 2

## 2013-09-22 MED ORDER — CLONIDINE HCL 0.1 MG PO TABS
0.1000 mg | ORAL_TABLET | Freq: Four times a day (QID) | ORAL | Status: DC
  Administered 2013-09-22: 0.1 mg via ORAL
  Filled 2013-09-22: qty 1

## 2013-09-22 MED ORDER — LOPERAMIDE HCL 2 MG PO CAPS: 2.0000 mg | ORAL_CAPSULE | ORAL | Status: DC | PRN

## 2013-09-22 MED ORDER — CLONIDINE HCL 0.1 MG PO TABS
0.1000 mg | ORAL_TABLET | Freq: Every day | ORAL | Status: DC
  Filled 2013-09-22: qty 1

## 2013-09-22 MED ORDER — TRAZODONE HCL 50 MG PO TABS
50.0000 mg | ORAL_TABLET | Freq: Every evening | ORAL | Status: DC | PRN
  Administered 2013-09-22 – 2013-09-23 (×4): 50 mg via ORAL
  Filled 2013-09-22 (×3): qty 1

## 2013-09-22 MED ORDER — CLONIDINE HCL 0.1 MG PO TABS: 0.1000 mg | ORAL_TABLET | ORAL | Status: DC

## 2013-09-22 MED ORDER — CLONIDINE HCL 0.1 MG PO TABS
0.1000 mg | ORAL_TABLET | Freq: Four times a day (QID) | ORAL | Status: AC
  Administered 2013-09-22 – 2013-09-24 (×7): 0.1 mg via ORAL
  Filled 2013-09-22 (×10): qty 1

## 2013-09-22 MED ORDER — DICYCLOMINE HCL 20 MG PO TABS
20.0000 mg | ORAL_TABLET | Freq: Four times a day (QID) | ORAL | Status: DC | PRN
  Administered 2013-09-22 – 2013-09-23 (×3): 20 mg via ORAL
  Filled 2013-09-22 (×3): qty 1

## 2013-09-22 MED ORDER — MAGNESIUM HYDROXIDE 400 MG/5ML PO SUSP: 30.0000 mL | Freq: Every day | ORAL | Status: DC | PRN

## 2013-09-22 MED ORDER — NAPROXEN 500 MG PO TABS
500.0000 mg | ORAL_TABLET | Freq: Two times a day (BID) | ORAL | Status: DC | PRN
  Administered 2013-09-22: 500 mg via ORAL
  Filled 2013-09-22: qty 1

## 2013-09-22 MED ORDER — METHOCARBAMOL 500 MG PO TABS
500.0000 mg | ORAL_TABLET | Freq: Three times a day (TID) | ORAL | Status: DC | PRN
  Administered 2013-09-22 – 2013-09-25 (×4): 500 mg via ORAL
  Filled 2013-09-22 (×4): qty 1

## 2013-09-22 MED ORDER — CLONIDINE HCL 0.1 MG PO TABS
0.1000 mg | ORAL_TABLET | ORAL | Status: DC
  Administered 2013-09-24 – 2013-09-25 (×2): 0.1 mg via ORAL
  Filled 2013-09-22 (×4): qty 1

## 2013-09-22 NOTE — [facility] Counselor (Signed)
Spoke with mother.  Pt called mother 09-21-13 to pick him up at ex girlfriends house.  They broke up earlier in the week.  On the way home, he told his mother (per mother) "I just don't want to live anymore."  Mother took pt to where she was baby sitting (for her BF) and not their house.  He went to the bathroom and mother received a call from GF and she said he sent a text to her saying he was killing himself and sent a photo of him shooting up  Mother went to bathroom and he was slumped over and EMS was called  2 years ago he was at [facility] drug rehab  1 year ago he was in [place] for 1 month due to OD and per mother pt almost died.  Intensive Care Unit.  Almost lost arm in this process.  Lost bicep muscle and part of triceps.  Pt on probation for drug possession.    Mother  # is [phone]

## 2013-09-22 NOTE — ED Notes (Signed)
[facility] here to transport to [facility]. Ambulatory without difficulty. No complaints voiced. Belongings bag x2 given to [employer].

## 2013-09-22 NOTE — ED Notes (Signed)
Pt arrived in the custody of the [facility] after being IVC'd by his mother.  Pt was here previously and seen for possible heroin overdose.  Pt was found by his mother doing heroin.  Pt's IVC paperwork states he is suicidal and that his earlier drug use included pill and was attempt at suicide.  Pt adamantly  denies SI/HI.  Pt has been a every day user for a period of time.  Pt had previous time of sobriety.

## 2013-09-22 NOTE — Tx Team (Signed)
Initial Interdisciplinary Treatment Plan  PATIENT STRENGTHS: (choose at least two) General fund of knowledge  PATIENT STRESSORS: Substance abuse   PROBLEM LIST: Problem List/Patient Goals Date to be addressed Date deferred Reason deferred Estimated date of resolution  SA 09/22/13     Risk for Suicide 09/22/13                                                DISCHARGE CRITERIA:  Improved stabilization in mood, thinking, and/or behavior Motivation to continue treatment in a less acute level of care  PRELIMINARY DISCHARGE PLAN: Attend aftercare/continuing care group Outpatient therapy  PATIENT/FAMIILY INVOLVEMENT: This treatment plan has been presented to and reviewed with the patient, Martin Chapman  The patient and family have been given the opportunity to ask questions and make suggestions.  Delos Haring 09/22/2013, 7:24 PM

## 2013-09-22 NOTE — Progress Notes (Signed)
Admission Note:  D:22 yr male who presents IVC in no acute distress for the treatment of Detox from Heroin. Pt appears flat and depressed,  Pt was calm and cooperative with admission process. Pt denies SI/HI/AVH/Pain at this time. Pt stated he was in argument with GF and started using heavily for the past 2 weeks. Pt stated he was using for 2 months prior, but hid it from his GF.   A:Skin was assessed and found to be clear of any abnormal marks apart from a scars on R-bicep/tricept from compartment syndrome surgery. POC and unit policies explained and understanding verbalized. Consents obtained. Food and fluids offered, and  accepted.   R:Pt had no additional questions or concerns.

## 2013-09-22 NOTE — ED Notes (Signed)
Called to give report to Scientist, physiological at [facility]....stated she would call us back.

## 2013-09-22 NOTE — ED Notes (Signed)
[facility] notified of transportation needed to [facility]. 

## 2013-09-22 NOTE — ED Provider Notes (Signed)
Medical screening examination/treatment/procedure(s) were performed by non-physician practitioner and as supervising physician I was immediately available for consultation/collaboration.    [employer] Flight M Zuleica Seith, MD 09/22/13 0714 

## 2013-09-22 NOTE — [facility] Counselor (Signed)
Pt accepted to 504-1 per Dr. Gilmore Laroche to Dr. Elsie Saas services.  Pt will go by Martin Chapman due to being IVC  Nurse to call report  Send paper work with pt to [facility]    Pt denies suicidal attempt.  Mother reports pt text girlfriend last night after break up and said he was ending his life and then sent the GF a photo of him shooting heroin in his vein.    The GF called the mother and she went to the pt bathroom and he was slumped against the wall with a needle in his vein.  Mother called EMS.  Mother stated pt OD 11/13 and was in ICU for 30 days at [facility].  Pt has long hx of SA use.  Pt on probation for a related drug charge.  Pt angry with mother and calls this a misunderstanding.    Dr. Gilmore Laroche upheld the IVC once he learned additional information.

## 2013-09-22 NOTE — ED Notes (Signed)
Report called to Linus Orn at [facility]. Dr.Jonnalagadda accepted to room 504-1.
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
## 2013-09-22 NOTE — ED Notes (Signed)
Talked with Gena Fray at [facility]...they will call back for report.
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
## 2013-09-22 NOTE — ED Provider Notes (Signed)
CSN: [id number]     Arrival date & time 09/22/13  0108 History   First MD Initiated Contact with Patient 09/22/13 0131     Chief Complaint  Patient presents with  . Medical Clearance   (Consider location/radiation/quality/duration/timing/severity/associated sxs/prior Treatment) HPI History provided by pt and prior chart.  Pt prior chart, pt came to the ED last night for possible heroin overdose.  He requested heroine detox as well.  His mother was in the process of taking out IVC papers at that time, because he had apparently threatened suicide to his girlfriend.  He denied SI/HI, I had no reason to believe he was a harm to himself/others, he was medically cleared and then left AMA.  He returns now w/ [employer] and IVC paperwork.  He continues to deny SI.  Sent a text message to his girlfriend w/ photo of his heroine and needle, to represent his relapse, no a plan to kill himself.  He was not attempting suicide when he used last night.  Denies abusing alcohol and any other drugs.   Past Medical History  Diagnosis Date  . Anxiety   . Panic attacks   . Depression   . Polysubstance abuse   . Opiate abuse, continuous    Past Surgical History  Procedure Laterality Date  . Extraction of wisdom teeth    . Intraoperative arteriogram  07/22/2012    Procedure: INTRA OPERATIVE ARTERIOGRAM;  Surgeon: Sherren Kerns, MD;  Location: [facility] OR;  Service: Vascular;  Laterality: Left;  arch aortagram, second order catheterization right subclavian artery, ultrasound left groin  . Insertion of dialysis catheter  07/22/2012    Procedure: INSERTION OF DIALYSIS CATHETER;  Surgeon: Sherren Kerns, MD;  Location: [facility] OR;  Service: Vascular;  Laterality: Right;  . Fasciotomy  07/22/2012    Procedure: FASCIOTOMY;  Surgeon: Nadara Mustard, MD;  Location: [facility] OR;  Service: Orthopedics;  Laterality: Right;  . I&d extremity  07/24/2012    Procedure: IRRIGATION AND DEBRIDEMENT EXTREMITY;  Surgeon: Nadara Mustard, MD;  Location:  [facility] OR;  Service: Orthopedics;  Laterality: Right;  Right Arm Irrigation and Debridement, VAC Application  . Application of wound vac  07/24/2012    Procedure: APPLICATION OF WOUND VAC;  Surgeon: Nadara Mustard, MD;  Location: [facility] OR;  Service: Orthopedics;  Laterality: Right;  . Insertion of dialysis catheter  07/31/2012    Procedure: INSERTION OF DIALYSIS CATHETER;  Surgeon: Nada Libman, MD;  Location: [facility] OR;  Service: Vascular;  Laterality: Left;  internal jugular   Family History  Problem Relation Age of Onset  . Hypertension Father    History  Substance Use Topics  . Smoking status: Current Every Day Smoker -- 1.00 packs/day    Types: Cigarettes  . Smokeless tobacco: Not on file  . Alcohol Use: Yes    Review of Systems  All other systems reviewed and are negative.    Allergies  Review of patient's allergies indicates no known allergies.  Home Medications  No current outpatient prescriptions on file. BP 124/73  Pulse 102  Temp(Src) 97.9 F (36.6 C) (Oral)  Resp 16  SpO2 96% Physical Exam  Nursing note and vitals reviewed. Constitutional: He is oriented to person, place, and time. He appears well-developed and well-nourished. No distress.  HENT:  Head: Normocephalic and atraumatic.  Eyes:  Normal appearance  Neck: Normal range of motion.  Pulmonary/Chest: Effort normal.  Musculoskeletal: Normal range of motion.  Neurological: He is alert and oriented to  person, place, and time.  Psychiatric: He has a normal mood and affect. His behavior is normal.  Cooperative and calm    ED Course  Procedures (including critical care time) Labs Review Labs Reviewed  URINE RAPID DRUG SCREEN (HOSP PERFORMED) - Abnormal; Notable for the following:    Opiates POSITIVE (*)    Tetrahydrocannabinol POSITIVE (*)    All other components within normal limits   Imaging Review No results found.  EKG Interpretation   None       MDM   1. Polysubstance abuse    22yo M  IVC'd by mother for heroine abuse and suspected SI.  Patient denies SI and his potential heroine overdose yesterday evening was unintentional.  I do not believe that this patient is a harm to himself and behavior health counselor in agreement.  Pt to see psychiatrist this morning.  Holding orders written.  Medically cleared. 6:23 AM     Otilio Miu, PA-C 09/22/13 [phone]

## 2013-09-23 DIAGNOSIS — F1120 Opioid dependence, uncomplicated: Secondary | ICD-10-CM

## 2013-09-23 DIAGNOSIS — X838XXA Intentional self-harm by other specified means, initial encounter: Secondary | ICD-10-CM

## 2013-09-23 DIAGNOSIS — T50901A Poisoning by unspecified drugs, medicaments and biological substances, accidental (unintentional), initial encounter: Secondary | ICD-10-CM

## 2013-09-23 NOTE — Progress Notes (Signed)
Adult Psychoeducational Group Note  Date:  09/23/2013 Time:  4:46 AM  Group Topic/Focus:  Wrap-Up Group:   The focus of this group is to help patients review their daily goal of treatment and discuss progress on daily workbooks.  Participation Level:  Minimal  Participation Quality:  Appropriate  Affect:  Flat  Cognitive:  Alert  Insight: Good  Engagement in Group:  Engaged  Modes of Intervention:  Discussion  Additional Comments:  Patient says he had an ok day. Patient also stated that he doesn't think that we can do anything here to help him.  [facility] 09/23/2013, 4:46 AM

## 2013-09-23 NOTE — Progress Notes (Signed)
Adult Psychoeducational Group Note  Date:  09/23/2013 Time:  10:33 PM  Group Topic/Focus:  Wrap-Up Group:   The focus of this group is to help patients review their daily goal of treatment and discuss progress on daily workbooks.  Participation Level:  Active  Participation Quality:  Appropriate  Affect:  Appropriate  Cognitive:  Appropriate  Insight: Appropriate  Engagement in Group:  Engaged  Modes of Intervention:  Support  Additional Comments:  Pt stated that one good thing that happened today is that he was able to sleep. Pt was given support to be more involved in his groups since he stated that he goes just to go  Hadleigh Felber 09/23/2013, 10:33 PM

## 2013-09-23 NOTE — Progress Notes (Signed)
Recreation Therapy Notes  Date: 12.22.2014 Time: 3:00pm Location: [address]   Group Topic: Self-Esteem  Goal Area(s) Addresses:  Patient will identify how self-esteem can effect decision making. Patient will identify how self-esteem can effect wellness.    Behavioral Response: Appropriate   Intervention: Art  Activity: Coat of Arms. Patients were asked to identify items to satisfy the following categories: Something I do well, My best trait/feature, Something I value, A turning point in my life, Things I'd like to do, Things I'd like to stop doing.   Education:  Self-Esteem, Wellness, Building control surveyor.   Education Outcome: Acknowledges understanding  Clinical Observations/Feedback: Patient actively engaged in group activity, identifying items to fit each category. Patient made no contributions to group discussion, but appeared to actively listen as he maintained appropriate eye contact with speaker.   Marykay Lex Noella Kipnis, LRT/CTRS  Jearl Klinefelter 09/23/2013 4:25 PM

## 2013-09-23 NOTE — [facility] Suicide Risk Assessment (Signed)
Suicide Risk Assessment  Admission Assessment     Nursing information obtained from:    Demographic factors:    Current Mental Status:    Loss Factors:    Historical Factors:    Risk Reduction Factors:     CLINICAL FACTORS:   Severe Anxiety and/or Agitation Depression:   Hopelessness Impulsivity Insomnia Recent sense of peace/wellbeing Severe Alcohol/Substance Abuse/Dependencies Unstable or Poor Therapeutic Relationship Previous Psychiatric Diagnoses and Treatments  COGNITIVE FEATURES THAT CONTRIBUTE TO RISK:  Closed-mindedness Loss of executive function Polarized thinking Thought constriction (tunnel vision)    SUICIDE RISK:   Moderate:  Frequent suicidal ideation with limited intensity, and duration, some specificity in terms of plans, no associated intent, good self-control, limited dysphoria/symptomatology, some risk factors present, and identifiable protective factors, including available and accessible social support.  PLAN OF CARE: Admit involuntarily and emergently for clonidine detox treatment, crisis stabilization and medication management.  I certify that inpatient services furnished can reasonably be expected to improve the patient's condition.  Martin Chapman,JANARDHAHA R. 09/23/2013, 12:16 PM

## 2013-09-23 NOTE — ED Provider Notes (Signed)
Medical screening examination/treatment/procedure(s) were performed by non-physician practitioner and as supervising physician I was immediately available for consultation/collaboration.  Laurann Mcmorris L Harinder Romas, MD 09/23/13 2121 

## 2013-09-23 NOTE — Tx Team (Signed)
Interdisciplinary Treatment Plan Update   Date Reviewed:  09/23/2013  Time Reviewed:  9:59 AM  Progress in Treatment:   Attending groups: Yes Participating in groups: Yes Taking medication as prescribed: Yes  Tolerating medication: Yes Family/Significant other contact made: No, but will ask patient for consent for collateral contact Patient understands diagnosis: Yes  Discussing patient identified problems/goals with staff: Yes Medical problems stabilized or resolved: Yes Denies suicidal/homicidal ideation: Yes Patient has not harmed self or others: Yes  For review of initial/current patient goals, please see plan of care.  Estimated Length of Stay:  3 days  Reasons for Continued Hospitalization:  Anxiety Depression Medication stabilization   New Problems/Goals identified:    Discharge Plan or Barriers:   Home with outpatient follow up to be determined  Additional Comments:  Attendees:  Patient:  09/23/2013 9:59 AM   Signature: Mervyn Gay, MD 09/23/2013 9:59 AM  Signature:  Silverio Decamp, NP  09/23/2013 9:59 AM  Signature:  09/23/2013 9:59 AM  Signature:Beverly Terrilee Croak, RN 09/23/2013 9:59 AM  Signature:  Neill Loft RN 09/23/2013 9:59 AM  Signature:  Juline Patch, LCSW 09/23/2013 9:59 AM  Signature:   09/23/2013 9:59 AM  Signature:  09/23/2013 9:59 AM  Signature:  Aloha Gell, RN 09/23/2013 9:59 AM  Signature: 09/23/2013  9:59 AM  Signature:   Onnie Boer, RN [facility] 09/23/2013  9:59 AM  Signature:  Elizbeth Squires, Team Lead [place] 09/23/2013  9:59 AM    Scribe for Treatment Team:   Juline Patch,  09/23/2013 9:59 AM

## 2013-09-23 NOTE — [facility] Group Notes (Signed)
[facility] LCSW Aftercare Discharge Planning Group Note   09/23/2013 3:17 PM    Participation Quality:  Appropraite  Mood/Affect:  Appropriate  Depression Rating:  0  Anxiety Rating:  0  Thoughts of Suicide:  No  Will you contract for safety?   NA  Current AVH:  No  Plan for Discharge/Comments:  Patient attended discharge planning group and actively participated in group.  Patient shared in group that he is not interested in follow up as he is not ready to give up his heroin use.  Patient reports having home and transportation.CSW provided all participants with daily workbook.   Transportation Means: Patient has transportation.   Supports:  Patient has a support system.   Mykelti Goldenstein, Joesph July

## 2013-09-23 NOTE — Progress Notes (Signed)
Patient ID: Martin Chapman, male   DOB: 1991-05-08, 22 y.o.   MRN: [id number]  D: Pt stated he tried to explain to the Dr that he doesn't need to be here. Stated, "he has a right to do drugs if he wants." Stated he's not ready to be drug free, and informed the nurse that having suboxone would make it easier for him. Stated he asks the dr to allow him to go home, but was informe "the dr didn't know him well enough".  Pt stated he's not ready to quit.  A:  Support and encouragement was offered. 15 min checks continued for safety.  R: Pt remains safe.

## 2013-09-23 NOTE — H&P (Signed)
Psychiatric Admission Assessment Adult  Patient Identification:  Martin Chapman Date of Evaluation:  09/23/2013 Chief Complaint:  major depressive disorder History of Present Illness:  Patient earlier in the evening had taken a picture of his heroin, needle & spoon and sent it to the girlfriend that he broke up with a week ago. Patient said that ex girlfriend interpreted it as a suicide threat. He meant it to be that he was going to use drugs instead of being around her. Ex girlfriend called mother and said that she thought he was trying to overdose. When mother knocked on his door at the home and entered, he looked knocked out. He had just injected and she thought that he was trying to kill himself so she called EMS. EMS brought him to [facility]. Patient was seen by Cathrine (PA) then left AMA.   In the meantime mother got IVC papers out on patient. He was brought back to [facility]. Patient denies steadfastly any intention, plan or desire to kill himself. Patient says "I was trying to get high." He denies any previous attempt to kill self. He did go to [facility] in June '14. He says that he lied about being suicidal to them so that he could get detox services for his heroin addiction. He has been to [facility] before but says that was a few years ago. Patient admits to some depression about breakup with girlfriend. He says that he has talked about "wishing he were dead" but has no intention to kill self he says. Patient also denies any HI or A/V hallucinations. He has been using between 1/2gram to a full gram for the last month of heroin. Last use was 12/20 around 20:00. Patient also uses marijuana on a regular basis. Detox service was offered to patient but he declined tx currently. He did ask for suboxone tx information and this was provided.  Adamantly denies suicide attempt or past attempts despite documentation stating otherwise.  Elements:  Location:  generalized. Quality:  acute. Severity:   severe. Timing:  constant. Duration:  past two weeks. Context:  stressors. Associated Signs/Synptoms: Depression Symptoms:  depressed mood, feelings of worthlessness/guilt, difficulty concentrating, suicidal attempt, (Hypo) Manic Symptoms:  None Anxiety Symptoms:  Excessive Worry, Psychotic Symptoms:None PTSD Symptoms:  None  Psychiatric Specialty Exam: Physical Exam  Constitutional: He is oriented to person, place, and time. He appears well-developed and well-nourished.  HENT:  Head: Normocephalic and atraumatic.  Neck: Normal range of motion.  Respiratory: Effort normal.  GI: Soft.  Musculoskeletal: Normal range of motion.  Neurological: He is alert and oriented to person, place, and time.  Skin: Skin is warm and dry.   Complete physical performed in ED, reviewed, concur with findings  Review of Systems  Constitutional: Negative.   HENT: Negative.   Eyes: Negative.   Respiratory: Negative.   Cardiovascular: Negative.   Gastrointestinal: Negative.   Genitourinary: Negative.   Musculoskeletal: Negative.   Skin: Negative.   Neurological: Negative.   Endo/Heme/Allergies: Negative.   Psychiatric/Behavioral: Positive for depression and substance abuse. The patient is nervous/anxious.     Blood pressure 95/56, pulse 73, temperature 98.1 F (36.7 C), temperature source Oral, resp. rate 18, height 5' 6.25" (1.683 m), weight 63.504 kg (140 lb).Body mass index is 22.42 kg/(m^2).  General Appearance: Casual  Eye Contact::  Fair  Speech:  Normal Rate  Volume:  Normal  Mood:  Depressed and Irritable  Affect:  Congruent  Thought Process:  Coherent  Orientation:  Full (Time, Place,  and Person)  Thought Content:  WDL  Suicidal Thoughts:  Yes.  with intent/plan  Homicidal Thoughts:  No  Memory:  Immediate;   Fair Recent;   Fair Remote;   Fair  Judgement:  Poor  Insight:  Lacking  Psychomotor Activity:  Decreased  Concentration:  Fair  Recall:  Fair  Akathisia:  No   Handed:  Right  AIMS (if indicated):     Assets:  Physical Health Resilience Social Support  Sleep:  Number of Hours: 5.75    Past Psychiatric History: Diagnosis:  Polysubstance Dependency, depression  Hospitalizations:  [facility]  Outpatient Care:  None  Substance Abuse Care:  [facility], [facility], [facility]  Self-Mutilation:  None  Suicidal Attempts:  Two overdoses  Violent Behaviors:  None   Past Medical History:   Past Medical History  Diagnosis Date  . Anxiety   . Panic attacks   . Depression   . Polysubstance abuse   . Opiate abuse, continuous    Loss of Consciousness:  ten years ago, concussion Allergies:  No Known Allergies PTA Medications: No prescriptions prior to admission    Previous Psychotropic Medications:  Medication/Dose    None   Substance Abuse History in the last 12 months:  yes  Consequences of Substance Abuse: Legal Consequences:  probation Family Consequences:  relationship issues  Social History:  reports that he has been smoking Cigarettes.  He has a 10 pack-year smoking history. He does not have any smokeless tobacco history on file. He reports that he drinks alcohol. He reports that he uses illicit drugs (IV). Additional Social History:  Current Place of Residence:   Place of Birth:   Family Members: Marital Status:  Single Children:  Sons:  Daughters: Relationships: Education:  Corporate treasurer Problems/Performance: Religious Beliefs/Practices: History of Abuse (Emotional/Phsycial/Sexual) Teacher, music History:  None. Legal History: Hobbies/Interests:  Family History:   Family History  Problem Relation Age of Onset  . Hypertension Father     Results for orders placed during the hospital encounter of 09/22/13 (from the past 72 hour(s))  URINE RAPID DRUG SCREEN (HOSP PERFORMED)     Status: Abnormal   Collection Time    09/22/13  2:43 AM      Result Value Range   Opiates POSITIVE (*)  NONE DETECTED   Cocaine NONE DETECTED  NONE DETECTED   Benzodiazepines NONE DETECTED  NONE DETECTED   Amphetamines NONE DETECTED  NONE DETECTED   Tetrahydrocannabinol POSITIVE (*) NONE DETECTED   Barbiturates NONE DETECTED  NONE DETECTED   Comment:            DRUG SCREEN FOR MEDICAL PURPOSES     ONLY.  IF CONFIRMATION IS NEEDED     FOR ANY PURPOSE, NOTIFY LAB     WITHIN 5 DAYS.                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 Anxiety   . Panic attacks   . Depression   . Polysubstance abuse   . Opiate abuse, continuous    AXIS IV:  economic problems, other psychosocial or environmental problems, problems related to social environment and problems with primary support group AXIS V:  41-50 serious symptoms  Treatment Plan/Recommendations:  Plan:  Review of chart, vital signs, medications, and notes. 1-Admit for crisis management and stabilization.  Estimated length of stay 5-7 days past his current stay of 1 2-Individual and group therapy encouraged 3-Medication management for depression, heroin withdrawal/detox and anxiety to reduce current symptoms to base line and improve the patient's overall level of functioning:  Medications reviewed with the patient and PRN medications in place for withdrawal symptoms and Clonidine Protocol for opiate detox 4-Coping  skills for depression, substance abuse, and anxiety developing-- 5-Continue crisis stabilization and management 6-Address health issues--monitoring vital signs, stable  7-Treatment plan in progress to prevent relapse of depression, substance abuse, and anxiety 8-Psychosocial education regarding relapse prevention and self-care 9-Health care follow up as needed for any health concerns  10-Call for consult with hospitalist for additional specialty patient services as needed.  Treatment Plan Summary: Daily contact with patient to assess and evaluate symptoms and progress in treatment Medication management Current Medications:  Current Facility-Administered Medications  Medication Dose Route Frequency Provider Last Rate Last Dose  . acetaminophen (TYLENOL) tablet 650 mg  650 mg Oral Q6H PRN Shuvon Rankin, NP      . cloNIDine (CATAPRES) tablet 0.1 mg  0.1 mg Oral QID Shuvon Rankin, NP   0.1 mg at 09/22/13 2231   Followed by  . [START ON 09/24/2013] cloNIDine (CATAPRES) tablet 0.1 mg  0.1 mg Oral BH-qamhs Shuvon Rankin, NP       Followed by  . [START ON 09/27/2013] cloNIDine (CATAPRES) tablet 0.1 mg  0.1 mg Oral QAC breakfast Shuvon Rankin, NP      . dicyclomine (BENTYL) tablet 20 mg  20 mg Oral Q6H PRN Shuvon Rankin, NP   20 mg at 09/22/13 1956  . hydrOXYzine (ATARAX/VISTARIL) tablet 25 mg  25 mg Oral Q6H PRN Shuvon Rankin, NP   25 mg at 09/22/13 2231  . loperamide (IMODIUM) capsule 2-4 mg  2-4 mg Oral PRN Shuvon Rankin, NP      . magnesium hydroxide (MILK OF MAGNESIA) suspension 30 mL  30 mL Oral Daily PRN Shuvon Rankin, NP      . methocarbamol (ROBAXIN) tablet 500 mg  500 mg Oral Q8H PRN Shuvon Rankin, NP   500 mg at 09/22/13 1956  . naproxen (NAPROSYN) tablet 500 mg  500 mg Oral BID PRN Shuvon Rankin, NP      . nicotine polacrilex (NICORETTE) gum 2 mg  2 mg Oral PRN Nehemiah Settle, MD   2 mg at 09/22/13 1956  . ondansetron (ZOFRAN-ODT) disintegrating tablet 4 mg  4 mg Oral Q6H PRN  Shuvon Rankin, NP      . traZODone (DESYREL) tablet 50 mg  50 mg Oral QHS PRN,MR X 1 Kristeen Mans, NP   50 mg at 09/22/13 2255    Observation  Level/Precautions:  15 minute checks  Laboratory:  completed, reviewed, stable  Psychotherapy:  Individual and group therapy  Medications:  Antidepressant, Clonidine protocol  Consultations:  None  Discharge Concerns:  None    Estimated LOS:  5-7 days  Other:     I certify that inpatient services furnished can reasonably be expected to improve the patient's condition.   Nanine Means, PMH-NP 12/22/20148:21 AM  Patient was seen face-to-face evaluation for psychiatric assessment, suicide risk assessment, discussed with the physician extender and formulated treatment plan. Reviewed the information documented and agree with the treatment plan.  Tavon Magnussen,JANARDHAHA R. 09/23/2013 12:39 PM

## 2013-09-23 NOTE — Progress Notes (Signed)
NUTRITION ASSESSMENT  Pt identified as at risk on the Malnutrition Screen Tool  INTERVENTION: 1. Educated patient on the importance of nutrition and encouraged intake of food and beverages. 2. Discussed weight goals. 3. Supplements: none at this time.  NUTRITION DIAGNOSIS: Unintentional weight loss related to sub-optimal intake as evidenced by pt report.   Goal: Pt to meet >/= 90% of their estimated nutrition needs.  Monitor:  PO intake  Assessment:  Patient admitted with heroine abuse and major depression.  Patient with a 10 lb weight loss in the past 4 months.  Reported that he had not been eating well for the past 2 months secondary to increased drug abuse.  Reports that he is eating currently and does not need anything and does not want to be here.  22 y.o. male  Height: Ht Readings from Last 1 Encounters:  09/22/13 5' 6.25" (1.683 m)    Weight: Wt Readings from Last 1 Encounters:  09/22/13 140 lb (63.504 kg)    Weight Hx: Wt Readings from Last 10 Encounters:  09/22/13 140 lb (63.504 kg)  05/07/13 150 lb (68.04 kg)  05/01/13 150 lb (68.04 kg)  12/19/12 143 lb (64.864 kg)  08/18/12 135 lb 3.2 oz (61.326 kg)  08/18/12 135 lb 3.2 oz (61.326 kg)  08/18/12 135 lb 3.2 oz (61.326 kg)  08/18/12 135 lb 3.2 oz (61.326 kg)    BMI:  Body mass index is 22.42 kg/(m^2). Pt meets criteria for wnl based on current BMI.  Estimated Nutritional Needs: Kcal: 25-30 kcal/kg Protein: > 1 gram protein/kg Fluid: 1 ml/kcal  Diet Order: General Pt is also offered choice of unit snacks mid-morning and mid-afternoon.  Pt is eating as desired.   Lab results and medications reviewed.   Oran Rein, RD, LDN Clinical Inpatient Dietitian Pager:  [phone] Weekend and after hours pager:  [phone]

## 2013-09-23 NOTE — Progress Notes (Signed)
Patient ID: Martin Chapman, male   DOB: Jun 21, 1991, 22 y.o.   MRN: [id number] D- Patient says he is feeling better than he thought he would.  He says he is having pain in his joints and cramping but otherwise feels okay.  He says he is not suicidal and he is not ready to stop using drugs.  Says he knows what to do to quit when he is ready, but does not want to quit at this time.A- Praised patient for his honesty.  R Patient plans to talk with MD about when he will be discharged.

## 2013-09-24 MED ORDER — TRAZODONE HCL 100 MG PO TABS
100.0000 mg | ORAL_TABLET | Freq: Every evening | ORAL | Status: DC | PRN
  Administered 2013-09-24 (×2): 100 mg via ORAL
  Filled 2013-09-24: qty 1
  Filled 2013-09-24: qty 6
  Filled 2013-09-24: qty 1

## 2013-09-24 NOTE — [facility] Suicide Risk Assessment (Signed)
[facility] INPATIENT:  Family/Significant Other Suicide Prevention Education  Suicide Prevention Education:  Patient Refusal for Family/Significant Other Suicide Prevention Education: The patient Martin Chapman has refused to provide written consent for family/significant other to be provided Family/Significant Other Suicide Prevention Education during admission and/or prior to discharge.  Physician notified.  Wynn Banker 09/24/2013, 8:57 AM

## 2013-09-24 NOTE — Progress Notes (Signed)
Recreation Therapy Notes  Animal-Assisted Activity/Therapy (AAA/T) Program Checklist/Progress Notes Patient Eligibility Criteria Checklist & Daily Group note for Rec Tx Intervention  Date: 12.23.2014 Time: 2:45pm Location: [address]   AAA/T Program Assumption of Risk Form signed by Patient/ or Parent Legal Guardian yes  Patient is free of allergies or sever asthma yes  Patient reports no fear of animals yes  Patient reports no history of cruelty to animals yes   Patient understands his/her participation is voluntary yes  Patient washes hands before animal contact yes  Patient washes hands after animal contact yes  Behavioral Response: Appropriate   Education: Hand Washing, Appropriate Animal Interaction   Education Outcome: Acknowledges understanding  Clinical Observations/Feedback: Patient actively engaged in group session with therapeutic dog team.   Jearl Klinefelter, LRT/CTRS  Jearl Klinefelter 09/24/2013 4:16 PM
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
## 2013-09-24 NOTE — Progress Notes (Signed)
[facility] MD Progress Note  09/24/2013 2:03 PM Martin Chapman  MRN:  [id number]  Subjective:  Patient has been suffering with substance dependence especially heroin. Patient has been compliant with the inpatient hospital program and actively participated in milieu therapy. Patient has been craving for drugs of abuse especially tobacco. Patient stated that nicotine patch does not help him. Patient reported he was incarcerated several times for drug paraphernalia but stated that he does not want to quit using drugs at this time. Patient stated that he went to his mothers home to stay couple of days while the incident of sending picture to his girlfriend which was interpreted as a she suicide attempt. Patient stated his mother who is afraid of his life because he has been using needles for heroin. Patient stated that in trying to get high but not to kill myself. Patient mom was contacted with the case manager and he reportedly informed that she is afraid of her son's life.  patient stated that he would like to go to his residential substance abuse treatment at [facility] if possible. Patient contracts for safety while in the hospital.  Diagnosis:   DSM5: Schizophrenia Disorders:   Obsessive-Compulsive Disorders:   Trauma-Stressor Disorders:   Substance/Addictive Disorders:   Depressive Disorders:    Opiate dependence and substance-induced mood disorder  ADL's:  Intact  Sleep: Fair  Appetite:  Fair  Suicidal Ideation:  Patient contracts for safety in the hospital  Homicidal Ideation:  Denied  AEB (as evidenced by):  Psychiatric Specialty Exam: ROS  Blood pressure 105/66, pulse 76, temperature 98.2 F (36.8 C), temperature source Oral, resp. rate 18, height 5' 6.25" (1.683 m), weight 63.504 kg (140 lb).Body mass index is 22.42 kg/(m^2).  General Appearance: Guarded  Eye Contact::  Minimal  Speech:  Clear and Coherent and Pressured  Volume:  Normal  Mood:  Anxious, Depressed, Hopeless,  Irritable and Worthless  Affect:  Constricted and Depressed  Thought Process:  Goal Directed and Intact  Orientation:  Full (Time, Place, and Person)  Thought Content:  Rumination  Suicidal Thoughts:  Yes.  without intent/plan  Homicidal Thoughts:  No  Memory:  Immediate;   Fair  Judgement:  Impaired  Insight:  Lacking  Psychomotor Activity:  Restlessness  Concentration:  Fair  Recall:  Fair  Akathisia:  NA  Handed:  Right  AIMS (if indicated):     Assets:  Communication Skills Housing Leisure Time Physical Health Resilience Social Support  Sleep:  Number of Hours: 5.25   Current Medications: Current Facility-Administered Medications  Medication Dose Route Frequency Provider Last Rate Last Dose  . acetaminophen (TYLENOL) tablet 650 mg  650 mg Oral Q6H PRN Shuvon Rankin, NP   650 mg at 09/23/13 1815  . cloNIDine (CATAPRES) tablet 0.1 mg  0.1 mg Oral QID Shuvon Rankin, NP   0.1 mg at 09/24/13 1157   Followed by  . cloNIDine (CATAPRES) tablet 0.1 mg  0.1 mg Oral BH-qamhs Shuvon Rankin, NP       Followed by  . [START ON 09/27/2013] cloNIDine (CATAPRES) tablet 0.1 mg  0.1 mg Oral QAC breakfast Shuvon Rankin, NP      . dicyclomine (BENTYL) tablet 20 mg  20 mg Oral Q6H PRN Shuvon Rankin, NP   20 mg at 09/23/13 1527  . hydrOXYzine (ATARAX/VISTARIL) tablet 25 mg  25 mg Oral Q6H PRN Shuvon Rankin, NP   25 mg at 09/23/13 2209  . loperamide (IMODIUM) capsule 2-4 mg  2-4 mg Oral PRN Shuvon  Rankin, NP      . magnesium hydroxide (MILK OF MAGNESIA) suspension 30 mL  30 mL Oral Daily PRN Shuvon Rankin, NP      . methocarbamol (ROBAXIN) tablet 500 mg  500 mg Oral Q8H PRN Shuvon Rankin, NP   500 mg at 09/23/13 1416  . naproxen (NAPROSYN) tablet 500 mg  500 mg Oral BID PRN Shuvon Rankin, NP   500 mg at 09/23/13 0901  . nicotine polacrilex (NICORETTE) gum 2 mg  2 mg Oral PRN Nehemiah Settle, MD   2 mg at 09/23/13 1817  . ondansetron (ZOFRAN-ODT) disintegrating tablet 4 mg  4 mg Oral Q6H  PRN Shuvon Rankin, NP      . traZODone (DESYREL) tablet 50 mg  50 mg Oral QHS PRN,MR X 1 Kristeen Mans, NP   50 mg at 09/23/13 2210    Lab Results: No results found for this or any previous visit (from the past 48 hour(s)).  Physical Findings: AIMS: Facial and Oral Movements Muscles of Facial Expression: None, normal Lips and Perioral Area: None, normal Jaw: None, normal Tongue: None, normal,Extremity Movements Upper (arms, wrists, hands, fingers): None, normal Lower (legs, knees, ankles, toes): None, normal, Trunk Movements Neck, shoulders, hips: None, normal, Overall Severity Severity of abnormal movements (highest score from questions above): None, normal Incapacitation due to abnormal movements: None, normal Patient's awareness of abnormal movements (rate only patient's report): No Awareness, Dental Status Current problems with teeth and/or dentures?: No Does patient usually wear dentures?: No  CIWA:  CIWA-Ar Total: 1 COWS:  COWS Total Score: 4  Treatment Plan Summary: Daily contact with patient to assess and evaluate symptoms and progress in treatment Medication management  Plan: Treatment Plan/Recommendations:   1. Admit for crisis management and stabilization. 2. Medication management to reduce current symptoms to base line and improve the patient's overall level of functioning. continue clonidine protocol as prescribed and supportive therapy  3. Treat health problems as indicated. 4. Develop treatment plan to decrease risk of relapse upon discharge and to reduce the need for readmission. 5. Psycho-social education regarding relapse prevention and self care. 6. Health care follow up as needed for medical problems. 7. Restart home medications where appropriate. 8. Disposition plans are in progress and may be discharged tomorrow then he can contract for safety and continued to show clinical improvement    Medical Decision Making Problem Points:  Established problem,  worsening (2), New problem, with no additional work-up planned (3), Review of last therapy session (1) and Review of psycho-social stressors (1) Data Points:  Review or order clinical lab tests (1) Review or order medicine tests (1) Review of medication regiment & side effects (2) Review of new medications or change in dosage (2)  I certify that inpatient services furnished can reasonably be expected to improve the patient's condition.    Dulcemaria Bula,JANARDHAHA R. 09/24/2013 2:09 PM

## 2013-09-24 NOTE — Progress Notes (Signed)
Adult Psychoeducational Group Note  Date:  09/24/2013 Time:  11:00am Group Topic/Focus:  Recovery Goals:   The focus of this group is to identify appropriate goals for recovery and establish a plan to achieve them.  Participation Level:  Did Not Attend  Participation Quality:    Affect:    Cognitive:   Insight:   Engagement in Group:    Modes of Intervention:    Additional Comments:  Pt did not attend group  Shelly Bombard D 09/24/2013, 1:43 PM

## 2013-09-24 NOTE — Progress Notes (Signed)
Patient ID: Martin Chapman, male   DOB: [birth year], 22 y.o.   MRN: [id number]  D:  Pt was pleasant, but slight anxious during the assessment. Continues to reiterate that he doesn't want to be here. Stating he's grown and can do drugs if he chooses. Stated that he started to have his mom committed "to take her freedom away".  Stated, "I'm grown, I can do what I want and I don't depend on her for nothing". Writer encouraged pt to rethink. Informed him that being grown is more than about using drugs. Reminded pt that he is homeless because he lives from home to home, and that he deserves more than that.   A:  Support and encouragement was offered. 15 min checks continued for safety.  R: Pt remains safe.

## 2013-09-24 NOTE — Clinical Social Work Note (Signed)
Writer spoke with patient's mother who continues to have concerns that patient will use drugs if he is discharged from the hospital.  She shared he has voiced SI in the past but has not made any SI statements to her while in the hospital.  She shared is threatening to have her committed so that she will understand how he feels by having him IVC'd.

## 2013-09-24 NOTE — Progress Notes (Signed)
[facility] MD Progress Note  09/23/2013 9:22 AM ROLLAND STEINERT  MRN:  [id number] Subjective:  Patient is seen and admission medical records was reviewed. Patient stated that he has been suffering with substance dependence especially heroin. Patient reported he has been lying to his mother about not using drugs and she found him in high drugs after he was broke up with his girlfriend and send pictures of drugs using like paraphernalia. Patient girlfriend contacted his mother and indicated he might overdose and kill himself. Patient reported he has been abusing drugs in over 5 years and does not work and he was not in school. Patient has [facility] [address], [facility] and [address]. Patient has been minimizing this emotional and substance abuse problems and denies being suicidal at this time.  Diagnosis:   DSM5: Schizophrenia Disorders:   Obsessive-Compulsive Disorders:   Trauma-Stressor Disorders:   Substance/Addictive Disorders:   Depressive Disorders:    Opiate dependence and substance-induced mood disorder  ADL's:  Intact  Sleep: Fair  Appetite:  Fair  Suicidal Ideation:  Patient contracts for safety in the hospital  Homicidal Ideation:  Denied  AEB (as evidenced by):  Psychiatric Specialty Exam: ROS  Blood pressure 93/58, pulse 82, temperature 97.7 F (36.5 C), temperature source Oral, resp. rate 18, height 5' 6.25" (1.683 m), weight 63.504 kg (140 lb).Body mass index is 22.42 kg/(m^2).  General Appearance: Guarded  Eye Contact::  Minimal  Speech:  Clear and Coherent and Pressured  Volume:  Normal  Mood:  Anxious, Depressed, Hopeless, Irritable and Worthless  Affect:  Constricted and Depressed  Thought Process:  Goal Directed and Intact  Orientation:  Full (Time, Place, and Person)  Thought Content:  Rumination  Suicidal Thoughts:  Yes.  without intent/plan  Homicidal Thoughts:  No  Memory:  Immediate;   Fair  Judgement:   Impaired  Insight:  Lacking  Psychomotor Activity:  Restlessness  Concentration:  Fair  Recall:  Fair  Akathisia:  NA  Handed:  Right  AIMS (if indicated):     Assets:  Communication Skills Housing Leisure Time Physical Health Resilience Social Support  Sleep:  Number of Hours: 5.25   Current Medications: Current Facility-Administered Medications  Medication Dose Route Frequency Provider Last Rate Last Dose  . acetaminophen (TYLENOL) tablet 650 mg  650 mg Oral Q6H PRN Shuvon Rankin, NP   650 mg at 09/23/13 1815  . cloNIDine (CATAPRES) tablet 0.1 mg  0.1 mg Oral QID Shuvon Rankin, NP   0.1 mg at 09/24/13 0841   Followed by  . cloNIDine (CATAPRES) tablet 0.1 mg  0.1 mg Oral BH-qamhs Shuvon Rankin, NP       Followed by  . [START ON 09/27/2013] cloNIDine (CATAPRES) tablet 0.1 mg  0.1 mg Oral QAC breakfast Shuvon Rankin, NP      . dicyclomine (BENTYL) tablet 20 mg  20 mg Oral Q6H PRN Shuvon Rankin, NP   20 mg at 09/23/13 1527  . hydrOXYzine (ATARAX/VISTARIL) tablet 25 mg  25 mg Oral Q6H PRN Shuvon Rankin, NP   25 mg at 09/23/13 2209  . loperamide (IMODIUM) capsule 2-4 mg  2-4 mg Oral PRN Shuvon Rankin, NP      . magnesium hydroxide (MILK OF MAGNESIA) suspension 30 mL  30 mL Oral Daily PRN Shuvon Rankin, NP      . methocarbamol (ROBAXIN) tablet 500 mg  500 mg Oral Q8H PRN Shuvon Rankin, NP   500 mg at 09/23/13 1416  . naproxen (  NAPROSYN) tablet 500 mg  500 mg Oral BID PRN Shuvon Rankin, NP   500 mg at 09/23/13 0901  . nicotine polacrilex (NICORETTE) gum 2 mg  2 mg Oral PRN Nehemiah Settle, MD   2 mg at 09/23/13 1817  . ondansetron (ZOFRAN-ODT) disintegrating tablet 4 mg  4 mg Oral Q6H PRN Shuvon Rankin, NP      . traZODone (DESYREL) tablet 50 mg  50 mg Oral QHS PRN,MR X 1 Kristeen Mans, NP   50 mg at 09/23/13 2210    Lab Results: No results found for this or any previous visit (from the past 48 hour(s)).  Physical Findings: AIMS: Facial and Oral Movements Muscles of Facial  Expression: None, normal Lips and Perioral Area: None, normal Jaw: None, normal Tongue: None, normal,Extremity Movements Upper (arms, wrists, hands, fingers): None, normal Lower (legs, knees, ankles, toes): None, normal, Trunk Movements Neck, shoulders, hips: None, normal, Overall Severity Severity of abnormal movements (highest score from questions above): None, normal Incapacitation due to abnormal movements: None, normal Patient's awareness of abnormal movements (rate only patient's report): No Awareness, Dental Status Current problems with teeth and/or dentures?: No Does patient usually wear dentures?: No  CIWA:  CIWA-Ar Total: 1 COWS:  COWS Total Score: 4  Treatment Plan Summary: Daily contact with patient to assess and evaluate symptoms and progress in treatment Medication management  Plan: Treatment Plan/Recommendations:   1. Admit for crisis management and stabilization. 2. Medication management to reduce current symptoms to base line and improve the patient's overall level of functioning. 3. Treat health problems as indicated. 4. Develop treatment plan to decrease risk of relapse upon discharge and to reduce the need for readmission. 5. Psycho-social education regarding relapse prevention and self care. 6. Health care follow up as needed for medical problems. 7. Restart home medications where appropriate.   Medical Decision Making Problem Points:  Established problem, worsening (2), New problem, with no additional work-up planned (3), Review of last therapy session (1) and Review of psycho-social stressors (1) Data Points:  Review or order clinical lab tests (1) Review or order medicine tests (1) Review of medication regiment & side effects (2) Review of new medications or change in dosage (2)  I certify that inpatient services furnished can reasonably be expected to improve the patient's condition.   Taimi Towe,JANARDHAHA R. 12/222014, 9:22 AM

## 2013-09-24 NOTE — [facility] Suicide Risk Assessment (Signed)
[facility] INPATIENT:  Family/Significant Other Suicide Prevention Education  Suicide Prevention Education:  Education Completed; Buena Irish, Mother, [phone] has been identified by the patient as the family member/significant other with whom the patient will be residing, and identified as the person(s) who will aid the patient in the event of a mental health crisis (suicidal ideations/suicide attempt).  With written consent from the patient, the family member/significant other has been provided the following suicide prevention education, prior to the and/or following the discharge of the patient.  The suicide prevention education provided includes the following:  Suicide risk factors  Suicide prevention and interventions  National Suicide Hotline telephone number  [facility] assessment telephone number  [facility] Emergency Assistance 911  [facility] and/or Residential Mobile Crisis Unit telephone number  Request made of family/significant other to:  Remove weapons (e.g., guns, rifles, knives), all items previously/currently identified as safety concern.  Mother advised she is not awasre of patient having access to guns.  Remove drugs/medications (over-the-counter, prescriptions, illicit drugs), all items previously/currently identified as a safety concern.  The family member/significant other verbalizes understanding of the suicide prevention education information provided.  The family member/significant other agrees to remove the items of safety concern listed above.  Wynn Banker 09/24/2013, 10:35 AM

## 2013-09-24 NOTE — Progress Notes (Signed)
D:  Patient's self inventory sheet, patient has poor sleep, good appetite, low energy level, poor attention span.  Denied depression, hopeless, anxiety.  Stated he has felt achy in pat 24 hours.  Denied SI.  Ready for discharge.  Plans to return home after discharge.  Can afford his meds. A:  Medications administered per MD orders.  Emotional support and encouragement given patient. R:  Denied SI and HI.  Denied A/V hallucinations.  Denied pain.  Will continue 15 minute checks for safety.  Safety maintained.

## 2013-09-24 NOTE — Progress Notes (Signed)
The focus of this group is to educate the patient on the purpose and policies of crisis stabilization and provide a format to answer questions about their admission.  The group details unit policies and expectations of patients while admitted.  Patient did not attend 0900 nurse education orientation group this morning.  Patient stayed in bed.   

## 2013-09-25 MED ORDER — HYDROXYZINE HCL 25 MG PO TABS: 25.0000 mg | ORAL_TABLET | Freq: Four times a day (QID) | ORAL | Status: DC | PRN

## 2013-09-25 MED ORDER — TRAZODONE HCL 100 MG PO TABS: 100.0000 mg | ORAL_TABLET | Freq: Every evening | ORAL | Status: DC | PRN

## 2013-09-25 NOTE — [facility] Suicide Risk Assessment (Signed)
Suicide Risk Assessment  Discharge Assessment     Demographic Factors:  Male, Adolescent or young adult, Caucasian, Low socioeconomic status and Unemployed  Mental Status Per Nursing Assessment::   On Admission:     Current Mental Status by Physician: Mental Status Examination: Patient appeared as per his stated age, casually dressed, and fairly groomed, and maintaining good eye contact. Patient has good mood and his affect was constricted. He has normal rate, rhythm, and volume of speech. His thought process is linear and goal directed. Patient has denied suicidal, homicidal ideations, intentions or plans. Patient has no evidence of auditory or visual hallucinations, delusions, and paranoia. Patient has fair insight judgment and impulse control.  Loss Factors: Financial problems/change in socioeconomic status  Historical Factors: Prior suicide attempts, Family history of mental illness or substance abuse and Impulsivity  Risk Reduction Factors:   Sense of responsibility to family, Religious beliefs about death, Living with another person, especially a relative, Positive social support, Positive therapeutic relationship and Positive coping skills or problem solving skills  Continued Clinical Symptoms:  Depression:   Recent sense of peace/wellbeing Alcohol/Substance Abuse/Dependencies Unstable or Poor Therapeutic Relationship Previous Psychiatric Diagnoses and Treatments  Cognitive Features That Contribute To Risk:  Polarized thinking    Suicide Risk:  Minimal: No identifiable suicidal ideation.  Patients presenting with no risk factors but with morbid ruminations; may be classified as minimal risk based on the severity of the depressive symptoms  Discharge Diagnoses:   AXIS I:  Substance Induced Mood Disorder and Opiate dependence AXIS II:  Deferred AXIS III:   Past Medical History  Diagnosis Date  . Anxiety   . Panic attacks   . Depression   . Polysubstance abuse   .  Opiate abuse, continuous    AXIS IV:  economic problems, housing problems, occupational problems, other psychosocial or environmental problems, problems related to social environment and problems with primary support group AXIS V:  61-70 mild symptoms  Plan Of Care/Follow-up recommendations:  Activity:  As tolerated Diet:  Regular  Is patient on multiple antipsychotic therapies at discharge:  No   Has Patient had three or more failed trials of antipsychotic monotherapy by history:  No  Recommended Plan for Multiple Antipsychotic Therapies: NA  Martin Chapman,Martin R. 09/25/2013, 12:23 PM

## 2013-09-25 NOTE — Discharge Summary (Signed)
Physician Discharge Summary Note  Patient:  Martin Chapman is an 22 y.o., male MRN:  [id number] DOB:  08-10-1991 Patient phone:  [phone] (home)  Patient address:   [address] [place] [address],   Date of Admission:  09/22/2013 Date of Discharge: 09/25/2013  Reason for Admission:  Depression, suicidal, opiate dependency  Discharge Diagnoses: Active Problems:   Drug overdose, multiple drugs   MDD (major depressive disorder)   Heroin dependence   Suicide attempt  Review of Systems  Constitutional: Negative.   HENT: Negative.   Eyes: Negative.   Respiratory: Negative.   Cardiovascular: Negative.   Gastrointestinal: Negative.   Genitourinary: Negative.   Musculoskeletal: Negative.   Skin: Negative.   Neurological: Negative.   Endo/Heme/Allergies: Negative.   Psychiatric/Behavioral: Positive for substance abuse. The patient is nervous/anxious.     DSM5:  Substance/Addictive Disorders:  Opioid Disorder - Severe (304.00) Depressive Disorders:  Major Depressive Disorder - Severe (296.23)  Axis Diagnosis:   AXIS I:  Anxiety Disorder NOS, Major Depression, Recurrent severe, Substance Abuse and Substance Induced Mood Disorder AXIS II:  Deferred AXIS III:   Past Medical History  Diagnosis Date  . Anxiety   . Panic attacks   . Depression   . Polysubstance abuse   . Opiate abuse, continuous    AXIS IV:  other psychosocial or environmental problems, problems related to social environment and problems with primary support group AXIS V:  61-70 mild symptoms  Level of Care:  OP  Hospital Course:  On admission:  Patient earlier in the evening had taken a picture of his heroin, needle & spoon and sent it to the girlfriend that he broke up with a week ago. Patient said that ex girlfriend interpreted it as a suicide threat. He meant it to be that he was going to use drugs instead of being around her. Ex girlfriend called mother and said that she thought he  was trying to overdose. When mother knocked on his door at the home and entered, he looked knocked out. He had just injected and she thought that he was trying to kill himself so she called EMS. EMS brought him to [facility]. Patient was seen by Cathrine (PA) then left AMA. In the meantime mother got IVC papers out on patient. He was brought back to [facility]. Patient denies steadfastly any intention, plan or desire to kill himself. Patient says "I was trying to get high." He denies any previous attempt to kill self. He did go to [facility] in June '14. He says that he lied about being suicidal to them so that he could get detox services for his heroin addiction. He has been to [facility] before but says that was a few years ago. Patient admits to some depression about breakup with girlfriend. He says that he has talked about "wishing he were dead" but has no intention to kill self he says. Patient also denies any HI or A/V hallucinations. He has been using between 1/2gram to a full gram for the last month of heroin. Last use was 12/20 around 20:00. Patient also uses marijuana on a regular basis. Detox service was offered to patient but he declined tx currently. He did ask for suboxone tx information and this was provided. Adamantly denies suicide attempt or past attempts despite documentation stating otherwise.  During hospitalization:  Medications managed--Clonidine opiate detox utilized successfully.  Trazodone 100 mg at bedtime for sleep issues and Vistartil 25 mg every six hours PRN anxiety started.  Raequon attended and participated in therapy.  He denied suicidal/homicidal ideations and auditory/visual hallucinations, follow-up appointments encouraged to attend, outside support groups encouraged and information given, Rx given.  Slyvester is mentally and physically stable for discharge.  Consults:  None  Significant Diagnostic Studies:  labs: completed, reviewed, stable  Discharge Vitals:   Blood pressure 93/58,  pulse 80, temperature 97.6 F (36.4 C), temperature source Oral, resp. rate 18, height 5' 6.25" (1.683 m), weight 63.504 kg (140 lb). Body mass index is 22.42 kg/(m^2). Lab Results:   No results found for this or any previous visit (from the past 72 hour(s)).  Physical Findings: AIMS: Facial and Oral Movements Muscles of Facial Expression: None, normal Lips and Perioral Area: None, normal Jaw: None, normal Tongue: None, normal,Extremity Movements Upper (arms, wrists, hands, fingers): None, normal Lower (legs, knees, ankles, toes): None, normal, Trunk Movements Neck, shoulders, hips: None, normal, Overall Severity Severity of abnormal movements (highest score from questions above): None, normal Incapacitation due to abnormal movements: None, normal Patient's awareness of abnormal movements (rate only patient's report): No Awareness, Dental Status Current problems with teeth and/or dentures?: No Does patient usually wear dentures?: No  CIWA:  CIWA-Ar Total: 1 COWS:  COWS Total Score: 6  Psychiatric Specialty Exam: See Psychiatric Specialty Exam and Suicide Risk Assessment completed by Attending Physician prior to discharge.  Discharge destination:  Home  Is patient on multiple antipsychotic therapies at discharge:  No   Has Patient had three or more failed trials of antipsychotic monotherapy by history:  No  Recommended Plan for Multiple Antipsychotic Therapies: NA     Medication List    Notice   You have not been prescribed any medications.         Follow-up Information   Follow up with [facility] On 09/30/2013. (Please go to [facility] walk in clinic on Monday, September 30, 2013 or any weekday between 8AM -12Noon for outpatient services.)    Contact information:   [address] [place], [place]   [address]  [phone]      Follow-up recommendations:  Activity:  as tolerated Diet:  low-sodium heart healthy diet  Comments:  Take all your  medications as prescribed by your mental healthcare provider. Report any adverse effects and or reactions from your medicines to your outpatient provider promptly. Patient is instructed and cautioned to not engage in alcohol and or illegal drug use while on prescription medicines. In the event of worsening symptoms, patient is instructed to call the crisis hotline, 911 and or go to the nearest ED for appropriate evaluation and treatment of symptoms. Follow-up with your primary care provider for your other medical issues, concerns and or health care needs.  Total Discharge Time:  Greater than 30 minutes.  SignedNanine Means, PMH-NP 09/25/2013, 11:27 AM  Patient was seen approximately for psychiatric evaluation, suicide risk assessment and case discussed with a physician extender and made disposition and plan. Reviewed the information documented and agree with the treatment plan.  Jaythan Hinely,JANARDHAHA R. 09/26/2013 11:40 AM

## 2013-09-25 NOTE — [facility] Group Notes (Signed)
[facility] LCSW Group Therapy      Feelings About Diagnosis 1:15 - 2:30 PM         09/25/2013  8:39 AM Late Entry for 09/24/13   Type of Therapy:  Group Therapy  Participation Level:  Active  Participation Quality:  Appropriate  Affect:  Appropriate  Cognitive:  Alert and Appropriate  Insight:  Developing/Improving and Engaged  Engagement in Therapy:  Developing/Improving and Engaged  Modes of Intervention:  Discussion, Education, Exploration, Problem-Solving, Rapport Building, Support  Summary of Progress/Problems:  Patient actively participated in group. Patient discussed past and present diagnosis and the effects it has had on  life.  Patient talked about family and society being judgmental and the stigma associated with having a mental health diagnosis. He shared having a diagnosis of SA causes him to feel that people do not him and they fear he will steal from them.  Patient continues to state he is not ready to give up his addiction.  Wynn Banker 09/25/2013  8:39 AM

## 2013-09-25 NOTE — Tx Team (Signed)
Interdisciplinary Treatment Plan Update   Date Reviewed:  09/25/2013  Time Reviewed:  9:58 AM  Progress in Treatment:   Attending groups: Yes Participating in groups: Yes Taking medication as prescribed: Yes  Tolerating medication: Yes Family/Significant other contact made: Yes, contact made with mother. Patient understands diagnosis: Yes  Discussing patient identified problems/goals with staff: Yes Medical problems stabilized or resolved: Yes Denies suicidal/homicidal ideation: Yes Patient has not harmed self or others: Yes  For review of initial/current patient goals, please see plan of care.  Estimated Length of Stay:  Discharge today  Reasons for Continued Hospitalization:   New Problems/Goals identified:    Discharge Plan or Barriers:   Home with outpatient at [facility]  Additional Comments:  N/A  Attendees:  Patient: Martin Chapman 09/25/2013 9:58 AM   Signature: Mervyn Gay, MD 09/25/2013 9:58 AM  Signature:  Robbie Louis, RN  09/25/2013 9:58 AM  Signature:  09/25/2013 9:58 AM  Signature: 09/25/2013 9:58 AM  Signature:   09/25/2013 9:58 AM  Signature:  Juline Patch, LCSW 09/25/2013 9:58 AM  Signature:   09/25/2013 9:58 AM  Signature:  09/25/2013 9:58 AM  Signature:   09/25/2013 9:58 AM  Signature: 09/25/2013  9:58 AM  Signature:   Onnie Boer, RN [facility] 09/25/2013  9:58 AM  Signature:   09/25/2013  9:58 AM    Scribe for Treatment Team:   Juline Patch,  09/25/2013 9:58 AM

## 2013-09-25 NOTE — Progress Notes (Signed)
Pt was discharged home today.  He denied any S/I H/I or A/V hallucinations.    He was given f/u appointment, rx, sample medications, and hotline info booklet.  He voiced understanding to all instructions provided.  He declined the need for smoking cessation materials.  

## 2013-09-25 NOTE — Progress Notes (Signed)
Psychoeducational Group Note  Date: 09/24/2013 Time: 2030  Group Topic/Focus:  Wrap-Up Group:   The focus of this group is to help patients review their daily goal of treatment and discuss progress on daily workbooks.  Participation Level: Did Not Attend  Participation Quality:  Not Applicable  Affect:  Not Applicable  Cognitive:  Not Applicable  Insight:  Not Applicable  Engagement in Group: Not Applicable  Additional Comments: Pt ws not in group this evening  Nolon Yellin A 09/25/2013, 10:38 AM

## 2013-09-25 NOTE — Progress Notes (Signed)
Adult Psychoeducational Group Note  Date:  09/25/2013 Time:  11:22 AM  Group Topic/Focus:  Personal Choices and Values:   The focus of this group is to help patients assess and explore the importance of values in their lives, how their values affect their decisions, how they express their values and what opposes their expression.  Participation Level:  Minimal  Participation Quality:  Appropriate  Affect:  Anxious  Cognitive:  Alert  Insight: Limited  Engagement in Group:  Limited  Modes of Intervention:  Discussion and Education  Additional Comments:  Pt attended but was in and out of group. Discussion was on recognizing depression.    Shelly Bombard D 09/25/2013, 11:22 AM

## 2013-09-25 NOTE — [facility] Group Notes (Signed)
[facility] LCSW Aftercare Discharge Planning Group Note   09/25/2013 10:40 AM    Participation Quality:  Appropraite  Mood/Affect:  Appropriate  Depression Rating:  1 1 Anxiety Rating:    Thoughts of Suicide:  No  Will you contract for safety?   NA  Current AVH:  No  Plan for Discharge/Comments:  Patient attended discharge planning group and actively participated in group.  Patient reports being ready to discharge today.  He is agreeable to follow up with [facility].  CSW provided all participants with daily workbook.   Transportation Means: Patient has transportation.   Supports:  Patient has a support system.   Danija Gosa, Joesph July

## 2013-09-30 NOTE — Progress Notes (Signed)
Patient Discharge Instructions:  After Visit Summary (AVS):   Faxed to:  09/30/13 Discharge Summary Note:   Faxed to:  09/30/13 Psychiatric Admission Assessment Note:   Faxed to:  09/30/13 Suicide Risk Assessment - Discharge Assessment:   Faxed to:  09/30/13 Faxed/Sent to the Next Level Care provider:  09/30/13 Faxed to [facility] of the [facility] @ [phone]  Jerelene Redden, 09/30/2013, 4:44 PM

## 2013-11-23 IMAGING — CR DG CHEST 1V PORT
1 series · 1 of 1 positions shown · non-contrast
Comparison: 04/08/2012

CLINICAL DATA: Drug overdose.

PORTABLE CHEST - 1 VIEW

[AP]
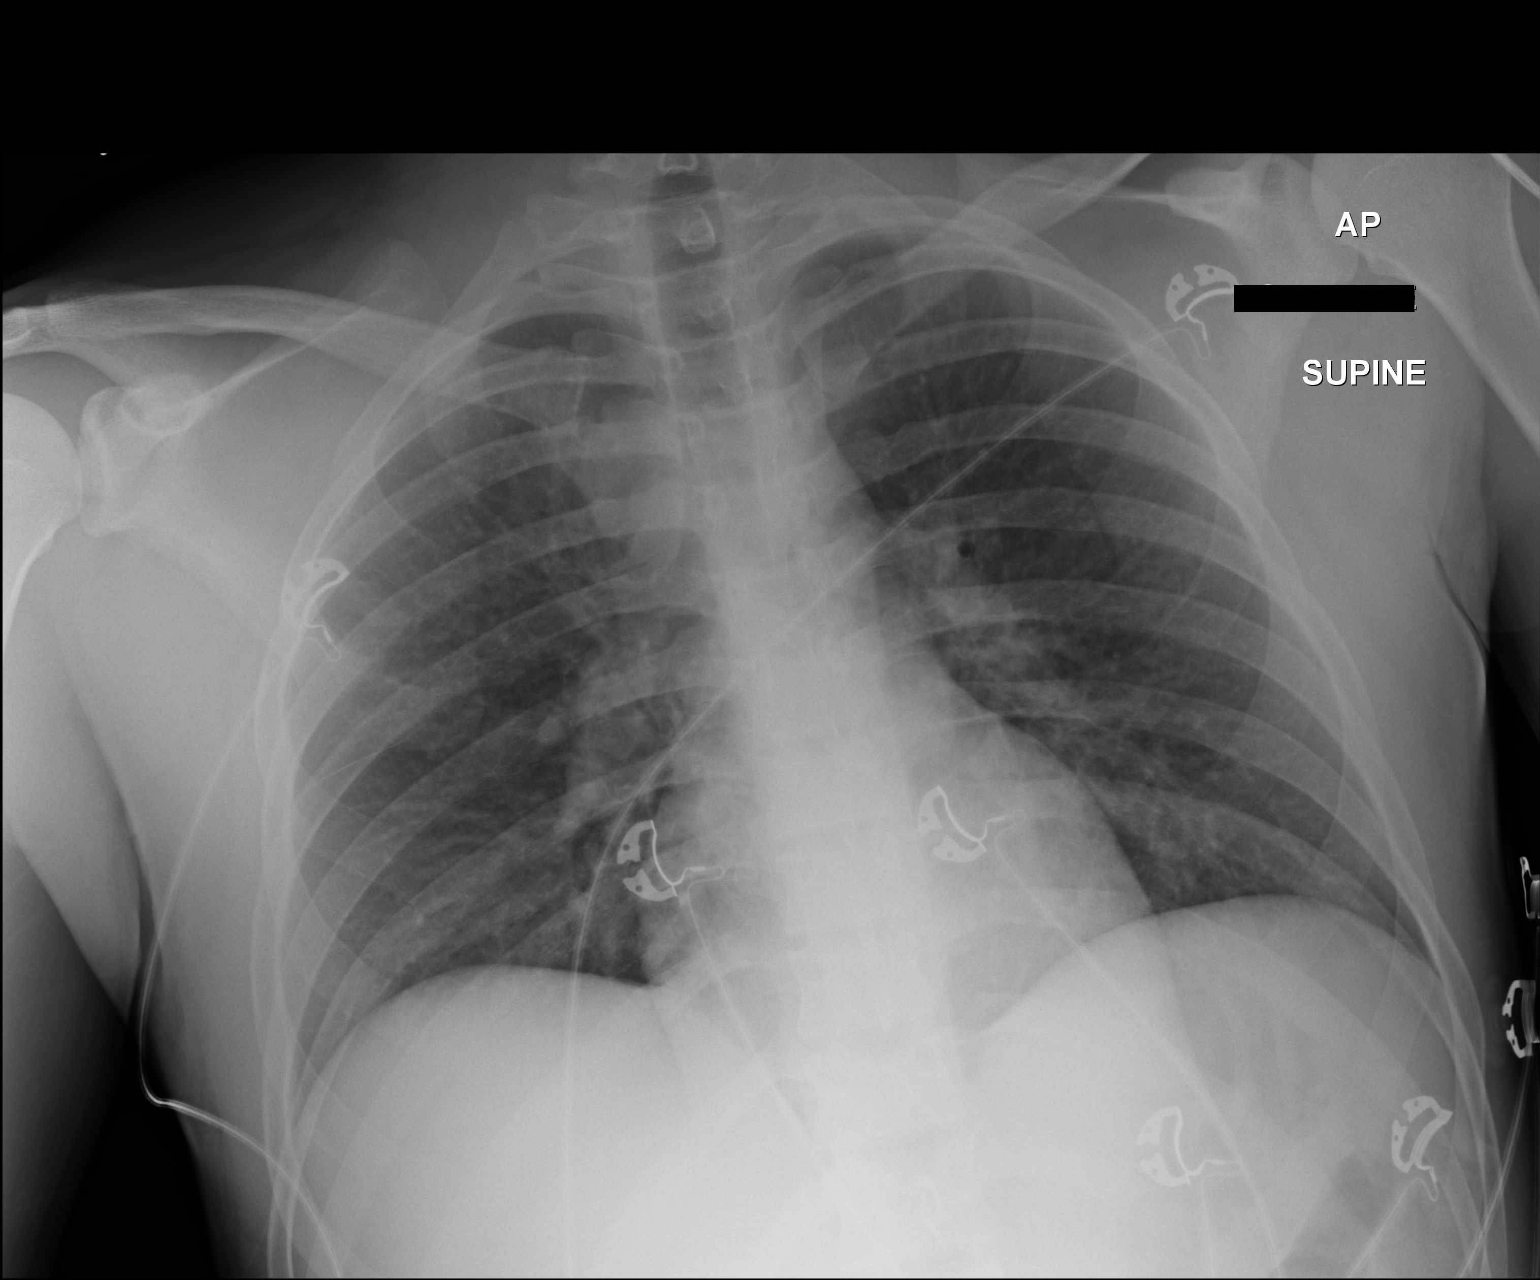

[1 of 1 positions shown; findings below may reference images not displayed]

FINDINGS: Heart size and pulmonary vascularity are normal and the
lungs are clear.  No osseous abnormality.
IMPRESSION: Normal chest.

## 2013-11-23 IMAGING — CR DG SHOULDER 2+V PORT*R*
2 series · 2 of 2 positions shown · non-contrast
Comparison: None.

CLINICAL DATA: Right shoulder pain.

PORTABLE RIGHT SHOULDER - 2+ VIEW

[AP (1 of 2)]
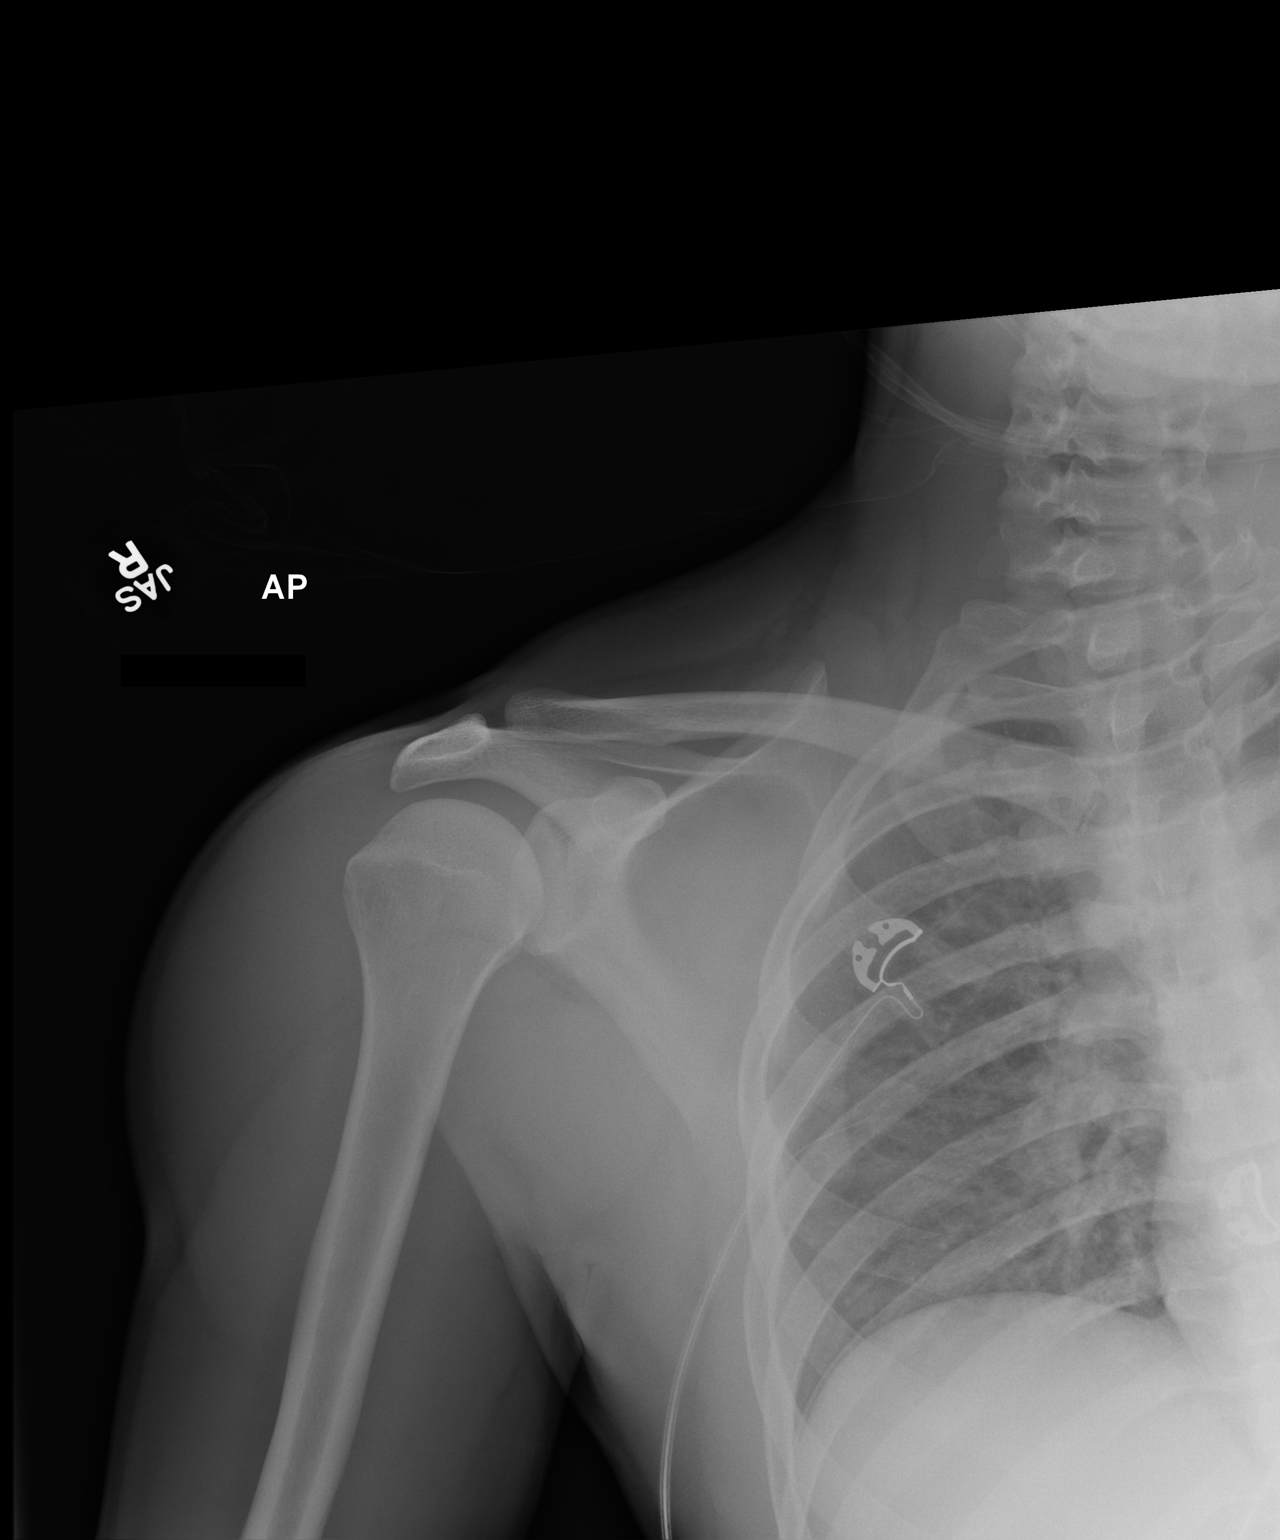

[AP (2 of 2)]
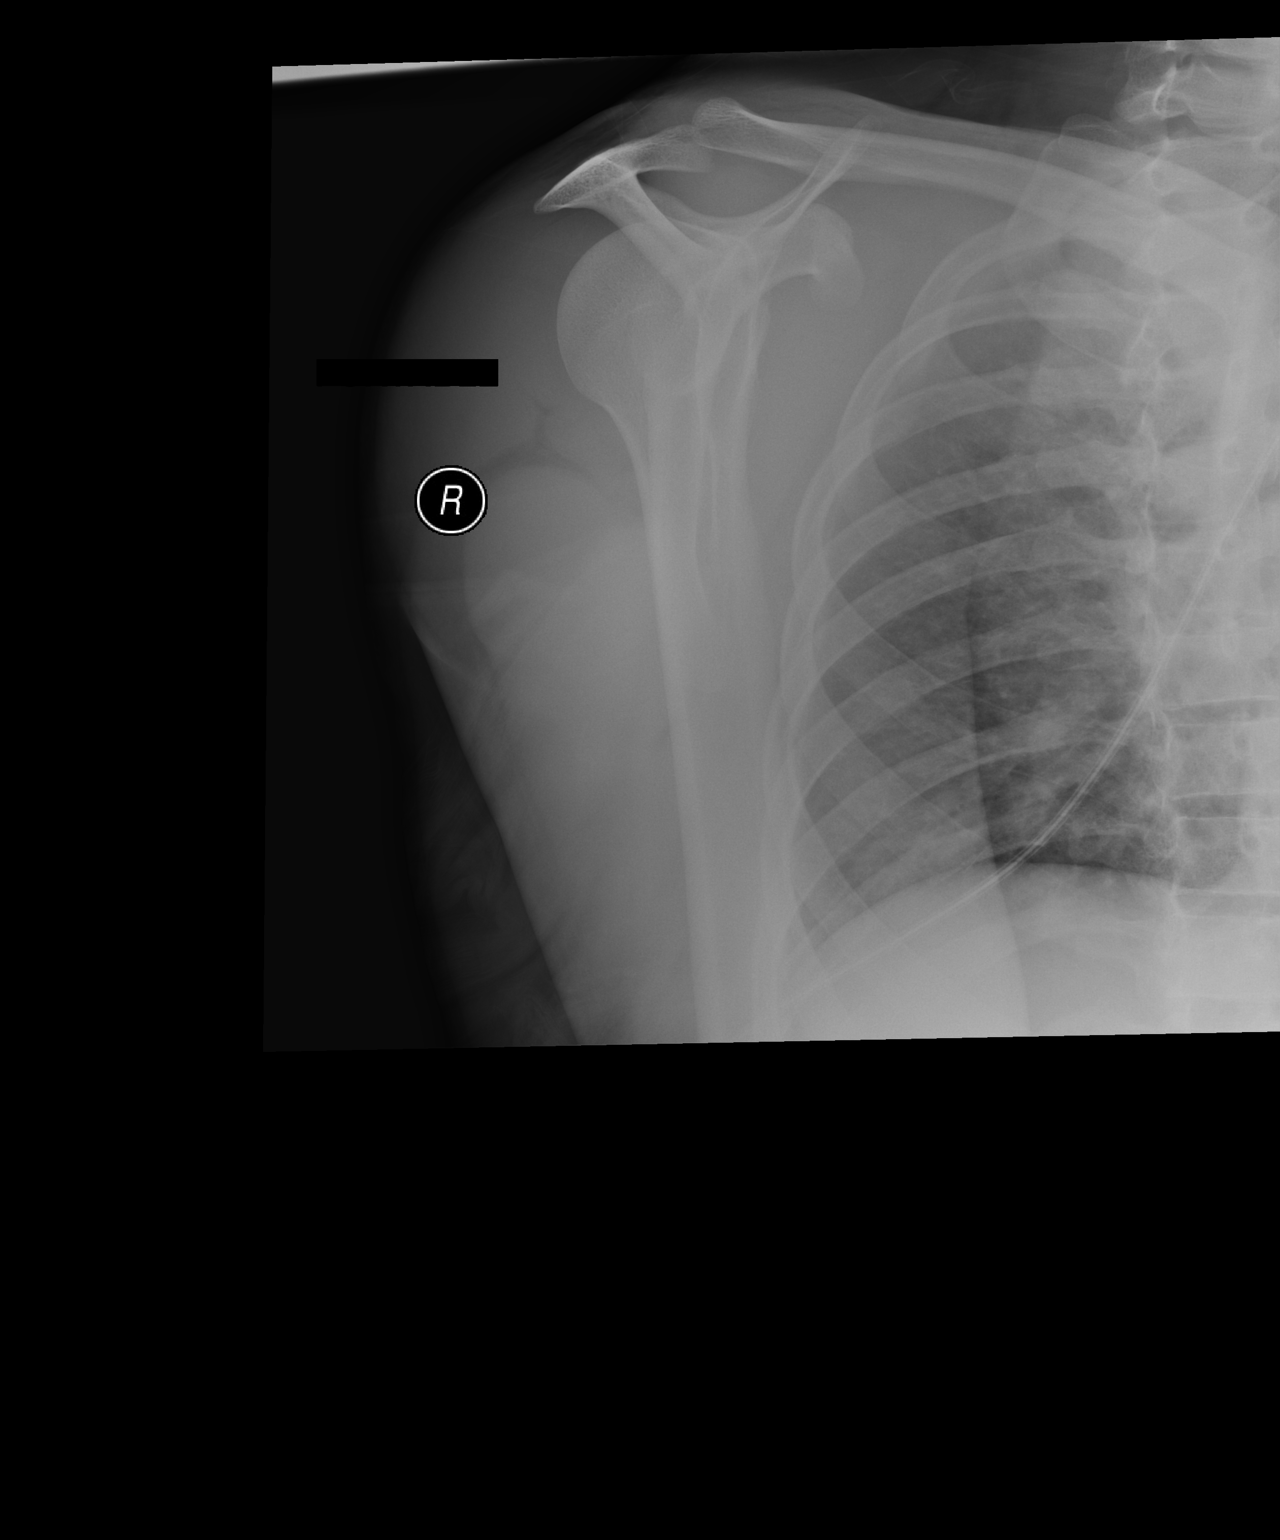

[2 of 2 positions shown; findings below may reference images not displayed]

FINDINGS: No fracture, dislocation, or other abnormality.
IMPRESSION: Normal exam.

## 2013-11-24 IMAGING — CR DG CHEST 1V PORT
1 series · 1 of 1 positions shown · non-contrast
Comparison: 07/22/2012

CLINICAL DATA: Respiratory failure and overdose.

PORTABLE CHEST - 1 VIEW

[AP]
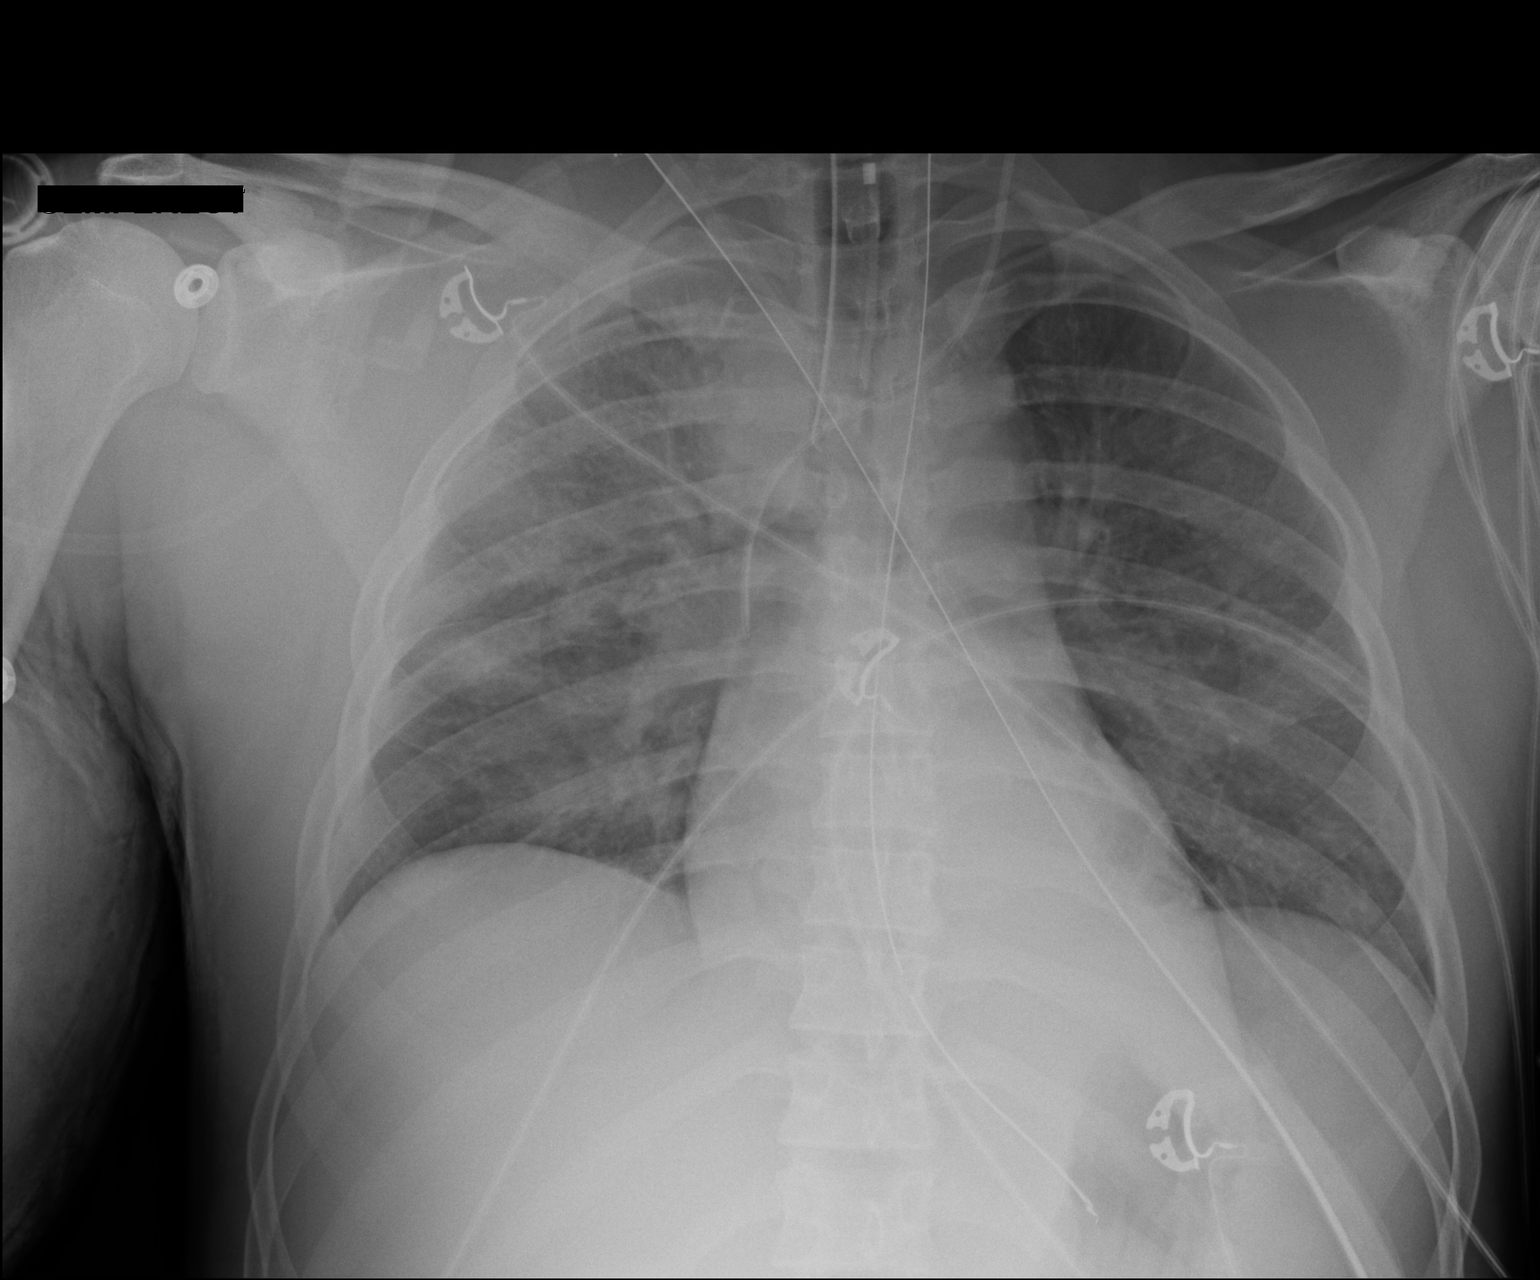

[1 of 1 positions shown; findings below may reference images not displayed]

FINDINGS: Since the prior study, the patient has been intubated
with endotracheal tube tip approximately 2 cm above the carina.
Left jugular central line tip lies in the lower SVC.  A nasogastric
tube extends into the proximal stomach.  Lung volumes are low with
interval development of mild pulmonary edema and potentially
additional infiltrate in the right lung.  No significant pleural
effusions are identified.  Heart size is within normal limits.
IMPRESSION: Development of mild pulmonary edema and potentially also infiltrate
of the right lung.

## 2013-11-26 IMAGING — CR DG CHEST 1V PORT
1 series · 1 of 1 positions shown · non-contrast
Comparison: Chest radiograph 07/24/2012

CLINICAL DATA: Intubated patient

PORTABLE CHEST - 1 VIEW

[AP]
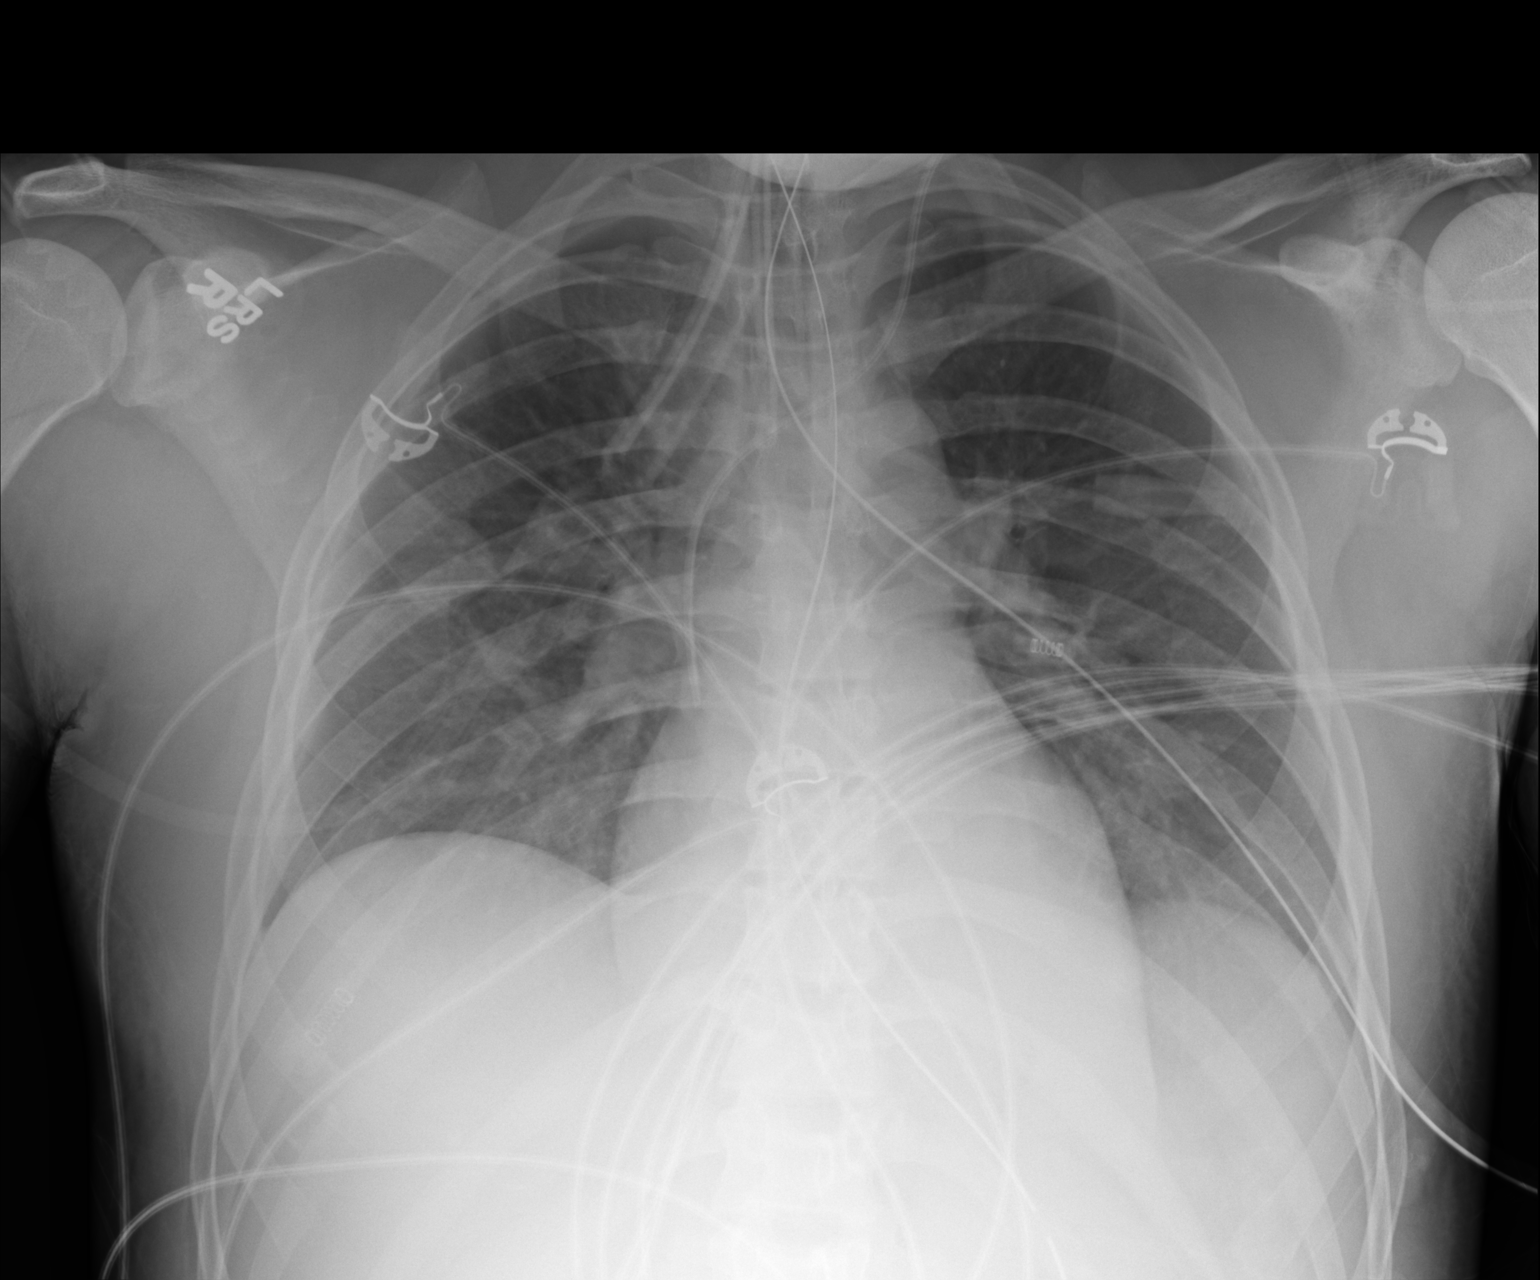

[1 of 1 positions shown; findings below may reference images not displayed]

FINDINGS: Endotracheal tube is 2.2 cm from carina.  Left-sided
central venous line is unchanged in position.  Stable cardiac
silhouette.  Lungs are clear.  No pneumothorax.
IMPRESSION: Endotracheal tube 2.2 cm from carina.

## 2013-11-27 IMAGING — CR DG CHEST 1V PORT
1 series · 1 of 1 positions shown · non-contrast
Comparison: Chest x-ray 07/25/2012.

CLINICAL DATA: Atelectasis.  Shortness of breath.

PORTABLE CHEST - 1 VIEW

[AP]
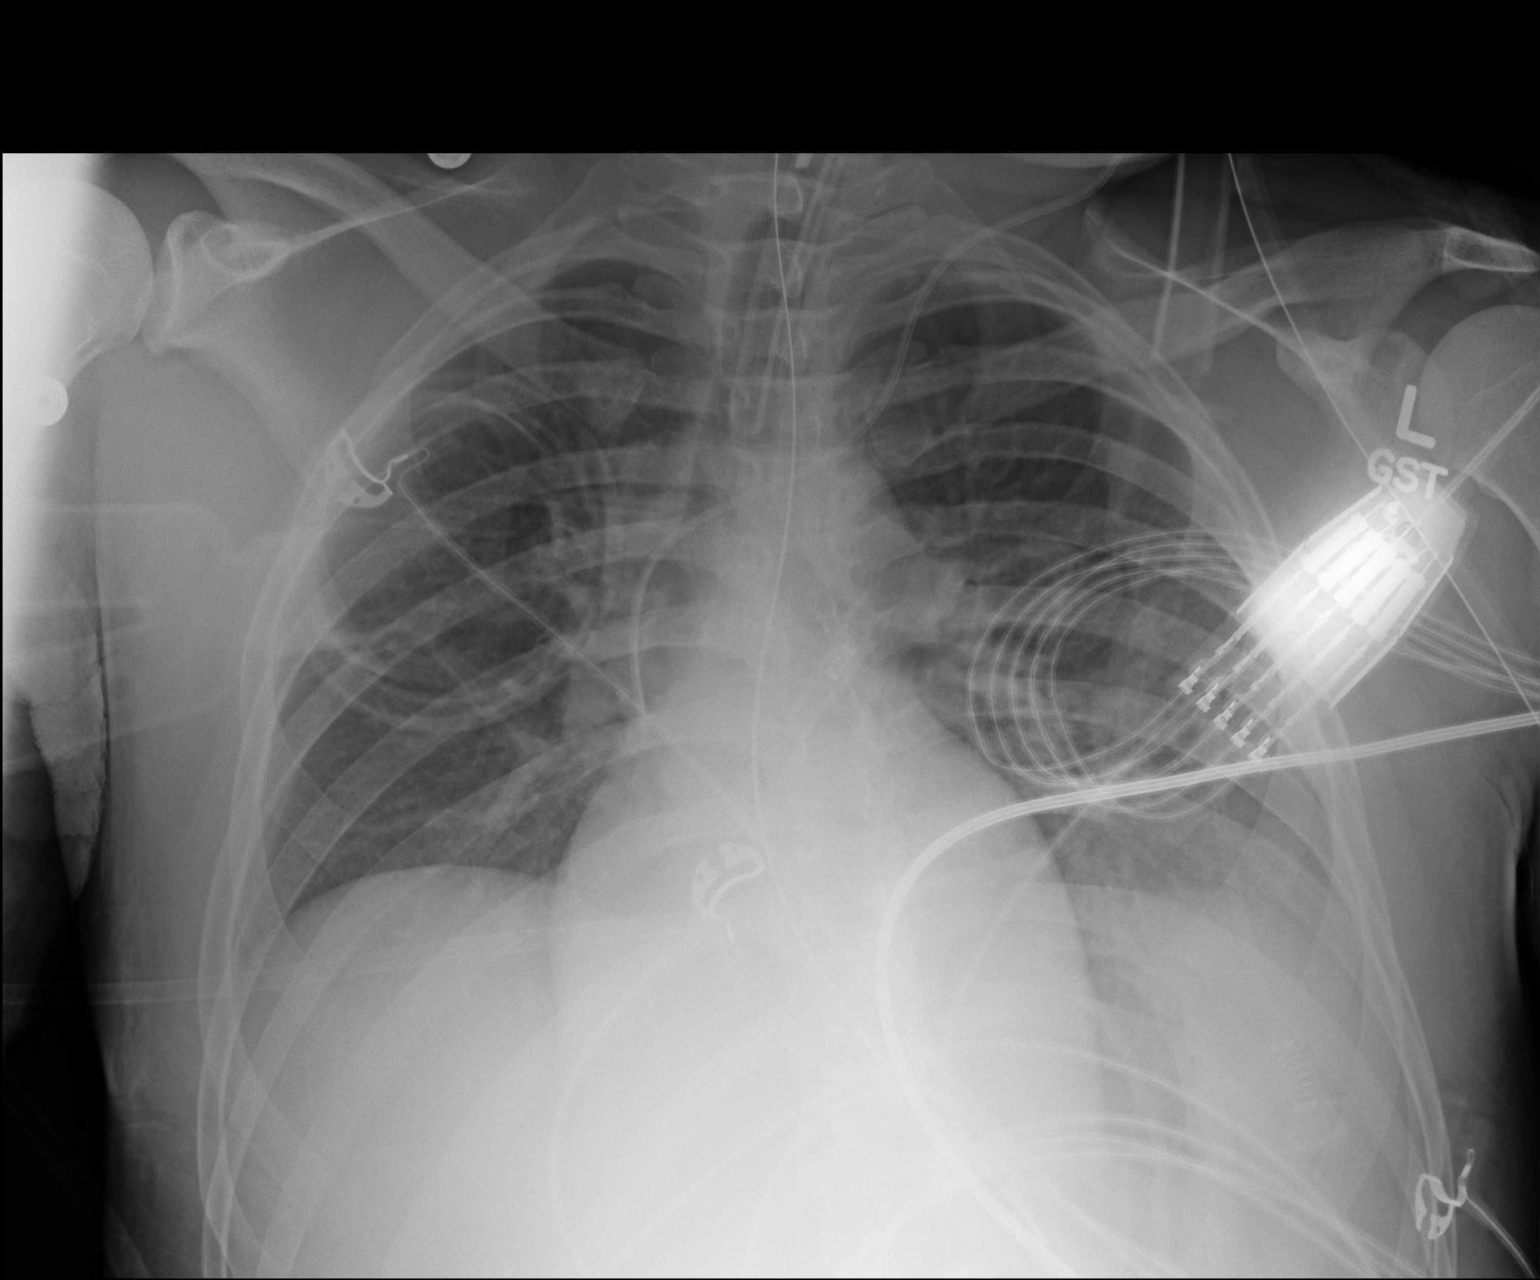

[1 of 1 positions shown; findings below may reference images not displayed]

FINDINGS: An endotracheal tube is in place with tip 3.2 cm above
the carina. There is a left-sided internal jugular central venous
catheter with tip terminating in the superior cavoatrial junction.
Lung volumes are low.  Bibasilar opacities (left greater than
right), favored to represent areas of subsegmental atelectasis.
Blunting of the left costophrenic sulcus favored to reflect a small
left pleural effusion.  Crowding of the pulmonary vasculature,
accentuated by low lung volumes.  Heart size is within normal
limits. The patient is rotated to the right on today's exam,
resulting in distortion of the mediastinal contours and reduced
diagnostic sensitivity and specificity for mediastinal pathology.
IMPRESSION: 1.  Support apparatus, as above.
2.  Low lung volumes with worsening bibasilar atelectasis and new
small left pleural effusion.

## 2014-06-13 ENCOUNTER — Encounter (HOSPITAL_BASED_OUTPATIENT_CLINIC_OR_DEPARTMENT_OTHER): Payer: Self-pay | Admitting: Emergency Medicine

## 2014-06-13 ENCOUNTER — Emergency Department (HOSPITAL_BASED_OUTPATIENT_CLINIC_OR_DEPARTMENT_OTHER)
Admission: EM | Admit: 2014-06-13 | Discharge: 2014-06-14 | Disposition: A | Discharge status: Home / Self Care | Payer: BC Managed Care – PPO | Attending: Emergency Medicine | Admitting: Emergency Medicine

## 2014-06-13 ENCOUNTER — Emergency Department (HOSPITAL_BASED_OUTPATIENT_CLINIC_OR_DEPARTMENT_OTHER): Payer: BC Managed Care – PPO

## 2014-06-13 DIAGNOSIS — F17200 Nicotine dependence, unspecified, uncomplicated: Secondary | ICD-10-CM | POA: Diagnosis not present

## 2014-06-13 DIAGNOSIS — F329 Major depressive disorder, single episode, unspecified: Secondary | ICD-10-CM | POA: Insufficient documentation

## 2014-06-13 DIAGNOSIS — X500XXA Overexertion from strenuous movement or load, initial encounter: Secondary | ICD-10-CM | POA: Insufficient documentation

## 2014-06-13 DIAGNOSIS — Y9389 Activity, other specified: Secondary | ICD-10-CM | POA: Insufficient documentation

## 2014-06-13 DIAGNOSIS — S8990XA Unspecified injury of unspecified lower leg, initial encounter: Secondary | ICD-10-CM | POA: Insufficient documentation

## 2014-06-13 DIAGNOSIS — F411 Generalized anxiety disorder: Secondary | ICD-10-CM | POA: Insufficient documentation

## 2014-06-13 DIAGNOSIS — S91119A Laceration without foreign body of unspecified toe without damage to nail, initial encounter: Secondary | ICD-10-CM

## 2014-06-13 DIAGNOSIS — Y9289 Other specified places as the place of occurrence of the external cause: Secondary | ICD-10-CM | POA: Diagnosis not present

## 2014-06-13 DIAGNOSIS — S99919A Unspecified injury of unspecified ankle, initial encounter: Secondary | ICD-10-CM

## 2014-06-13 DIAGNOSIS — S91109A Unspecified open wound of unspecified toe(s) without damage to nail, initial encounter: Secondary | ICD-10-CM | POA: Diagnosis not present

## 2014-06-13 DIAGNOSIS — F3289 Other specified depressive episodes: Secondary | ICD-10-CM | POA: Insufficient documentation

## 2014-06-13 DIAGNOSIS — S99929A Unspecified injury of unspecified foot, initial encounter: Secondary | ICD-10-CM

## 2014-06-13 MED ORDER — IBUPROFEN 800 MG PO TABS
800.0000 mg | ORAL_TABLET | Freq: Once | ORAL | Status: AC
  Administered 2014-06-13: 800 mg via ORAL
  Filled 2014-06-13: qty 1

## 2014-06-13 MED ORDER — CIPROFLOXACIN HCL 500 MG PO TABS
500.0000 mg | ORAL_TABLET | Freq: Once | ORAL | Status: AC
  Administered 2014-06-13: 500 mg via ORAL
  Filled 2014-06-13: qty 1

## 2014-06-13 MED ORDER — CIPROFLOXACIN HCL 500 MG PO TABS: 500.0000 mg | ORAL_TABLET | Freq: Two times a day (BID) | ORAL | Status: AC

## 2014-06-13 MED ORDER — IBUPROFEN 800 MG PO TABS: 800.0000 mg | ORAL_TABLET | Freq: Three times a day (TID) | ORAL | Status: AC

## 2014-06-13 NOTE — ED Provider Notes (Signed)
CSN: [id number]     Arrival date & time 06/13/14  2234 History  This chart was scribed for Aniyia Rane Smitty Cords, MD by Carl Best, ED Scribe. This patient was seen in room MH05/MH05 and the patient's care was started at 11:18 PM.      Chief Complaint  Patient presents with  . Toe Injury   Patient is a 23 y.o. male presenting with toe pain. The history is provided by the patient. No language interpreter was used.  Toe Pain This is a new problem. The current episode started 3 to 5 hours ago. The problem occurs constantly. The problem has not changed since onset.Pertinent negatives include no chest pain, no abdominal pain, no headaches and no shortness of breath. Nothing aggravates the symptoms. Nothing relieves the symptoms. He has tried nothing for the symptoms. The treatment provided no relief.   HPI Comments: Martin Chapman is a 23 y.o. male who presents to the Emergency Department complaining of a laceration of  his left great toenail and tip of toe he incurred 5 hours ago this evening after he tripped on cement in the woods.  He was wearing flip flops at the time of the incident.   Past Medical History  Diagnosis Date  . Anxiety   . Panic attacks   . Depression   . Polysubstance abuse   . Opiate abuse, continuous    Past Surgical History  Procedure Laterality Date  . Extraction of wisdom teeth    . Intraoperative arteriogram  07/22/2012    Procedure: INTRA OPERATIVE ARTERIOGRAM;  Surgeon: Sherren Kerns, MD;  Location: [facility] OR;  Service: Vascular;  Laterality: Left;  arch aortagram, second order catheterization right subclavian artery, ultrasound left groin  . Insertion of dialysis catheter  07/22/2012    Procedure: INSERTION OF DIALYSIS CATHETER;  Surgeon: Sherren Kerns, MD;  Location: [facility] OR;  Service: Vascular;  Laterality: Right;  . Fasciotomy  07/22/2012    Procedure: FASCIOTOMY;  Surgeon: Nadara Mustard, MD;  Location: [facility] OR;  Service: Orthopedics;  Laterality:  Right;  . I&d extremity  07/24/2012    Procedure: IRRIGATION AND DEBRIDEMENT EXTREMITY;  Surgeon: Nadara Mustard, MD;  Location: [facility] OR;  Service: Orthopedics;  Laterality: Right;  Right Arm Irrigation and Debridement, VAC Application  . Application of wound vac  07/24/2012    Procedure: APPLICATION OF WOUND VAC;  Surgeon: Nadara Mustard, MD;  Location: [facility] OR;  Service: Orthopedics;  Laterality: Right;  . Insertion of dialysis catheter  07/31/2012    Procedure: INSERTION OF DIALYSIS CATHETER;  Surgeon: Nada Libman, MD;  Location: [facility] OR;  Service: Vascular;  Laterality: Left;  internal jugular   Family History  Problem Relation Age of Onset  . Hypertension Father    History  Substance Use Topics  . Smoking status: Current Every Day Smoker -- 1.00 packs/day for 10 years    Types: Cigarettes  . Smokeless tobacco: Not on file  . Alcohol Use: Yes    Review of Systems  Respiratory: Negative for shortness of breath.   Cardiovascular: Negative for chest pain.  Gastrointestinal: Negative for abdominal pain.  Skin: Positive for wound.  Neurological: Negative for headaches.  All other systems reviewed and are negative.     Allergies  Review of patient's allergies indicates no known allergies.  Home Medications   Prior to Admission medications   Medication Sig Start Date End Date Taking? Authorizing Provider  hydrOXYzine (ATARAX/VISTARIL) 25 MG tablet Take 1 tablet (  25 mg total) by mouth every 6 (six) hours as needed for anxiety. 09/25/13   Nanine Means, NP  traZODone (DESYREL) 100 MG tablet Take 1 tablet (100 mg total) by mouth at bedtime as needed and may repeat dose one time if needed for sleep. 09/25/13   Nanine Means, NP   BP 135/103  Pulse 92  Temp(Src) 98.7 F (37.1 C) (Oral)  Resp 18  Ht  (1.702 m)  Wt 155 lb (70.308 kg)  BMI 24.27 kg/m2  SpO2 99%  Physical Exam  Nursing note and vitals reviewed. Constitutional: He is oriented to person, place, and time. He  appears well-developed and well-nourished.  HENT:  Head: Normocephalic and atraumatic.  Mouth/Throat: Oropharynx is clear and moist. No oropharyngeal exudate.  Eyes: EOM are normal. Pupils are equal, round, and reactive to light.  Neck: Normal range of motion. Neck supple.  Cardiovascular: Normal rate, regular rhythm and normal heart sounds.   No murmur heard. Pulmonary/Chest: Effort normal and breath sounds normal. No respiratory distress. He has no wheezes. He has no rales.  Abdominal: Soft. There is no tenderness. There is no rebound.  Musculoskeletal: Normal range of motion.  Neurological: He is alert and oriented to person, place, and time. He has normal reflexes.  Skin: Skin is warm and dry.  Tiny u-shaped laceration at the top of the left greater toe.  Psychiatric: He has a normal mood and affect. His behavior is normal.    ED Course  Procedures (including critical care time)  DIAGNOSTIC STUDIES: Oxygen Saturation is 99% on room air, normal by my interpretation.    COORDINATION OF CARE: 11:20 PM- Will obtain an x-ray of the patient's left foot and Dermabond the greater toenail.  Will discharge the patient with antibiotics.  The patient agreed to the treatment plan. 11:50 PM- Cleaned left greater toenail with saline solution and applied Dermabond to the patient's left greater toenail.  Will discharge with a 7 day course of Ciprofloxacin.  The patient agreed to the treatment plan.   Labs Review Labs Reviewed - No data to display  Imaging Review No results found.   EKG Interpretation None      MDM   Final diagnoses:  None    Ice elevation and NSAIDS, start cipro due to dirty wound with flip flops.  Return for redness drainage streaking or any concerns  LACERATION REPAIR Performed by: Jasmine Awe Authorized by: Jasmine Awe Consent: Verbal consent obtained. Risks and benefits: risks, benefits and alternatives were discussed Consent given by:  patient Patient identity confirmed: provided demographic data Prepped and Draped in normal sterile fashion Wound explored  Laceration Location: tip of great toe   Laceration Length: 1 cm  No Foreign Bodies seen or palpated  Irrigation method: syringe Amount of cleaning: extensive   Skin closure: dermabond   Technique: dermabond  Patient tolerance: Patient tolerated the procedure well with no immediate complications.  I personally performed the services described in this documentation, which was scribed in my presence. The recorded information has been reviewed and is accurate.     Jasmine Awe, MD 06/14/14 [phone]

## 2014-06-13 NOTE — ED Notes (Signed)
He slipped while wearing flip flops and split his left great toenail. Dried blood on his toe.

## 2014-06-13 NOTE — Discharge Instructions (Signed)
Fingernail or Toenail Loss All or part of your fingernail or toenail has been lost. This may or may not grow back as a normal nail. A special non-stick bandage has been put on your finger or toe tightly to prevent bleeding. HOME CARE INSTRUCTIONS  The tips of fingers and toes are full of nerves and injuries are often very painful. The following will help you decrease the pain and obtain the best outcome.  Keep your hand or foot elevated above your heart to relieve pain and swelling. This will require lying in bed or on a couch with the hand or leg on pillows or sitting in a recliner with the leg up. Letting your hand or leg dangle may increase swelling, slow healing and cause throbbing pain.  Keep your dressing dry and clean.  Change your bandage in 24 hours after going home.  After your bandage is changed, soak your hand or foot in warm soapy water for 10 to 20 minutes. Do this 3 times per day. This helps reduce pain and swelling. After soaking, apply a clean, dry bandage. Change your bandage if it is wet or dirty.  Only take over-the-counter or prescription medicines for pain, discomfort, or fever as directed by your caregiver.  See your caregiver as needed for problems. SEEK IMMEDIATE MEDICAL CARE IF:   You have increased pain, swelling, drainage, or bleeding.  You have a fever. MAKE SURE YOU:   Understand these instructions.  Will watch your condition.  Will get help right away if you are not doing well or get worse. Document Released: 08/11/2006 Document Revised: 12/12/2011 Document Reviewed: 10/31/2006 ExitCare Patient Information 2015 [employer]. This information is not intended to replace advice given to you by your health care provider. Make sure you discuss any questions you have with your health care provider.  

## 2014-06-13 NOTE — ED Notes (Signed)
Per Dr. Request, I changed out patient's soak basin with new 50% saline and 50% iodine. Should be complete soak per 11:42 pm. Dermabond is at bedside per Dr. Request.

## 2014-06-14 ENCOUNTER — Encounter (HOSPITAL_BASED_OUTPATIENT_CLINIC_OR_DEPARTMENT_OTHER): Payer: Self-pay | Admitting: Emergency Medicine

## 2014-06-18 ENCOUNTER — Encounter (HOSPITAL_COMMUNITY): Payer: Self-pay | Admitting: Emergency Medicine

## 2014-06-18 ENCOUNTER — Emergency Department (HOSPITAL_COMMUNITY)
Admission: EM | Admit: 2014-06-18 | Discharge: 2014-06-18 | Discharge status: Against Medical Advice | Payer: BC Managed Care – PPO | Attending: Emergency Medicine | Admitting: Emergency Medicine

## 2014-06-18 DIAGNOSIS — Y9389 Activity, other specified: Secondary | ICD-10-CM | POA: Diagnosis not present

## 2014-06-18 DIAGNOSIS — F17200 Nicotine dependence, unspecified, uncomplicated: Secondary | ICD-10-CM | POA: Diagnosis not present

## 2014-06-18 DIAGNOSIS — T401X4A Poisoning by heroin, undetermined, initial encounter: Secondary | ICD-10-CM | POA: Diagnosis not present

## 2014-06-18 DIAGNOSIS — Y9289 Other specified places as the place of occurrence of the external cause: Secondary | ICD-10-CM | POA: Diagnosis not present

## 2014-06-18 DIAGNOSIS — Z791 Long term (current) use of non-steroidal anti-inflammatories (NSAID): Secondary | ICD-10-CM | POA: Insufficient documentation

## 2014-06-18 DIAGNOSIS — R000 Tachycardia, unspecified: Secondary | ICD-10-CM | POA: Insufficient documentation

## 2014-06-18 DIAGNOSIS — Z792 Long term (current) use of antibiotics: Secondary | ICD-10-CM | POA: Insufficient documentation

## 2014-06-18 DIAGNOSIS — Z8659 Personal history of other mental and behavioral disorders: Secondary | ICD-10-CM | POA: Insufficient documentation

## 2014-06-18 LAB — CBC WITH DIFFERENTIAL/PLATELET
BASOS ABS: 0 10*3/uL (ref 0.0–0.1)
BASOS PCT: 0 % (ref 0–1)
Eosinophils Absolute: 0 10*3/uL (ref 0.0–0.7)
Eosinophils Relative: 0 % (ref 0–5)
HCT: 47.1 % (ref 39.0–52.0)
Hemoglobin: 16 g/dL (ref 13.0–17.0)
Lymphocytes Relative: 10 % — ABNORMAL LOW (ref 12–46)
Lymphs Abs: 1.3 10*3/uL (ref 0.7–4.0)
MCH: 30.4 pg (ref 26.0–34.0)
MCHC: 34 g/dL (ref 30.0–36.0)
MCV: 89.4 fL (ref 78.0–100.0)
Monocytes Absolute: 0.8 10*3/uL (ref 0.1–1.0)
Monocytes Relative: 6 % (ref 3–12)
NEUTROS PCT: 84 % — AB (ref 43–77)
Neutro Abs: 10.5 10*3/uL — ABNORMAL HIGH (ref 1.7–7.7)
PLATELETS: 239 10*3/uL (ref 150–400)
RBC: 5.27 MIL/uL (ref 4.22–5.81)
RDW: 12.9 % (ref 11.5–15.5)
WBC: 12.6 10*3/uL — ABNORMAL HIGH (ref 4.0–10.5)

## 2014-06-18 LAB — ETHANOL

## 2014-06-18 LAB — I-STAT TROPONIN, ED: Troponin i, poc: 0 ng/mL (ref 0.00–0.08)

## 2014-06-18 LAB — COMPREHENSIVE METABOLIC PANEL
ALT: 12 U/L (ref 0–53)
AST: 22 U/L (ref 0–37)
Albumin: 4.9 g/dL (ref 3.5–5.2)
Alkaline Phosphatase: 69 U/L (ref 39–117)
Anion gap: 14 (ref 5–15)
BUN: 17 mg/dL (ref 6–23)
CALCIUM: 9.1 mg/dL (ref 8.4–10.5)
CO2: 23 meq/L (ref 19–32)
CREATININE: 1.02 mg/dL (ref 0.50–1.35)
Chloride: 100 mEq/L (ref 96–112)
GLUCOSE: 239 mg/dL — AB (ref 70–99)
Potassium: 3.8 mEq/L (ref 3.7–5.3)
Sodium: 137 mEq/L (ref 137–147)
Total Bilirubin: 1 mg/dL (ref 0.3–1.2)
Total Protein: 7.7 g/dL (ref 6.0–8.3)

## 2014-06-18 LAB — BLOOD GAS, VENOUS
Acid-base deficit: 5.8 mmol/L — ABNORMAL HIGH (ref 0.0–2.0)
Bicarbonate: 22.4 mEq/L (ref 20.0–24.0)
O2 SAT: 71.7 %
PATIENT TEMPERATURE: 98.6
TCO2: 20.2 mmol/L (ref 0–100)
pCO2, Ven: 55.4 mmHg — ABNORMAL HIGH (ref 45.0–50.0)
pH, Ven: 7.231 — ABNORMAL LOW (ref 7.250–7.300)
pO2, Ven: 41.7 mmHg (ref 30.0–45.0)

## 2014-06-18 LAB — ACETAMINOPHEN LEVEL

## 2014-06-18 LAB — SALICYLATE LEVEL: Salicylate Lvl: <2 mg/dL — ABNORMAL LOW (ref 2.8–20.0)

## 2014-06-18 MED ORDER — SODIUM CHLORIDE 0.9 % IV SOLN: 1000.0000 mL | Freq: Once | INTRAVENOUS | Status: DC

## 2014-06-18 MED ORDER — SODIUM CHLORIDE 0.9 % IV SOLN
1000.0000 mL | Freq: Once | INTRAVENOUS | Status: AC
  Administered 2014-06-18: 1000 mL via INTRAVENOUS

## 2014-06-18 MED ORDER — SODIUM CHLORIDE 0.9 % IV SOLN: 1000.0000 mL | INTRAVENOUS | Status: DC

## 2014-06-18 NOTE — ED Provider Notes (Signed)
CSN: [id number]     Arrival date & time 06/18/14  0207 History   First MD Initiated Contact with Patient 06/18/14 [phone]     Chief Complaint  Patient presents with  . Drug Overdose    (Consider location/radiation/quality/duration/timing/severity/associated sxs/prior Treatment) HPI Comments: Patient is a 23 year old male with a history of anxiety, depression, continuous opiate abuse and polysubstance abuse who presents to the emergency department by EMS. Patient was found in a car unresponsive by [employer]. Patient, per EMS, had agonal breathing on EMS arrival. Patient was given 2 mg of Narcan after which time patient became alert and responsive. On arrival to the emergency department, patient is alert and oriented x3. He states that he used 0.1 g IV heroin. Patient states he last used heroin 6 months ago. Patient with no complaints at this time; no chest pain, shortness of breath, abdominal pain, nausea, vomiting, or weakness.  Patient is a 23 y.o. male presenting with Overdose. The history is provided by the patient. No language interpreter was used.  Drug Overdose Pertinent negatives include no abdominal pain, chest pain, nausea or vomiting.    Past Medical History  Diagnosis Date  . Anxiety   . Panic attacks   . Depression   . Polysubstance abuse   . Opiate abuse, continuous    Past Surgical History  Procedure Laterality Date  . Extraction of wisdom teeth    . Intraoperative arteriogram  07/22/2012    Procedure: INTRA OPERATIVE ARTERIOGRAM;  Surgeon: Sherren Kerns, MD;  Location: [facility] OR;  Service: Vascular;  Laterality: Left;  arch aortagram, second order catheterization right subclavian artery, ultrasound left groin  . Insertion of dialysis catheter  07/22/2012    Procedure: INSERTION OF DIALYSIS CATHETER;  Surgeon: Sherren Kerns, MD;  Location: [facility] OR;  Service: Vascular;  Laterality: Right;  . Fasciotomy  07/22/2012    Procedure: FASCIOTOMY;  Surgeon: Nadara Mustard, MD;  Location:  [facility] OR;  Service: Orthopedics;  Laterality: Right;  . I&d extremity  07/24/2012    Procedure: IRRIGATION AND DEBRIDEMENT EXTREMITY;  Surgeon: Nadara Mustard, MD;  Location: [facility] OR;  Service: Orthopedics;  Laterality: Right;  Right Arm Irrigation and Debridement, VAC Application  . Application of wound vac  07/24/2012    Procedure: APPLICATION OF WOUND VAC;  Surgeon: Nadara Mustard, MD;  Location: [facility] OR;  Service: Orthopedics;  Laterality: Right;  . Insertion of dialysis catheter  07/31/2012    Procedure: INSERTION OF DIALYSIS CATHETER;  Surgeon: Nada Libman, MD;  Location: [facility] OR;  Service: Vascular;  Laterality: Left;  internal jugular   Family History  Problem Relation Age of Onset  . Hypertension Father    History  Substance Use Topics  . Smoking status: Current Every Day Smoker -- 1.00 packs/day for 10 years    Types: Cigarettes  . Smokeless tobacco: Not on file  . Alcohol Use: Yes    Review of Systems  Respiratory: Negative for shortness of breath.   Cardiovascular: Negative for chest pain.  Gastrointestinal: Negative for nausea, vomiting and abdominal pain.  Neurological:       Unresponsive when found by [employer]  All other systems reviewed and are negative.   Allergies  Review of patient's allergies indicates no known allergies.  Home Medications   Prior to Admission medications   Medication Sig Start Date End Date Taking? Authorizing Provider  ciprofloxacin (CIPRO) 500 MG tablet Take 1 tablet (500 mg total) by mouth 2 (two) times daily. One po  bid x 7 days 06/13/14  Yes April K Palumbo-Rasch, MD  ibuprofen (ADVIL,MOTRIN) 800 MG tablet Take 1 tablet (800 mg total) by mouth 3 (three) times daily. 06/13/14  Yes April K Palumbo-Rasch, MD   BP 158/109  Pulse 126  Temp(Src) 97.4 F (36.3 C) (Oral)  Resp 16  SpO2 100%  Physical Exam  Nursing note and vitals reviewed. Constitutional: He is oriented to person, place, and time. He appears well-developed and well-nourished. No  distress.  Nontoxic/nonseptic appearing  HENT:  Head: Normocephalic and atraumatic.  Mouth/Throat: Oropharynx is clear and moist. No oropharyngeal exudate.  Eyes: Conjunctivae and EOM are normal. No scleral icterus.  Neck: Normal range of motion.  Cardiovascular: Regular rhythm and normal heart sounds.  Tachycardia present.   Pulmonary/Chest: Effort normal and breath sounds normal. No respiratory distress. He has no wheezes. He has no rales.  Chest expansion symmetric  Abdominal: Soft. He exhibits no distension. There is no tenderness. There is no rebound.  Soft, nontender  Musculoskeletal: Normal range of motion.  Neurological: He is alert and oriented to person, place, and time. He exhibits normal muscle tone. Coordination normal.  GCS 15. Speech is goal oriented. Patient answers questions appropriately and follows simple commands. He moves extremities without ataxia.  Skin: Skin is warm and dry. No rash noted. He is not diaphoretic. No erythema. No pallor.  Psychiatric: He has a normal mood and affect. His behavior is normal.    ED Course  Procedures (including critical care time)  Labs Review Labs Reviewed  CBC WITH DIFFERENTIAL - Abnormal; Notable for the following:    WBC 12.6 (*)    Neutrophils Relative % 84 (*)    Neutro Abs 10.5 (*)    Lymphocytes Relative 10 (*)    All other components within normal limits  COMPREHENSIVE METABOLIC PANEL - Abnormal; Notable for the following:    Glucose, Bld 239 (*)    All other components within normal limits  SALICYLATE LEVEL - Abnormal; Notable for the following:    Salicylate Lvl <2.0 (*)    All other components within normal limits  BLOOD GAS, VENOUS - Abnormal; Notable for the following:    pH, Ven 7.231 (*)    pCO2, Ven 55.4 (*)    Acid-base deficit 5.8 (*)    All other components within normal limits  ACETAMINOPHEN LEVEL  ETHANOL  URINE RAPID DRUG SCREEN (HOSP PERFORMED)  URINALYSIS, ROUTINE W REFLEX MICROSCOPIC  I-STAT  TROPOININ, ED  I-STAT CG4 LACTIC ACID, ED    Imaging Review No results found.   EKG Interpretation None      MDM   Final diagnoses:  Heroin overdose, undetermined intent, initial encounter    23 year old male presents to the emergency department via EMS. Patient found unresponsive by [employer] and responded to 2 mg Narcan. On arrival, patient alert and oriented x3 and without any complaints. Physical exam is significant for tachycardia.  Labs today significant for mild leukocytosis of 12.3 as well as elevated lactate with pH of 7.231. Tx initiated on ED arrival with IVF. While labs pending, however, patient and friend (who also presented to the ED for overdose) eloped from the department prior to discharge or reevaluation. Security attempted to look for the patient unsuccessfully. Patient to be signed out AMA.   Filed Vitals:   06/18/14 0207 06/18/14 0208  BP:  158/109  Pulse:  126  Temp:  97.4 F (36.3 C)  TempSrc:  Oral  Resp:  16  SpO2: 100% 100%  Antony Madura, PA-C 06/18/14 [phone]

## 2014-06-18 NOTE — ED Notes (Signed)
Pt ripped out IV and ran out of the EMS bay and down [facility].

## 2014-06-18 NOTE — ED Notes (Signed)
Pt was with his friend that is in room RES A, they were found in the car unresponsive by [employer], he was agnonal breathing per EMS, they bagged him and gave him a total of 2 mg of narcan, pt became responsive at that time. Pt denies SI/HI. There was a third party that left with the drugs after EMS was called.

## 2014-06-18 NOTE — ED Provider Notes (Signed)
Medical screening examination/treatment/procedure(s) were performed by non-physician practitioner and as supervising physician I was immediately available for consultation/collaboration.   EKG Interpretation   Date/Time:  Wednesday June 18 2014 02:05:13 EDT Ventricular Rate:  122 PR Interval:  140 QRS Duration: 107 QT Interval:  335 QTC Calculation: 477 R Axis:   110 Text Interpretation:  Sinus tachycardia Right axis deviation RSR' in V1 or  V2, probably normal variant Borderline prolonged QT interval rate faster  since previous  Confirmed by Dempsy Damiano  MD, Revis Whalin ([id number]) on 06/18/2014 7:28:06  AM        Richardean Canal, MD 06/18/14 [phone]

## 2014-06-18 NOTE — ED Notes (Signed)
Bed: RESB Expected date: 06/18/14 Expected time: 1:56 AM Means of arrival: Ambulance Comments: Heroin overdose

## 2014-06-20 LAB — I-STAT CG4 LACTIC ACID, ED: Lactic Acid, Venous: 2.65 mmol/L — ABNORMAL HIGH (ref 0.5–2.2)

## 2014-08-13 ENCOUNTER — Ambulatory Visit (INDEPENDENT_AMBULATORY_CARE_PROVIDER_SITE_OTHER): Payer: BC Managed Care – PPO | Admitting: Family Medicine

## 2014-08-13 VITALS — BP 110/78 | HR 82 | Temp 98.3°F | Resp 16 | Ht 66.5 in | Wt 148.0 lb

## 2014-08-13 DIAGNOSIS — M7740 Metatarsalgia, unspecified foot: Secondary | ICD-10-CM

## 2014-08-13 MED ORDER — PREDNISONE 20 MG PO TABS: 40.0000 mg | ORAL_TABLET | Freq: Every day | ORAL | Status: AC

## 2014-08-13 NOTE — Progress Notes (Signed)
Subjective:   This chart was scribed for Martin SidleKurt Lauenstein, MD by Arlan OrganAshley Leger, [facility] Scribe. This patient was seen in room 1 and the patient's care was started 8:23 PM.    Patient ID: Martin Chapman Croke, male    DOB: [birth year], 23 y.o.   MRN: [id number]  HPI  Chief Complaint  Patient presents with   Foot Pain    right x 3 days     HPI Comments: Martin Chapman Liguori is a 23 y.o. male who presents to [facility] complaining of constant, moderate R foot pain x 3 days that has progressively worsened. He denies any recent injury or trauma but admits to working on his feet often throughout the day. Mr. Martin Chapman wears boots when working and recently purchased some insoles to help with support. He has not tried any OTC medications or home remedies to help manage symptoms. No recent fever or chills. He denies any numbness or loss of sensation. No known allergies to medications. No other concerns this visit.  Pt currently works in [employer].  Patient Active Problem List   Diagnosis Date Noted   Heroin dependence 09/23/2013   MDD (major depressive disorder) 09/22/2013   Compartment syndrome of upper extremity 07/23/2012   Rhabdomyolysis 07/23/2012   Past Medical History  Diagnosis Date   Anxiety    Panic attacks    Depression    Polysubstance abuse    Opiate abuse, continuous    Past Surgical History  Procedure Laterality Date   Extraction of wisdom teeth     Intraoperative arteriogram  07/22/2012    Procedure: INTRA OPERATIVE ARTERIOGRAM;  Surgeon: Sherren Kernsharles E Fields, MD;  [facility];  Service: Vascular;  Laterality: Left;  arch aortagram, second order catheterization right subclavian artery, ultrasound left groin   Insertion of dialysis catheter  07/22/2012    Procedure: INSERTION OF DIALYSIS CATHETER;  Surgeon: Sherren Kernsharles E Fields, MD;  [facility];  Service: Vascular;  Laterality: Right;   Fasciotomy  07/22/2012   Procedure: FASCIOTOMY;  Surgeon: Nadara MustardMarcus V Duda, MD;  Location: [facility] OR;  Service: Orthopedics;  Laterality: Right;   I&d extremity  07/24/2012    Procedure: IRRIGATION AND DEBRIDEMENT EXTREMITY;  Surgeon: Nadara MustardMarcus V Duda, MD;  Location: [facility] OR;  Service: Orthopedics;  Laterality: Right;  Right Arm Irrigation and Debridement, VAC Application   Application of wound vac  07/24/2012    Procedure: APPLICATION OF WOUND VAC;  Surgeon: Nadara MustardMarcus V Duda, MD;  Location: [facility] OR;  Service: Orthopedics;  Laterality: Right;   Insertion of dialysis catheter  07/31/2012    Procedure: INSERTION OF DIALYSIS CATHETER;  Surgeon: Nada LibmanVance W Brabham, MD;  Location: [facility] OR;  Service: Vascular;  Laterality: Left;  internal jugular   No Known Allergies Prior to Admission medications   Medication Sig Start Date End Date Taking? Authorizing Provider  ciprofloxacin (CIPRO) 500 MG tablet Take 1 tablet (500 mg total) by mouth 2 (two) times daily. One po bid x 7 days 06/13/14   April K Palumbo-Rasch, MD  ibuprofen (ADVIL,MOTRIN) 800 MG tablet Take 1 tablet (800 mg total) by mouth 3 (three) times daily. 06/13/14   April Smitty CordsK Palumbo-Rasch, MD    Review of Systems  Constitutional: Negative for fever and chills.  Musculoskeletal: Positive for arthralgias.  Neurological: Negative for weakness and numbness.     Objective:  Physical Exam  Constitutional: He is oriented to person, place, and time. He appears well-developed and well-nourished.  HENT:  Head: Normocephalic.  Eyes: EOM are normal.  Neck: Normal range of motion.  Pulmonary/Chest: Effort normal.  Abdominal: He exhibits no distension.  Musculoskeletal: Normal range of motion.  Neurological: He is alert and oriented to person, place, and time.  Psychiatric: He has a normal mood and affect.  Nursing note and vitals reviewed. tender metatarsals right foot  Assessment & Plan:   I personally performed the services described in this documentation, which was scribed in my  presence. The recorded information has been reviewed and is accurate.  Metatarsalgia, unspecified laterality - Plan: predniSONE (DELTASONE) 20 MG tablet  Signed, Martin SidleKurt Lauenstein, MD

## 2014-08-13 NOTE — Patient Instructions (Signed)
metatarsalgia

## 2014-12-14 ENCOUNTER — Encounter (HOSPITAL_COMMUNITY): Payer: Self-pay | Admitting: Nurse Practitioner

## 2014-12-14 ENCOUNTER — Emergency Department (HOSPITAL_COMMUNITY)
Admission: EM | Admit: 2014-12-14 | Discharge: 2014-12-14 | Discharge status: Against Medical Advice | Payer: Self-pay | Attending: Emergency Medicine | Admitting: Emergency Medicine

## 2014-12-14 DIAGNOSIS — Z008 Encounter for other general examination: Secondary | ICD-10-CM | POA: Insufficient documentation

## 2014-12-14 DIAGNOSIS — Z720 Tobacco use: Secondary | ICD-10-CM | POA: Insufficient documentation

## 2014-12-14 HISTORY — DX: Compartment syndrome, unspecified, initial encounter: T79.A0XA

## 2014-12-14 LAB — RAPID URINE DRUG SCREEN, HOSP PERFORMED
AMPHETAMINES: NOT DETECTED
Barbiturates: NOT DETECTED
Benzodiazepines: NOT DETECTED
Cocaine: POSITIVE — AB
OPIATES: POSITIVE — AB
TETRAHYDROCANNABINOL: POSITIVE — AB

## 2014-12-14 LAB — COMPREHENSIVE METABOLIC PANEL
ALT: 115 U/L — ABNORMAL HIGH (ref 0–53)
AST: 58 U/L — ABNORMAL HIGH (ref 0–37)
Albumin: 4.4 g/dL (ref 3.5–5.2)
Alkaline Phosphatase: 78 U/L (ref 39–117)
Anion gap: 8 (ref 5–15)
BUN: 15 mg/dL (ref 6–23)
CHLORIDE: 102 mmol/L (ref 96–112)
CO2: 29 mmol/L (ref 19–32)
CREATININE: 0.75 mg/dL (ref 0.50–1.35)
Calcium: 9 mg/dL (ref 8.4–10.5)
GFR calc Af Amer: >90 mL/min (ref >90)
GFR calc non Af Amer: >90 mL/min (ref >90)
Glucose, Bld: 118 mg/dL — ABNORMAL HIGH (ref 70–99)
Potassium: 3.7 mmol/L (ref 3.5–5.1)
SODIUM: 139 mmol/L (ref 135–145)
TOTAL PROTEIN: 6.9 g/dL (ref 6.0–8.3)
Total Bilirubin: 1.4 mg/dL — ABNORMAL HIGH (ref 0.3–1.2)

## 2014-12-14 LAB — CBC
HCT: 42.7 % (ref 39.0–52.0)
Hemoglobin: 14.8 g/dL (ref 13.0–17.0)
MCH: 29.6 pg (ref 26.0–34.0)
MCHC: 34.7 g/dL (ref 30.0–36.0)
MCV: 85.4 fL (ref 78.0–100.0)
Platelets: 307 10*3/uL (ref 150–400)
RBC: 5 MIL/uL (ref 4.22–5.81)
RDW: 12.9 % (ref 11.5–15.5)
WBC: 9.5 10*3/uL (ref 4.0–10.5)

## 2014-12-14 LAB — ETHANOL: Alcohol, Ethyl (B): <5 mg/dL (ref 0–9)

## 2014-12-14 LAB — SALICYLATE LEVEL: Salicylate Lvl: <4 mg/dL (ref 2.8–20.0)

## 2014-12-14 LAB — ACETAMINOPHEN LEVEL

## 2014-12-14 NOTE — ED Notes (Signed)
Pt left AMA after being asked to follow detox protocol and put on scrubs and have his belongings held at the desk.  Pt was extremely nervous and became agitated because he "doesn't want to detox; just wants clean pee for [facility]"  Pt refused to sign AMA and ambulated to the ER exit.

## 2014-12-14 NOTE — Progress Notes (Addendum)
CSW contacted [facility] to see about available detox beds, [facility] states they do have beds, they will check to see if they take the patient's insurance and call back.  [facility], patient belongings were checked in and he had a syringe in his jacket, "that I forgot I had."  [facility] he must wait 7 days to be readmitted to the program.  CSW called [facility] staff who verified the policy and story.    [facility] Faylinn Schwenn Macy MisLCSW,LCAS [facility] CSW [phone]

## 2014-12-14 NOTE — ED Notes (Signed)
He reports he was just discharged from rehab and relapsed into using heroin. He wants to return to [facility] for rehab but needs detox first. He denies any physical complaints or pain. He is A&Ox4, resp e/u

## 2021-08-18 ENCOUNTER — Emergency Department (HOSPITAL_BASED_OUTPATIENT_CLINIC_OR_DEPARTMENT_OTHER): Payer: Self-pay | Admitting: Radiology

## 2021-08-18 ENCOUNTER — Emergency Department (HOSPITAL_BASED_OUTPATIENT_CLINIC_OR_DEPARTMENT_OTHER): Payer: Self-pay

## 2021-08-18 ENCOUNTER — Emergency Department (HOSPITAL_BASED_OUTPATIENT_CLINIC_OR_DEPARTMENT_OTHER)
Admission: EM | Admit: 2021-08-18 | Discharge: 2021-08-18 | Disposition: A | Discharge status: Home / Self Care | Payer: Self-pay | Attending: Emergency Medicine | Admitting: Emergency Medicine

## 2021-08-18 ENCOUNTER — Other Ambulatory Visit: Payer: Self-pay

## 2021-08-18 ENCOUNTER — Encounter (HOSPITAL_BASED_OUTPATIENT_CLINIC_OR_DEPARTMENT_OTHER): Payer: Self-pay

## 2021-08-18 DIAGNOSIS — S60221A Contusion of right hand, initial encounter: Secondary | ICD-10-CM | POA: Insufficient documentation

## 2021-08-18 DIAGNOSIS — W11XXXA Fall on and from ladder, initial encounter: Secondary | ICD-10-CM | POA: Insufficient documentation

## 2021-08-18 DIAGNOSIS — S8991XA Unspecified injury of right lower leg, initial encounter: Secondary | ICD-10-CM | POA: Insufficient documentation

## 2021-08-18 DIAGNOSIS — W19XXXA Unspecified fall, initial encounter: Secondary | ICD-10-CM

## 2021-08-18 DIAGNOSIS — S0083XA Contusion of other part of head, initial encounter: Secondary | ICD-10-CM | POA: Insufficient documentation

## 2021-08-18 DIAGNOSIS — Z87891 Personal history of nicotine dependence: Secondary | ICD-10-CM | POA: Insufficient documentation

## 2021-08-18 DIAGNOSIS — M25561 Pain in right knee: Secondary | ICD-10-CM | POA: Insufficient documentation

## 2021-08-18 DIAGNOSIS — S299XXA Unspecified injury of thorax, initial encounter: Secondary | ICD-10-CM | POA: Insufficient documentation

## 2021-08-18 DIAGNOSIS — R52 Pain, unspecified: Secondary | ICD-10-CM

## 2021-08-18 MED ORDER — KETOROLAC TROMETHAMINE 30 MG/ML IJ SOLN
30.0000 mg | Freq: Once | INTRAMUSCULAR | Status: AC
  Administered 2021-08-18: 30 mg via INTRAMUSCULAR
  Filled 2021-08-18: qty 1

## 2021-08-18 NOTE — ED Provider Notes (Signed)
[facility]ERG[facility] DEPT Provider Note   CSN: [id number] Arrival date & time: 08/18/21  1237     History Chief Complaint  Patient presents with   Lytle Michaels    Martin Chapman is a 30 y.o. male with a past medical history of polysubstance abuse who presents to the ED complaining of fall onset prior to arrival. Patient reports that he stepped off the roof onto a ladder, but fell to the ground about 10 feet.  He has associated bruising noted to upper left forehead, right leg pain, right knee swelling, right knee pain, chest wall pain, abrasion to left forehead, and bruising to left palm.  He has not tried any medications for his symptoms.  He denies LOC, dizziness, lightheadedness, headache, vision changes, chest pain, shortness of breath, abdominal pain, nausea, or vomiting.     The history is provided by the patient. No language interpreter was used.  Fall Pertinent negatives include no chest pain, no abdominal pain and no shortness of breath.      Past Medical History:  Diagnosis Date   Anxiety    Compartment syndrome ([place])    Depression    Opiate abuse, continuous ([place])    Panic attacks    Polysubstance abuse [facility])     Patient Active Problem List   Diagnosis Date Noted   Heroin dependence ([place]) 09/23/2013   MDD (major depressive disorder) 09/22/2013   Compartment syndrome of upper extremity ([place]) 07/23/2012   Rhabdomyolysis 07/23/2012    Past Surgical History:  Procedure Laterality Date   APPLICATION OF WOUND VAC  07/24/2012   Procedure: APPLICATION OF WOUND VAC;  Surgeon: Newt Minion, MD;  Location: [place];  Service: Orthopedics;  Laterality: Right;   extraction of wisdom teeth     FASCIOTOMY  07/22/2012   Procedure: FASCIOTOMY;  Surgeon: Newt Minion, MD;  Location: [place];  Service: Orthopedics;  Laterality: Right;   I & D EXTREMITY  07/24/2012   Procedure: IRRIGATION AND DEBRIDEMENT EXTREMITY;  Surgeon: Newt Minion, MD;  Location: [place];   Service: Orthopedics;  Laterality: Right;  Right Arm Irrigation and Debridement, VAC Application   INSERTION OF DIALYSIS CATHETER  07/22/2012   Procedure: INSERTION OF DIALYSIS CATHETER;  Surgeon: Elam Dutch, MD;  Location: [place];  Service: Vascular;  Laterality: Right;   INSERTION OF DIALYSIS CATHETER  07/31/2012   Procedure: INSERTION OF DIALYSIS CATHETER;  Surgeon: Serafina Mitchell, MD;  Location: [place];  Service: Vascular;  Laterality: Left;  internal jugular   INTRAOPERATIVE ARTERIOGRAM  07/22/2012   Procedure: INTRA OPERATIVE ARTERIOGRAM;  Surgeon: Elam Dutch, MD;  Location: [facility] OR;  Service: Vascular;  Laterality: Left;  arch aortagram, second order catheterization right subclavian artery, ultrasound left groin       Family History  Problem Relation Age of Onset   Hypertension Father     Social History   Tobacco Use   Smoking status: Former    Packs/day: 1.00    Years: 10.00    Pack years: 10.00    Types: Cigarettes   Smokeless tobacco: Never  Vaping Use   Vaping Use: Every day  Substance Use Topics   Alcohol use: Not Currently   Drug use: Not Currently    Types: IV    Comment: pills, opiates    Home Medications Prior to Admission medications   Medication Sig Start Date End Date Taking? Authorizing Provider  ciprofloxacin (CIPRO) 500 MG tablet Take 1 tablet (500 mg total)  by mouth 2 (two) times daily. One po bid x 7 days 06/13/14   Palumbo, April, MD  ibuprofen (ADVIL,MOTRIN) 800 MG tablet Take 1 tablet (800 mg total) by mouth 3 (three) times daily. Patient not taking: Reported on 08/18/2021 06/13/14   Palumbo, April, MD  predniSONE (DELTASONE) 20 MG tablet Take 2 tablets (40 mg total) by mouth daily. 08/13/14   Elvina Sidle, MD    Allergies    Patient has no known allergies.  Review of Systems   Review of Systems  Eyes:  Negative for visual disturbance.  Respiratory:  Negative for shortness of breath.   Cardiovascular:  Negative for chest pain.        + Chest wall pain  Gastrointestinal:  Negative for abdominal pain, nausea and vomiting.  Musculoskeletal:  Positive for arthralgias and joint swelling. Negative for back pain and neck pain.  Skin:  Positive for color change and wound (abrasion to left forehead).  Neurological:  Negative for dizziness, syncope and light-headedness.  All other systems reviewed and are negative.  Physical Exam Updated Vital Signs BP 119/82 (BP Location: Right Arm)   Pulse 73   Temp 98.2 F (36.8 C) (Oral)   Resp 16   Ht 5\' 7"  (1.702 m)   Wt 68 kg   SpO2 99%   BMI 23.49 kg/m   Physical Exam Vitals and nursing note reviewed.  Constitutional:      General: He is not in acute distress.    Appearance: He is not diaphoretic.  HENT:     Head: Normocephalic and atraumatic.     Right Ear: Tympanic membrane, ear canal and external ear normal.     Left Ear: Ear canal and external ear normal.     Nose: Nose normal.     Mouth/Throat:     Mouth: Mucous membranes are moist.     Pharynx: Oropharynx is clear. No oropharyngeal exudate or posterior oropharyngeal erythema.  Eyes:     General: No scleral icterus.    Extraocular Movements: Extraocular movements intact.     Conjunctiva/sclera: Conjunctivae normal.  Cardiovascular:     Rate and Rhythm: Normal rate and regular rhythm.     Pulses: Normal pulses.     Heart sounds: Normal heart sounds.  Pulmonary:     Effort: Pulmonary effort is normal. No respiratory distress.     Breath sounds: Normal breath sounds. No wheezing.  Chest:     Chest wall: Tenderness present. No deformity or crepitus.     Comments: Chest wall tenderness noted to left sternal border.  No overlying skin changes.  No obvious deformities, step-off, ecchymosis. Abdominal:     General: Bowel sounds are normal.     Palpations: Abdomen is soft. There is no mass.     Tenderness: There is no abdominal tenderness. There is no guarding or rebound.  Musculoskeletal:     Right wrist:  Normal.     Left wrist: Normal.     Right hand: Tenderness present. No swelling, deformity, lacerations or bony tenderness. Normal range of motion.     Left hand: Normal.     Cervical back: Normal range of motion and neck supple.     Right knee: Swelling and effusion present. No deformity, erythema, ecchymosis, lacerations, bony tenderness or crepitus. Decreased range of motion. Tenderness present. No patellar tendon tenderness.     Left knee: Normal.     Comments: Ecchymosis noted to right thenar eminence.  Minimal tenderness to palpation to area.  Patient  with tenderness with palpation to the right thenar eminence with thumb to pinky opposition. Tenderness noted with resisted flexion of right first digit. No tenderness to palpation with resisted extension of first digit.  Moderate effusion noted to medial/superior aspect of right patella.  No patellar tendon tenderness.  Mild tenderness to palpation into medial quadriceps tendon.  No overlying skin changes.  No tenderness to palpation to bilateral calves.  DP, PT pulses intact bilaterally.  Strength and sensation intact in bilateral upper and lower extremities.   Skin:    General: Skin is warm and dry.     Findings: Abrasion present. No ecchymosis, erythema or laceration.     Comments: Abrasion noted to left upper forehead with an upper hairline.  No surrounding ecchymosis or erythema.    Neurological:     Mental Status: He is alert.     Sensory: Sensation is intact.     Motor: Motor function is intact.    ED Results / Procedures / Treatments   Labs (all labs ordered are listed, but only abnormal results are displayed) Labs Reviewed - No data to display  EKG None  Radiology DG Chest 1 View  Result Date: 08/18/2021 CLINICAL DATA:  Larey Seat off ladder from 10 ft height.  Chest pain. EXAM: CHEST  1 VIEW COMPARISON:  05/07/2013 FINDINGS: The heart size and mediastinal contours are within normal limits. No evidence of pneumothorax or  hemothorax. Both lungs are clear. The visualized skeletal structures are unremarkable. IMPRESSION: Negative. No active cardiopulmonary disease. Electronically Signed   By: Danae Orleans M.D.   On: 08/18/2021 14:09   DG Knee 2 Views Right  Result Date: 08/18/2021 CLINICAL DATA:  Pain.  Status post fall. EXAM: RIGHT KNEE - 1-2 VIEW COMPARISON:  None. FINDINGS: No evidence of fracture, dislocation, or joint effusion. No evidence of arthropathy or other focal bone abnormality. Soft tissues are unremarkable. IMPRESSION: Negative. Electronically Signed   By: Signa Kell M.D.   On: 08/18/2021 14:08   DG Hand Complete Right  Result Date: 08/18/2021 CLINICAL DATA:  Fall EXAM: RIGHT HAND - COMPLETE 3+ VIEW COMPARISON:  None. FINDINGS: There is no evidence of acute fracture or dislocation. No significant arthropathy. Normal alignment. IMPRESSION: Negative right hand radiographs. Electronically Signed   By: Caprice Renshaw M.D.   On: 08/18/2021 14:11   DG Hip Unilat W or Wo Pelvis 2-3 Views Right  Result Date: 08/18/2021 CLINICAL DATA:  Pain EXAM: DG HIP (WITH OR WITHOUT PELVIS) 2-3V RIGHT COMPARISON:  None. FINDINGS: There is no evidence of acute fracture or dislocation. There is no significant arthritis. IMPRESSION: Negative right hip radiographs. Electronically Signed   By: Caprice Renshaw M.D.   On: 08/18/2021 14:14    Procedures .Ortho Injury Treatment  Date/Time: 08/18/2021 2:57 PM Performed by: Chestine Spore A, PA-C Authorized by: Karenann Cai, PA-C   Consent:    Consent obtained:  Verbal   Consent given by:  Patient   Risks discussed:  Restricted joint movementInjury location: knee Location details: right knee Injury type: soft tissue Pre-procedure neurovascular assessment: neurovascularly intact Immobilization: crutches (Knee immobilizer) Splint Applied by: ED Tech Post-procedure neurovascular assessment: post-procedure neurovascularly intact     Medications Ordered in ED Medications   ketorolac (TORADOL) 30 MG/ML injection 30 mg (30 mg Intramuscular Given 08/18/21 1350)    ED Course  I have reviewed the triage vital signs and the nursing notes.  Pertinent labs & imaging results that were available during my care of the patient were  reviewed by me and considered in my medical decision making (see chart for details).    MDM Rules/Calculators/A&P                          Pt with right knee pain/swelling, right hand pain, and abrasion to left forehead status post fall occurring prior to arrival. On exam, patient with ecchymosis noted to right thenar eminence, moderate swelling noted to right knee, and abrasion noted to left forehead.  Right hip, right hand, chest x-ray, right knee x-rays negative for acute fracture or dislocation. Patient likely with musculoskeletal pain following injury.  Patient given Toradol in the ED.   At discharge, patient discussed how he would treat pain at home and voiced concerns for that.  Discussed with patient and significant other in room that he can alternate Tylenol and ibuprofen as needed as well as ice or heat for his pain.  Thoroughly discussed with patient that due to no fractures or dislocation on imaging that his symptoms are likely musculoskeletal and would benefit from supportive care.  Patient and significant other acknowledged and verbalized understanding. Supportive care and return precautions discussed with patient and significant other.  Return precautions given increased swelling, inability to walk, fever.  Patient appears safe for discharge at this time.  Follow-up as indicated in discharge paperwork.   Final Clinical Impression(s) / ED Diagnoses Final diagnoses:  Pain  Acute pain of right knee  Contusion of right palm, initial encounter    Rx / DC Orders ED Discharge Orders     None        Amoree Newlon A, PA-C 08/18/21 2230    Lacretia Leigh, MD 08/25/21 1731

## 2021-08-18 NOTE — Discharge Instructions (Addendum)
Call Dr. Steward Drone, Orthopedist today to schedule an appointment in regards to todays visit.  You may call [facility] orthopedic office today to set up an appointment in regards to today's visit.  You may take over the counter 600 mg Ibuprofen every 6 hours and/or 1,000 mg tylenol every 6 hours as directed. You may apply ice to affected area for up to 15 minutes at a time. Return to the Emergency Department if you are experiencing increasing or worsening redness, swelling, unable to walk, fever.

## 2021-08-18 NOTE — ED Triage Notes (Signed)
Pt stepping off of roof onto ladder but fell to ground ~10 ft. No LOC. Hematoma noted to upper left forehead. C/o right leg pain that begins in right quad and radiates up towards groin/buttocks. Some swelling noted to right knee. Not tender to touch. Unable to tolerate weight on right leg. +2 pedal pulses.

## 2021-08-18 NOTE — ED Notes (Signed)
Patient transported to X-ray 

## 2021-08-18 NOTE — ED Notes (Signed)
ED Provider at bedside.
